# Patient Record
Sex: Female | Born: 1980
Health system: Southern US, Community
[De-identification: ages and names within clinical notes are randomized; demographics above are authoritative.]

## PROBLEM LIST (undated history)

## (undated) DIAGNOSIS — E039 Hypothyroidism, unspecified: Secondary | ICD-10-CM

## (undated) DIAGNOSIS — Z8571 Personal history of Hodgkin lymphoma: Secondary | ICD-10-CM

## (undated) DIAGNOSIS — Z9289 Personal history of other medical treatment: Secondary | ICD-10-CM

## (undated) DIAGNOSIS — Z853 Personal history of malignant neoplasm of breast: Secondary | ICD-10-CM

## (undated) DIAGNOSIS — G43909 Migraine, unspecified, not intractable, without status migrainosus: Secondary | ICD-10-CM

## (undated) DIAGNOSIS — I10 Essential (primary) hypertension: Secondary | ICD-10-CM

## (undated) DIAGNOSIS — N649 Disorder of breast, unspecified: Secondary | ICD-10-CM

## (undated) DIAGNOSIS — E559 Vitamin D deficiency, unspecified: Secondary | ICD-10-CM

## (undated) DIAGNOSIS — I499 Cardiac arrhythmia, unspecified: Secondary | ICD-10-CM

## (undated) DIAGNOSIS — Z9221 Personal history of antineoplastic chemotherapy: Secondary | ICD-10-CM

## (undated) HISTORY — PX: WISDOM TOOTH EXTRACTION: SHX21

## (undated) HISTORY — DX: Disorder of breast, unspecified: N64.9

## (undated) HISTORY — DX: Vitamin D deficiency, unspecified: E55.9

## (undated) HISTORY — DX: Hypothyroidism, unspecified: E03.9

---
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
## 2009-04-06 ENCOUNTER — Other Ambulatory Visit: Admission: RE | Admit: 2009-04-06 | Discharge: 2009-04-06 | Payer: Self-pay | Admitting: Obstetrics and Gynecology

## 2010-04-13 ENCOUNTER — Other Ambulatory Visit: Admission: RE | Admit: 2010-04-13 | Discharge: 2010-04-13 | Payer: Self-pay | Admitting: Obstetrics and Gynecology

## 2011-06-29 ENCOUNTER — Other Ambulatory Visit: Payer: Self-pay | Admitting: Adult Health

## 2011-06-29 ENCOUNTER — Other Ambulatory Visit (HOSPITAL_COMMUNITY)
Admission: RE | Admit: 2011-06-29 | Discharge: 2011-06-29 | Disposition: A | Discharge status: Home / Self Care | Payer: PRIVATE HEALTH INSURANCE | Source: Ambulatory Visit | Attending: Obstetrics and Gynecology | Admitting: Obstetrics and Gynecology

## 2011-06-29 DIAGNOSIS — Z01419 Encounter for gynecological examination (general) (routine) without abnormal findings: Secondary | ICD-10-CM | POA: Insufficient documentation

## 2011-07-05 ENCOUNTER — Ambulatory Visit (HOSPITAL_COMMUNITY)
Admission: RE | Admit: 2011-07-05 | Discharge: 2011-07-05 | Disposition: A | Discharge status: Home / Self Care | Payer: PRIVATE HEALTH INSURANCE | Source: Ambulatory Visit | Attending: Adult Health | Admitting: Adult Health

## 2011-07-05 DIAGNOSIS — Z8571 Personal history of Hodgkin lymphoma: Secondary | ICD-10-CM | POA: Insufficient documentation

## 2011-07-05 DIAGNOSIS — E042 Nontoxic multinodular goiter: Secondary | ICD-10-CM | POA: Insufficient documentation

## 2011-07-13 ENCOUNTER — Other Ambulatory Visit: Payer: Self-pay | Admitting: Adult Health

## 2011-07-13 DIAGNOSIS — E042 Nontoxic multinodular goiter: Secondary | ICD-10-CM

## 2011-07-16 ENCOUNTER — Encounter (HOSPITAL_COMMUNITY)
Admission: RE | Admit: 2011-07-16 | Discharge: 2011-07-16 | Disposition: A | Discharge status: Home / Self Care | Payer: PRIVATE HEALTH INSURANCE | Source: Ambulatory Visit | Attending: Adult Health | Admitting: Adult Health

## 2011-07-16 ENCOUNTER — Encounter (HOSPITAL_COMMUNITY): Payer: Self-pay

## 2011-07-16 DIAGNOSIS — E0789 Other specified disorders of thyroid: Secondary | ICD-10-CM | POA: Insufficient documentation

## 2011-07-16 DIAGNOSIS — Z8571 Personal history of Hodgkin lymphoma: Secondary | ICD-10-CM | POA: Insufficient documentation

## 2011-07-16 DIAGNOSIS — E042 Nontoxic multinodular goiter: Secondary | ICD-10-CM

## 2011-07-16 DIAGNOSIS — E039 Hypothyroidism, unspecified: Secondary | ICD-10-CM | POA: Insufficient documentation

## 2011-07-16 DIAGNOSIS — E049 Nontoxic goiter, unspecified: Secondary | ICD-10-CM | POA: Insufficient documentation

## 2011-07-16 MED ORDER — SODIUM PERTECHNETATE TC 99M INJECTION
10.0000 | Freq: Once | INTRAVENOUS | Status: AC | PRN
  Administered 2011-07-16: 10.4 via INTRAVENOUS

## 2011-07-17 ENCOUNTER — Other Ambulatory Visit: Payer: Self-pay | Admitting: Adult Health

## 2011-07-17 DIAGNOSIS — E041 Nontoxic single thyroid nodule: Secondary | ICD-10-CM

## 2011-07-18 ENCOUNTER — Other Ambulatory Visit: Payer: Self-pay | Admitting: Obstetrics and Gynecology

## 2011-07-18 ENCOUNTER — Other Ambulatory Visit: Payer: Self-pay | Admitting: Adult Health

## 2011-07-18 DIAGNOSIS — E041 Nontoxic single thyroid nodule: Secondary | ICD-10-CM

## 2011-07-19 ENCOUNTER — Other Ambulatory Visit: Payer: Self-pay | Admitting: Adult Health

## 2011-07-19 ENCOUNTER — Other Ambulatory Visit (HOSPITAL_COMMUNITY): Payer: Self-pay | Admitting: Adult Health

## 2011-07-19 ENCOUNTER — Ambulatory Visit (HOSPITAL_COMMUNITY)
Admission: RE | Admit: 2011-07-19 | Discharge: 2011-07-19 | Disposition: A | Discharge status: Home / Self Care | Payer: PRIVATE HEALTH INSURANCE | Source: Ambulatory Visit | Attending: Adult Health | Admitting: Adult Health

## 2011-07-19 DIAGNOSIS — E041 Nontoxic single thyroid nodule: Secondary | ICD-10-CM

## 2011-07-19 DIAGNOSIS — E049 Nontoxic goiter, unspecified: Secondary | ICD-10-CM | POA: Insufficient documentation

## 2011-07-19 NOTE — Procedures (Signed)
PreOperative Dx: Bilateral thyroid nodules Postoperative Dx: Bilateral thyroid nodules Procedure:   US guided FNA of bilateral thyroid nodules Radiologist:  Tyron Russell Anesthesia:  2 ml of 2% lidocaine Specimen:  FNA x 3 RIGHT thyroid nodule, FNA x 3 LEFT thyroid nodule EBL:   None Complications: None

## 2011-08-01 DIAGNOSIS — I5189 Other ill-defined heart diseases: Secondary | ICD-10-CM | POA: Insufficient documentation

## 2011-08-14 LAB — OB RESULTS CONSOLE HGB/HCT, BLOOD: HCT: 32 %

## 2011-08-14 LAB — OB RESULTS CONSOLE PLATELET COUNT: Platelets: 405 10*3/uL

## 2011-08-14 LAB — OB RESULTS CONSOLE HIV ANTIBODY (ROUTINE TESTING): HIV: NONREACTIVE

## 2011-08-14 LAB — OB RESULTS CONSOLE RPR: RPR: NONREACTIVE

## 2011-08-24 DIAGNOSIS — E042 Nontoxic multinodular goiter: Secondary | ICD-10-CM | POA: Insufficient documentation

## 2012-03-26 LAB — OB RESULTS CONSOLE GC/CHLAMYDIA
Chlamydia: NEGATIVE
Gonorrhea: NEGATIVE

## 2012-03-26 LAB — OB RESULTS CONSOLE RPR: RPR: NONREACTIVE

## 2012-03-26 LAB — OB RESULTS CONSOLE ANTIBODY SCREEN: Antibody Screen: NEGATIVE

## 2012-03-26 LAB — OB RESULTS CONSOLE HIV ANTIBODY (ROUTINE TESTING): HIV: NONREACTIVE

## 2012-03-26 LAB — OB RESULTS CONSOLE PLATELET COUNT: Platelets: 433 10*3/uL

## 2012-03-26 LAB — OB RESULTS CONSOLE HGB/HCT, BLOOD: Hemoglobin: 10.4 g/dL

## 2012-07-21 DIAGNOSIS — H179 Unspecified corneal scar and opacity: Secondary | ICD-10-CM | POA: Insufficient documentation

## 2012-07-21 DIAGNOSIS — Z8619 Personal history of other infectious and parasitic diseases: Secondary | ICD-10-CM | POA: Insufficient documentation

## 2012-07-21 DIAGNOSIS — H52219 Irregular astigmatism, unspecified eye: Secondary | ICD-10-CM | POA: Insufficient documentation

## 2012-08-13 LAB — OB RESULTS CONSOLE HGB/HCT, BLOOD: Hemoglobin: 10.4 g/dL

## 2012-08-13 LAB — OB RESULTS CONSOLE TSH: TSH: 1.374

## 2012-09-24 ENCOUNTER — Ambulatory Visit (INDEPENDENT_AMBULATORY_CARE_PROVIDER_SITE_OTHER): Payer: PRIVATE HEALTH INSURANCE | Admitting: Advanced Practice Midwife

## 2012-09-24 ENCOUNTER — Encounter: Payer: Self-pay | Admitting: Advanced Practice Midwife

## 2012-09-24 VITALS — BP 138/80 | Wt 182.0 lb

## 2012-09-24 DIAGNOSIS — IMO0002 Reserved for concepts with insufficient information to code with codable children: Secondary | ICD-10-CM

## 2012-09-24 DIAGNOSIS — O99280 Endocrine, nutritional and metabolic diseases complicating pregnancy, unspecified trimester: Secondary | ICD-10-CM

## 2012-09-24 DIAGNOSIS — O09299 Supervision of pregnancy with other poor reproductive or obstetric history, unspecified trimester: Secondary | ICD-10-CM

## 2012-09-24 DIAGNOSIS — Z131 Encounter for screening for diabetes mellitus: Secondary | ICD-10-CM

## 2012-09-24 DIAGNOSIS — O09819 Supervision of pregnancy resulting from assisted reproductive technology, unspecified trimester: Secondary | ICD-10-CM

## 2012-09-24 DIAGNOSIS — Z3490 Encounter for supervision of normal pregnancy, unspecified, unspecified trimester: Secondary | ICD-10-CM

## 2012-09-24 DIAGNOSIS — Z331 Pregnant state, incidental: Secondary | ICD-10-CM

## 2012-09-24 LAB — POCT URINALYSIS DIPSTICK
Blood, UA: NEGATIVE
Ketones, UA: NEGATIVE
Leukocytes, UA: NEGATIVE
Protein, UA: NEGATIVE

## 2012-09-24 NOTE — Progress Notes (Signed)
Drank fruit punch right before coming.  CBS 118.    No c/o at this time.  Routine questions about pregnancy andswered.  F/U in 2 weeks for LROB.

## 2012-09-25 ENCOUNTER — Encounter: Payer: Self-pay | Admitting: Advanced Practice Midwife

## 2012-09-25 DIAGNOSIS — E039 Hypothyroidism, unspecified: Secondary | ICD-10-CM | POA: Insufficient documentation

## 2012-10-07 ENCOUNTER — Encounter: Payer: PRIVATE HEALTH INSURANCE | Admitting: Women's Health

## 2012-10-08 ENCOUNTER — Encounter: Payer: Self-pay | Admitting: *Deleted

## 2012-10-09 ENCOUNTER — Ambulatory Visit (INDEPENDENT_AMBULATORY_CARE_PROVIDER_SITE_OTHER): Payer: PRIVATE HEALTH INSURANCE | Admitting: Advanced Practice Midwife

## 2012-10-09 ENCOUNTER — Encounter: Payer: PRIVATE HEALTH INSURANCE | Admitting: Advanced Practice Midwife

## 2012-10-09 ENCOUNTER — Encounter: Payer: Self-pay | Admitting: Advanced Practice Midwife

## 2012-10-09 VITALS — BP 118/68 | Ht 64.0 in | Wt 184.2 lb

## 2012-10-09 DIAGNOSIS — O09299 Supervision of pregnancy with other poor reproductive or obstetric history, unspecified trimester: Secondary | ICD-10-CM

## 2012-10-09 DIAGNOSIS — Z3483 Encounter for supervision of other normal pregnancy, third trimester: Secondary | ICD-10-CM

## 2012-10-09 DIAGNOSIS — O09819 Supervision of pregnancy resulting from assisted reproductive technology, unspecified trimester: Secondary | ICD-10-CM

## 2012-10-09 DIAGNOSIS — Z3480 Encounter for supervision of other normal pregnancy, unspecified trimester: Secondary | ICD-10-CM | POA: Insufficient documentation

## 2012-10-09 DIAGNOSIS — IMO0002 Reserved for concepts with insufficient information to code with codable children: Secondary | ICD-10-CM

## 2012-10-09 DIAGNOSIS — E079 Disorder of thyroid, unspecified: Secondary | ICD-10-CM

## 2012-10-09 DIAGNOSIS — O99280 Endocrine, nutritional and metabolic diseases complicating pregnancy, unspecified trimester: Secondary | ICD-10-CM

## 2012-10-09 LAB — POCT URINALYSIS DIPSTICK
Glucose, UA: NEGATIVE
Ketones, UA: NEGATIVE

## 2012-10-09 NOTE — Progress Notes (Signed)
No c/o at this time.  Routine questions about pregnancy answered.  F/U in 1 weeks for obv and GBS.

## 2012-10-09 NOTE — Progress Notes (Signed)
Pressure in lower pelvic area.

## 2012-10-13 ENCOUNTER — Encounter: Payer: PRIVATE HEALTH INSURANCE | Admitting: Women's Health

## 2012-10-17 ENCOUNTER — Encounter: Payer: PRIVATE HEALTH INSURANCE | Admitting: Obstetrics and Gynecology

## 2012-10-17 ENCOUNTER — Encounter: Payer: Self-pay | Admitting: Obstetrics and Gynecology

## 2012-10-17 ENCOUNTER — Ambulatory Visit (INDEPENDENT_AMBULATORY_CARE_PROVIDER_SITE_OTHER): Payer: PRIVATE HEALTH INSURANCE | Admitting: Obstetrics and Gynecology

## 2012-10-17 VITALS — BP 138/80 | Wt 186.8 lb

## 2012-10-17 DIAGNOSIS — O99810 Abnormal glucose complicating pregnancy: Secondary | ICD-10-CM

## 2012-10-17 DIAGNOSIS — N898 Other specified noninflammatory disorders of vagina: Secondary | ICD-10-CM

## 2012-10-17 DIAGNOSIS — Z3483 Encounter for supervision of other normal pregnancy, third trimester: Secondary | ICD-10-CM

## 2012-10-17 DIAGNOSIS — E039 Hypothyroidism, unspecified: Secondary | ICD-10-CM

## 2012-10-17 DIAGNOSIS — O09819 Supervision of pregnancy resulting from assisted reproductive technology, unspecified trimester: Secondary | ICD-10-CM

## 2012-10-17 DIAGNOSIS — E079 Disorder of thyroid, unspecified: Secondary | ICD-10-CM

## 2012-10-17 DIAGNOSIS — O09299 Supervision of pregnancy with other poor reproductive or obstetric history, unspecified trimester: Secondary | ICD-10-CM

## 2012-10-17 LAB — POCT URINALYSIS DIPSTICK
Glucose, UA: NEGATIVE
Ketones, UA: NEGATIVE
Nitrite, UA: NEGATIVE

## 2012-10-17 NOTE — Addendum Note (Signed)
Addended by: Colen Darling on: 10/17/2012 12:34 PM   Modules accepted: Orders

## 2012-10-17 NOTE — Progress Notes (Signed)
Seeing Kim next week. Would rather wait and have GBS collected then.

## 2012-10-17 NOTE — Progress Notes (Signed)
37.0 wk good fm, No C/O. A: routine pnc  P GBS next wk.

## 2012-10-18 LAB — URINALYSIS
Ketones, ur: NEGATIVE mg/dL
Nitrite: NEGATIVE
Specific Gravity, Urine: 1.018 (ref 1.005–1.030)
Urobilinogen, UA: 0.2 mg/dL (ref 0.0–1.0)
pH: 7 (ref 5.0–8.0)

## 2012-10-19 LAB — URINE CULTURE
Colony Count: NO GROWTH
Organism ID, Bacteria: NO GROWTH

## 2012-10-20 ENCOUNTER — Encounter: Payer: Self-pay | Admitting: Women's Health

## 2012-10-20 ENCOUNTER — Ambulatory Visit (INDEPENDENT_AMBULATORY_CARE_PROVIDER_SITE_OTHER): Payer: PRIVATE HEALTH INSURANCE | Admitting: Women's Health

## 2012-10-20 VITALS — BP 120/76 | Wt 187.0 lb

## 2012-10-20 DIAGNOSIS — E079 Disorder of thyroid, unspecified: Secondary | ICD-10-CM

## 2012-10-20 DIAGNOSIS — R809 Proteinuria, unspecified: Secondary | ICD-10-CM

## 2012-10-20 DIAGNOSIS — O139 Gestational [pregnancy-induced] hypertension without significant proteinuria, unspecified trimester: Secondary | ICD-10-CM

## 2012-10-20 DIAGNOSIS — Z3483 Encounter for supervision of other normal pregnancy, third trimester: Secondary | ICD-10-CM

## 2012-10-20 DIAGNOSIS — R319 Hematuria, unspecified: Secondary | ICD-10-CM

## 2012-10-20 DIAGNOSIS — O09299 Supervision of pregnancy with other poor reproductive or obstetric history, unspecified trimester: Secondary | ICD-10-CM

## 2012-10-20 DIAGNOSIS — O09819 Supervision of pregnancy resulting from assisted reproductive technology, unspecified trimester: Secondary | ICD-10-CM

## 2012-10-20 LAB — POCT URINALYSIS DIPSTICK
Blood, UA: 3
Leukocytes, UA: NEGATIVE
Nitrite, UA: NEGATIVE
Protein, UA: 1

## 2012-10-20 MED ORDER — CEPHALEXIN 500 MG PO CAPS: 500.0000 mg | ORAL_CAPSULE | Freq: Four times a day (QID) | ORAL | Status: DC

## 2012-10-20 NOTE — Progress Notes (Signed)
Is having a lot of pelvic pressure and dysuria. Reports good fm. Denies uc's, lof, vb, urinary frequency, urgency, hesitancy, fever/chills, n/v. Denies ha, ruq/epigastric pain, n/v.  States she has been seeing spots occasionally for the past 4 months- no recent changes.  No other complaints.  All questions answered. GBS obtained today. Reviewed labor s/s, fetal kick counts, and pre-e warning s/s. Will send urine for ua c&s and treat w/ keflex 500mg  qid x 7d

## 2012-10-20 NOTE — Patient Instructions (Signed)
Braxton Hicks Contractions  Pregnancy is commonly associated with contractions of the uterus throughout the pregnancy. Towards the end of pregnancy (32 to 34 weeks), these contractions (Braxton Hicks) can develop more often and may become more forceful. This is not true labor because these contractions do not result in opening (dilatation) and thinning of the cervix. They are sometimes difficult to tell apart from true labor because these contractions can be forceful and people have different pain tolerances. You should not feel embarrassed if you go to the hospital with false labor. Sometimes, the only way to tell if you are in true labor is for your caregiver to follow the changes in the cervix.  How to tell the difference between true and false labor:  · False labor.  · The contractions of false labor are usually shorter, irregular and not as hard as those of true labor.  · They are often felt in the front of the lower abdomen and in the groin.  · They may leave with walking around or changing positions while lying down.  · They get weaker and are shorter lasting as time goes on.  · These contractions are usually irregular.  · They do not usually become progressively stronger, regular and closer together as with true labor.  · True labor.  · Contractions in true labor last 30 to 70 seconds, become very regular, usually become more intense, and increase in frequency.  · They do not go away with walking.  · The discomfort is usually felt in the top of the uterus and spreads to the lower abdomen and low back.  · True labor can be determined by your caregiver with an exam. This will show that the cervix is dilating and getting thinner.  If there are no prenatal problems or other health problems associated with the pregnancy, it is completely safe to be sent home with false labor and await the onset of true labor.  HOME CARE INSTRUCTIONS   · Keep up with your usual exercises and instructions.  · Take medications as  directed.  · Keep your regular prenatal appointment.  · Eat and drink lightly if you think you are going into labor.  · If BH contractions are making you uncomfortable:  · Change your activity position from lying down or resting to walking/walking to resting.  · Sit and rest in a tub of warm water.  · Drink 2 to 3 glasses of water. Dehydration may cause B-H contractions.  · Do slow and deep breathing several times an hour.  SEEK IMMEDIATE MEDICAL CARE IF:   · Your contractions continue to become stronger, more regular, and closer together.  · You have a gushing, burst or leaking of fluid from the vagina.  · An oral temperature above 102° F (38.9° C) develops.  · You have passage of blood-tinged mucus.  · You develop vaginal bleeding.  · You develop continuous belly (abdominal) pain.  · You have low back pain that you never had before.  · You feel the baby's head pushing down causing pelvic pressure.  · The baby is not moving as much as it used to.  Document Released: 06/25/2005 Document Revised: 09/17/2011 Document Reviewed: 12/17/2008  [employer]® Patient Information ©2013 [employer].

## 2012-10-20 NOTE — Progress Notes (Signed)
Having low pelvic pain this am.

## 2012-10-21 ENCOUNTER — Encounter: Payer: PRIVATE HEALTH INSURANCE | Admitting: Women's Health

## 2012-10-21 LAB — URINALYSIS
Glucose, UA: NEGATIVE mg/dL
Nitrite: NEGATIVE
Specific Gravity, Urine: >1.03 — ABNORMAL HIGH (ref 1.005–1.030)
pH: 6 (ref 5.0–8.0)

## 2012-10-21 LAB — OB RESULTS CONSOLE GBS: GBS: NEGATIVE

## 2012-10-21 LAB — OB RESULTS CONSOLE GC/CHLAMYDIA: Gonorrhea: NEGATIVE

## 2012-10-22 ENCOUNTER — Encounter: Payer: PRIVATE HEALTH INSURANCE | Admitting: Advanced Practice Midwife

## 2012-10-22 LAB — URINE CULTURE: Colony Count: 2000

## 2012-10-27 ENCOUNTER — Ambulatory Visit (INDEPENDENT_AMBULATORY_CARE_PROVIDER_SITE_OTHER): Payer: PRIVATE HEALTH INSURANCE | Admitting: Women's Health

## 2012-10-27 ENCOUNTER — Encounter: Payer: Self-pay | Admitting: *Deleted

## 2012-10-27 ENCOUNTER — Encounter: Payer: Self-pay | Admitting: Women's Health

## 2012-10-27 ENCOUNTER — Telehealth: Payer: Self-pay | Admitting: Women's Health

## 2012-10-27 VITALS — BP 140/88 | Wt 193.5 lb

## 2012-10-27 DIAGNOSIS — O99019 Anemia complicating pregnancy, unspecified trimester: Secondary | ICD-10-CM

## 2012-10-27 DIAGNOSIS — O1213 Gestational proteinuria, third trimester: Secondary | ICD-10-CM

## 2012-10-27 DIAGNOSIS — Z3483 Encounter for supervision of other normal pregnancy, third trimester: Secondary | ICD-10-CM

## 2012-10-27 DIAGNOSIS — IMO0002 Reserved for concepts with insufficient information to code with codable children: Secondary | ICD-10-CM

## 2012-10-27 DIAGNOSIS — O0990 Supervision of high risk pregnancy, unspecified, unspecified trimester: Secondary | ICD-10-CM

## 2012-10-27 DIAGNOSIS — O26839 Pregnancy related renal disease, unspecified trimester: Secondary | ICD-10-CM

## 2012-10-27 DIAGNOSIS — O0993 Supervision of high risk pregnancy, unspecified, third trimester: Secondary | ICD-10-CM

## 2012-10-27 LAB — POCT URINALYSIS DIPSTICK: Ketones, UA: NEGATIVE

## 2012-10-27 LAB — COMPREHENSIVE METABOLIC PANEL
AST: 22 U/L (ref 0–37)
BUN: 10 mg/dL (ref 6–23)
CO2: 22 mEq/L (ref 19–32)
Calcium: 9.1 mg/dL (ref 8.4–10.5)
Chloride: 110 mEq/L (ref 96–112)
Creat: 0.74 mg/dL (ref 0.50–1.10)
Total Bilirubin: 0.3 mg/dL (ref 0.3–1.2)

## 2012-10-27 LAB — CBC
HCT: 28 % — ABNORMAL LOW (ref 36.0–46.0)
MCH: 25.2 pg — ABNORMAL LOW (ref 26.0–34.0)
MCV: 78.4 fL (ref 78.0–100.0)
RDW: 15.9 % — ABNORMAL HIGH (ref 11.5–15.5)
WBC: 8.5 10*3/uL (ref 4.0–10.5)

## 2012-10-27 NOTE — Progress Notes (Signed)
Swelling in ankles, feet , and hands.

## 2012-10-27 NOTE — Progress Notes (Signed)
Reports good fm. Denies uc's, lof, vb, urinary frequency, urgency, hesitancy, or dysuria.  States keflex has helped a lot w/ prior uti s/s. Denies ha, scotomata, ruq/epigastric pain, or nausea.  Had one episode of vomiting Friday night- none since. Had heartburn that subsided w/ tums. 6lb wt gain in 1 week. Noticed legs/feet swelling yesterday at work. States she is coming out of work effective yesterday. Will get cbc, cmp, urine p/c ratio today and f/u on Thurs or earlier if needed. Reviewed pre-e warning s/s, labor s/s, and fetal kick counts.  All questions answered.

## 2012-10-27 NOTE — Patient Instructions (Signed)
Call/come to office or go to [facility] for headache not relieved by tylenol, visual changes- seeing spots, double, blurred vision, pain under your right breast or heartburn that doesn't go away with tums, etc., nausea and/or vomiting, or other concerns.    Preeclampsia and Eclampsia Preeclampsia is a condition of high blood pressure during pregnancy. It can happen at 20 weeks or later in pregnancy. If high blood pressure occurs in the second half of pregnancy with no other symptoms, it is called gestational hypertension and goes away after the baby is born. If any of the symptoms listed below develop with gestational hypertension, it is then called preeclampsia. Eclampsia (convulsions) may follow preeclampsia. This is one of the reasons for regular prenatal checkups. Early diagnosis and treatment are very important to prevent eclampsia. CAUSES  There is no known cause of preeclampsia/eclampsia in pregnancy. There are several known conditions that may put the pregnant woman at risk, such as:  The first pregnancy.  Having preeclampsia in a past pregnancy.  Having lasting (chronic) high blood pressure.  Having multiples (twins, triplets).  Being age 72 or older.  African American ethnic background.  Having kidney disease or diabetes.  Medical conditions such as lupus or blood diseases.  Being overweight (obese). SYMPTOMS   High blood pressure.  Headaches.  Sudden weight gain.  Swelling of hands, face, legs, and feet.  Protein in the urine.  Feeling sick to your stomach (nauseous) and throwing up (vomiting).  Vision problems (blurred or double vision).  Numbness in the face, arms, legs, and feet.  Dizziness.  Slurred speech.  Preeclampsia can cause growth retardation in the fetus.  Separation (abruption) of the placenta.  Not enough fluid in the amniotic sac (oligohydramnios).  Sensitivity to bright lights.  Belly (abdominal) pain. DIAGNOSIS  If protein is  found in the urine in the second half of pregnancy, this is considered preeclampsia. Other symptoms mentioned above may also be present. TREATMENT  It is necessary to treat this.  Your caregiver may prescribe bed rest early in this condition. Plenty of rest and salt restriction may be all that is needed.  Medicines may be necessary to lower blood pressure if the condition does not respond to more conservative measures.  In more severe cases, hospitalization may be needed:  For treatment of blood pressure.  To control fluid retention.  To monitor the baby to see if the condition is causing harm to the baby.  Hospitalization is the best way to treat the first sign of preeclampsia. This is so the mother and baby can be watched closely and blood tests can be done effectively and correctly.  If the condition becomes severe, it may be necessary to induce labor or to remove the infant by surgical means (cesarean section). The best cure for preeclampsia/eclampsia is to deliver the baby. Preeclampsia and eclampsia involve risks to mother and infant. Your caregiver will discuss these risks with you. Together, you can work out the best possible approach to your problems. Make sure you keep your prenatal visits as scheduled. Not keeping appointments could result in a chronic or permanent injury, pain, disability to you, and death or injury to you or your unborn baby. If there is any problem keeping the appointment, you must call to reschedule. HOME CARE INSTRUCTIONS   Keep your prenatal appointments and tests as scheduled.  Tell your caregiver if you have any of the above risk factors.  Get plenty of rest and sleep.  Eat a balanced diet that  is low in salt, and do not add salt to your food.  Avoid stressful situations.  Only take over-the-counter and prescriptions medicines for pain, discomfort, or fever as directed by your caregiver. SEEK IMMEDIATE MEDICAL CARE IF:   You develop severe  swelling anywhere in the body. This usually occurs in the legs.  You gain 5 lb/2.3 kg or more in a week.  You develop a severe headache, dizziness, problems with your vision, or confusion.  You have abdominal pain, nausea, or vomiting.  You have a seizure.  You have trouble moving any part of your body, or you develop numbness or problems speaking.  You have bruising or abnormal bleeding from anywhere in the body.  You develop a stiff neck.  You pass out. MAKE SURE YOU:   Understand these instructions.  Will watch your condition.  Will get help right away if you are not doing well or get worse. Document Released: 06/22/2000 Document Revised: 09/17/2011 Document Reviewed: 02/06/2008 [facility] Patient Information 2013 [place], [place].

## 2012-10-28 ENCOUNTER — Other Ambulatory Visit: Payer: Self-pay | Admitting: Women's Health

## 2012-10-28 DIAGNOSIS — O99013 Anemia complicating pregnancy, third trimester: Secondary | ICD-10-CM

## 2012-10-28 LAB — PROTEIN / CREATININE RATIO, URINE
Creatinine, Urine: 136.5 mg/dL
Protein Creatinine Ratio: 0.18 — ABNORMAL HIGH (ref <0.15)

## 2012-10-28 MED ORDER — FERROUS SULFATE 325 (65 FE) MG PO TABS: 325.0000 mg | ORAL_TABLET | Freq: Two times a day (BID) | ORAL | Status: DC

## 2012-10-30 ENCOUNTER — Ambulatory Visit (INDEPENDENT_AMBULATORY_CARE_PROVIDER_SITE_OTHER): Payer: PRIVATE HEALTH INSURANCE | Admitting: Advanced Practice Midwife

## 2012-10-30 ENCOUNTER — Encounter: Payer: Self-pay | Admitting: Advanced Practice Midwife

## 2012-10-30 VITALS — BP 148/88 | Wt 191.4 lb

## 2012-10-30 DIAGNOSIS — O09819 Supervision of pregnancy resulting from assisted reproductive technology, unspecified trimester: Secondary | ICD-10-CM

## 2012-10-30 DIAGNOSIS — Z3483 Encounter for supervision of other normal pregnancy, third trimester: Secondary | ICD-10-CM

## 2012-10-30 DIAGNOSIS — O09299 Supervision of pregnancy with other poor reproductive or obstetric history, unspecified trimester: Secondary | ICD-10-CM

## 2012-10-30 LAB — POCT URINALYSIS DIPSTICK
Leukocytes, UA: NEGATIVE
Nitrite, UA: NEGATIVE
Protein, UA: 1

## 2012-10-30 NOTE — Patient Instructions (Signed)

## 2012-10-30 NOTE — Progress Notes (Signed)
Here for f/u B/P/labs.  PR/CR ratio 0.15 4/21.  Today denies HA, visual changes, RUQ pain.  DTR's3+ without clonus.  Discussed with Dr. Despina Hidden.  Does not meet criteria for GHTN.  Will repeat PR/CR ratio and f/u Monday.  PreX s/s given to pt

## 2012-11-03 ENCOUNTER — Inpatient Hospital Stay (HOSPITAL_COMMUNITY)
Admission: AD | Admit: 2012-11-03 | Discharge: 2012-11-07 | DRG: 765 | Disposition: A | Discharge status: Home / Self Care | Payer: PRIVATE HEALTH INSURANCE | Source: Ambulatory Visit | Attending: Obstetrics and Gynecology | Admitting: Obstetrics and Gynecology

## 2012-11-03 ENCOUNTER — Encounter: Payer: Self-pay | Admitting: Women's Health

## 2012-11-03 ENCOUNTER — Encounter (HOSPITAL_COMMUNITY): Payer: Self-pay | Admitting: *Deleted

## 2012-11-03 ENCOUNTER — Ambulatory Visit (INDEPENDENT_AMBULATORY_CARE_PROVIDER_SITE_OTHER): Payer: PRIVATE HEALTH INSURANCE | Admitting: Women's Health

## 2012-11-03 VITALS — BP 156/86 | Wt 192.0 lb

## 2012-11-03 DIAGNOSIS — O0993 Supervision of high risk pregnancy, unspecified, third trimester: Secondary | ICD-10-CM

## 2012-11-03 DIAGNOSIS — O1414 Severe pre-eclampsia complicating childbirth: Principal | ICD-10-CM | POA: Diagnosis present

## 2012-11-03 DIAGNOSIS — Z98891 History of uterine scar from previous surgery: Secondary | ICD-10-CM

## 2012-11-03 DIAGNOSIS — O99019 Anemia complicating pregnancy, unspecified trimester: Secondary | ICD-10-CM

## 2012-11-03 DIAGNOSIS — O09819 Supervision of pregnancy resulting from assisted reproductive technology, unspecified trimester: Secondary | ICD-10-CM

## 2012-11-03 DIAGNOSIS — O1493 Unspecified pre-eclampsia, third trimester: Secondary | ICD-10-CM

## 2012-11-03 DIAGNOSIS — O139 Gestational [pregnancy-induced] hypertension without significant proteinuria, unspecified trimester: Secondary | ICD-10-CM

## 2012-11-03 DIAGNOSIS — O133 Gestational [pregnancy-induced] hypertension without significant proteinuria, third trimester: Secondary | ICD-10-CM

## 2012-11-03 DIAGNOSIS — O324XX0 Maternal care for high head at term, not applicable or unspecified: Secondary | ICD-10-CM | POA: Diagnosis present

## 2012-11-03 DIAGNOSIS — E079 Disorder of thyroid, unspecified: Secondary | ICD-10-CM | POA: Diagnosis present

## 2012-11-03 DIAGNOSIS — E039 Hypothyroidism, unspecified: Secondary | ICD-10-CM | POA: Diagnosis present

## 2012-11-03 DIAGNOSIS — IMO0002 Reserved for concepts with insufficient information to code with codable children: Secondary | ICD-10-CM

## 2012-11-03 DIAGNOSIS — Z3483 Encounter for supervision of other normal pregnancy, third trimester: Secondary | ICD-10-CM

## 2012-11-03 DIAGNOSIS — O0990 Supervision of high risk pregnancy, unspecified, unspecified trimester: Secondary | ICD-10-CM

## 2012-11-03 DIAGNOSIS — O99284 Endocrine, nutritional and metabolic diseases complicating childbirth: Secondary | ICD-10-CM | POA: Diagnosis present

## 2012-11-03 LAB — COMPREHENSIVE METABOLIC PANEL
ALT: 20 U/L (ref 0–35)
AST: 24 U/L (ref 0–37)
CO2: 21 mEq/L (ref 19–32)
Calcium: 9.2 mg/dL (ref 8.4–10.5)
Creatinine, Ser: 0.73 mg/dL (ref 0.50–1.10)
GFR calc Af Amer: >90 mL/min (ref >90)
GFR calc non Af Amer: >90 mL/min (ref >90)
Sodium: 136 mEq/L (ref 135–145)
Total Protein: 6 g/dL (ref 6.0–8.3)

## 2012-11-03 LAB — TYPE AND SCREEN
ABO/RH(D): A POS
Antibody Screen: NEGATIVE

## 2012-11-03 LAB — POCT URINALYSIS DIPSTICK
Glucose, UA: NEGATIVE
Ketones, UA: NEGATIVE

## 2012-11-03 LAB — CBC
Hemoglobin: 9.8 g/dL — ABNORMAL LOW (ref 12.0–15.0)
Platelets: 236 10*3/uL (ref 150–400)
RBC: 3.81 MIL/uL — ABNORMAL LOW (ref 3.87–5.11)
WBC: 10.5 10*3/uL (ref 4.0–10.5)

## 2012-11-03 MED ORDER — OXYTOCIN 40 UNITS IN LACTATED RINGERS INFUSION - SIMPLE MED: 62.5000 mL/h | INTRAVENOUS | Status: DC

## 2012-11-03 MED ORDER — LACTATED RINGERS IV SOLN: 500.0000 mL | INTRAVENOUS | Status: DC | PRN

## 2012-11-03 MED ORDER — OXYCODONE-ACETAMINOPHEN 5-325 MG PO TABS: 1.0000 | ORAL_TABLET | ORAL | Status: DC | PRN

## 2012-11-03 MED ORDER — LACTATED RINGERS IV SOLN
INTRAVENOUS | Status: DC
  Administered 2012-11-03 – 2012-11-04 (×2): via INTRAVENOUS

## 2012-11-03 MED ORDER — ONDANSETRON HCL 4 MG/2ML IJ SOLN: 4.0000 mg | Freq: Four times a day (QID) | INTRAMUSCULAR | Status: DC | PRN

## 2012-11-03 MED ORDER — IBUPROFEN 600 MG PO TABS: 600.0000 mg | ORAL_TABLET | Freq: Four times a day (QID) | ORAL | Status: DC | PRN

## 2012-11-03 MED ORDER — OXYTOCIN 40 UNITS IN LACTATED RINGERS INFUSION - SIMPLE MED
1.0000 m[IU]/min | INTRAVENOUS | Status: DC
  Administered 2012-11-03: 2 m[IU]/min via INTRAVENOUS
  Filled 2012-11-03: qty 1000

## 2012-11-03 MED ORDER — NALBUPHINE SYRINGE 5 MG/0.5 ML
10.0000 mg | INJECTION | INTRAMUSCULAR | Status: DC | PRN
  Administered 2012-11-03: 10 mg via INTRAVENOUS
  Filled 2012-11-03: qty 1

## 2012-11-03 MED ORDER — ACETAMINOPHEN 325 MG PO TABS: 650.0000 mg | ORAL_TABLET | ORAL | Status: DC | PRN

## 2012-11-03 MED ORDER — LIDOCAINE HCL (PF) 1 % IJ SOLN
30.0000 mL | INTRAMUSCULAR | Status: DC | PRN
  Filled 2012-11-03: qty 30

## 2012-11-03 MED ORDER — OXYTOCIN BOLUS FROM INFUSION: 500.0000 mL | INTRAVENOUS | Status: DC

## 2012-11-03 MED ORDER — TERBUTALINE SULFATE 1 MG/ML IJ SOLN: 0.2500 mg | Freq: Once | INTRAMUSCULAR | Status: AC | PRN

## 2012-11-03 MED ORDER — LEVOTHYROXINE SODIUM 50 MCG PO TABS
50.0000 ug | ORAL_TABLET | Freq: Every day | ORAL | Status: DC
  Administered 2012-11-04 – 2012-11-05 (×2): 50 ug via ORAL
  Filled 2012-11-03 (×4): qty 1

## 2012-11-03 MED ORDER — CITRIC ACID-SODIUM CITRATE 334-500 MG/5ML PO SOLN
30.0000 mL | ORAL | Status: DC | PRN
  Administered 2012-11-04: 30 mL via ORAL
  Filled 2012-11-03: qty 15

## 2012-11-03 NOTE — Progress Notes (Signed)
Reports good fm. Denies regular/painful uc's, lof, vb, urinary frequency, urgency, hesitancy, or dysuria.  Has been having irregular uc's all day, q 18min-1+hr. Denies ha, scotomata, ruq/epigastric pain, n/v.  Discussed w/ JVF- to [facility] for IOL d/t GHTN vs. Pre-e. Notified Truitt Merle and L&D charge RN.

## 2012-11-03 NOTE — H&P (Signed)
Bonnie Brown is a 32 y.o. female G2P0010 at [redacted]w[redacted]d presenting for IOL for GHTN. Patient seen in clinic today with elevated BP for second time (148/88 on 4/24 --> 156/86 today). Denies bleeding, loss of fluid. Some irregular contractions. Denies headache, vision changes, RUQ pain. Baby moving well.   Care at [facility]. No complications this pregnancy except hypothyroidism and recent elevated BP. No prior deliveries. Normal ultrasound, genetic screen, and normal GTT.  History OB History   Grav Para Term Preterm Abortions TAB SAB Ect Mult Living   2    1  1         Past Medical History  Diagnosis Date  . Hypothyroidism   . Cancer 1995    Hodgin's   Past Surgical History  Procedure Laterality Date  . Lymph node biopsy  1995   Family History: family history includes Cancer in her other; Coronary artery disease in her other; Diabetes in her other; Hypertension in her other; and Stroke in her other. Social History:  reports that she has never smoked. She has never used smokeless tobacco. She reports that she does not drink alcohol or use illicit drugs.   Prenatal Transfer Tool  Maternal Diabetes: No Genetic Screening: Normal Maternal Ultrasounds/Referrals: Normal Fetal Ultrasounds or other Referrals:  None Maternal Substance Abuse:  No Significant Maternal Medications:  Meds include: Syntroid Significant Maternal Lab Results:  Lab values include: Group B Strep negative Other Comments:  None  ROS  Pertinent pos and neg listed in HPI  Dilation: 2.5 Effacement (%): 70 Station: -2 Exam by:: Dr.Navjot Pilgrim Blood pressure 158/89, pulse 98, temperature 98.1 F (36.7 C), temperature source Oral, resp. rate 16, height 5\' 4"  (1.626 m), weight 87.091 kg (192 lb), last menstrual period 02/01/2012. Maternal Exam:  Abdomen: Fetal presentation: vertex  Introitus: Normal vulva. Normal vagina.  Ferning test: not done.   Pelvis: adequate for delivery.   Cervix: Cervix evaluated by digital  exam.     Fetal Exam Fetal Monitor Review: Mode: ultrasound.   Baseline rate: 130.  Variability: moderate (6-25 bpm).   Pattern: accelerations present and no decelerations.    Fetal State Assessment: Category I - tracings are normal.     Physical Exam  Constitutional: She is oriented to person, place, and time. She appears well-developed and well-nourished. No distress.  HENT:  Head: Atraumatic.  Eyes: Conjunctivae and EOM are normal.  Neck: Normal range of motion. Neck supple.  Cardiovascular: Normal rate, regular rhythm and normal heart sounds.   Respiratory: Effort normal and breath sounds normal. No respiratory distress.  GI: Soft. There is no tenderness. There is no rebound and no guarding.  Musculoskeletal: Normal range of motion. She exhibits no edema and no tenderness.  Neurological: She is alert and oriented to person, place, and time.  Skin: Skin is warm and dry.    Cervix:  2.5/80/-1  Prenatal labs: ABO, Rh: A/Positive/-- (09/18 0000) Antibody: Negative (09/18 0000) Rubella: Immune (09/18 0000) RPR: Nonreactive (02/05 0000)  HBsAg: Negative (09/18 0000)  HIV: Non-reactive (02/05 0000)  GBS: Negative (04/15 0000)   Assessment/Plan: 32 y.o. G2P0010 at [redacted]w[redacted]d with GHTN - Admit for induction of labor.   - PIH labs - mag if BP continues close to severe range and/or CMP/CBC or urine protein:creatinine abnl. - Foley bulb and low dose pitocin - Anticipate SVD  Napoleon Form 11/03/2012, 9:27 PM

## 2012-11-04 ENCOUNTER — Inpatient Hospital Stay (HOSPITAL_COMMUNITY): Payer: PRIVATE HEALTH INSURANCE | Admitting: Anesthesiology

## 2012-11-04 ENCOUNTER — Encounter (HOSPITAL_COMMUNITY): Payer: Self-pay | Admitting: Anesthesiology

## 2012-11-04 ENCOUNTER — Encounter (HOSPITAL_COMMUNITY)
Admission: AD | Disposition: A | Discharge status: Home / Self Care | Payer: Self-pay | Source: Ambulatory Visit | Attending: Obstetrics and Gynecology

## 2012-11-04 DIAGNOSIS — O1414 Severe pre-eclampsia complicating childbirth: Secondary | ICD-10-CM

## 2012-11-04 LAB — CBC
HCT: 30.6 % — ABNORMAL LOW (ref 36.0–46.0)
MCH: 25.3 pg — ABNORMAL LOW (ref 26.0–34.0)
MCHC: 31 g/dL (ref 30.0–36.0)
MCV: 81.6 fL (ref 78.0–100.0)
Platelets: 249 10*3/uL (ref 150–400)
RDW: 16.8 % — ABNORMAL HIGH (ref 11.5–15.5)

## 2012-11-04 LAB — ABO/RH: ABO/RH(D): A POS

## 2012-11-04 LAB — PROTEIN / CREATININE RATIO, URINE
Protein Creatinine Ratio: 0.86 — ABNORMAL HIGH (ref 0.00–0.15)
Total Protein, Urine: 131 mg/dL

## 2012-11-04 LAB — RPR: RPR Ser Ql: NONREACTIVE

## 2012-11-04 SURGERY — Surgical Case
Anesthesia: Epidural | Site: Abdomen | Wound class: Clean Contaminated

## 2012-11-04 MED ORDER — ONDANSETRON HCL 4 MG/2ML IJ SOLN: 4.0000 mg | INTRAMUSCULAR | Status: DC | PRN

## 2012-11-04 MED ORDER — HYDROMORPHONE HCL PF 1 MG/ML IJ SOLN
INTRAMUSCULAR | Status: DC | PRN
  Administered 2012-11-04: 1 mg via INTRAVENOUS

## 2012-11-04 MED ORDER — FENTANYL 2.5 MCG/ML BUPIVACAINE 1/10 % EPIDURAL INFUSION (WH - ANES)
14.0000 mL/h | INTRAMUSCULAR | Status: DC | PRN
  Administered 2012-11-04 (×3): 14 mL/h via EPIDURAL
  Filled 2012-11-04 (×4): qty 125

## 2012-11-04 MED ORDER — MENTHOL 3 MG MT LOZG
1.0000 | LOZENGE | OROMUCOSAL | Status: DC | PRN
  Filled 2012-11-04: qty 9

## 2012-11-04 MED ORDER — METOCLOPRAMIDE HCL 5 MG/ML IJ SOLN: 10.0000 mg | Freq: Three times a day (TID) | INTRAMUSCULAR | Status: DC | PRN

## 2012-11-04 MED ORDER — OXYTOCIN 10 UNIT/ML IJ SOLN
40.0000 [IU] | INTRAVENOUS | Status: DC | PRN
  Administered 2012-11-04: 40 [IU] via INTRAVENOUS

## 2012-11-04 MED ORDER — SODIUM CHLORIDE 0.9 % IR SOLN
Status: DC | PRN
  Administered 2012-11-04: 1000 mL

## 2012-11-04 MED ORDER — FENTANYL CITRATE 0.05 MG/ML IJ SOLN
INTRAMUSCULAR | Status: AC
  Filled 2012-11-04: qty 2

## 2012-11-04 MED ORDER — FENTANYL CITRATE 0.05 MG/ML IJ SOLN
25.0000 ug | INTRAMUSCULAR | Status: DC | PRN
  Administered 2012-11-04: 25 ug via INTRAVENOUS

## 2012-11-04 MED ORDER — SODIUM CHLORIDE 0.9 % IJ SOLN: 3.0000 mL | INTRAMUSCULAR | Status: DC | PRN

## 2012-11-04 MED ORDER — NALBUPHINE SYRINGE 5 MG/0.5 ML
5.0000 mg | INJECTION | INTRAMUSCULAR | Status: DC | PRN
  Filled 2012-11-04: qty 1

## 2012-11-04 MED ORDER — ONDANSETRON HCL 4 MG/2ML IJ SOLN
INTRAMUSCULAR | Status: AC
  Filled 2012-11-04: qty 2

## 2012-11-04 MED ORDER — WITCH HAZEL-GLYCERIN EX PADS: 1.0000 "application " | MEDICATED_PAD | CUTANEOUS | Status: DC | PRN

## 2012-11-04 MED ORDER — PHENYLEPHRINE HCL 10 MG/ML IJ SOLN
INTRAMUSCULAR | Status: DC | PRN
  Administered 2012-11-04 (×2): 80 ug via INTRAVENOUS
  Administered 2012-11-04: 40 ug via INTRAVENOUS
  Administered 2012-11-04: 80 ug via INTRAVENOUS
  Administered 2012-11-04 (×2): 40 ug via INTRAVENOUS
  Administered 2012-11-04: 80 ug via INTRAVENOUS

## 2012-11-04 MED ORDER — ONDANSETRON HCL 4 MG/2ML IJ SOLN
INTRAMUSCULAR | Status: DC | PRN
  Administered 2012-11-04: 4 mg via INTRAVENOUS

## 2012-11-04 MED ORDER — ZOLPIDEM TARTRATE 5 MG PO TABS: 5.0000 mg | ORAL_TABLET | Freq: Every evening | ORAL | Status: DC | PRN

## 2012-11-04 MED ORDER — TETANUS-DIPHTH-ACELL PERTUSSIS 5-2.5-18.5 LF-MCG/0.5 IM SUSP
0.5000 mL | Freq: Once | INTRAMUSCULAR | Status: DC
  Filled 2012-11-04: qty 0.5

## 2012-11-04 MED ORDER — MEPERIDINE HCL 25 MG/ML IJ SOLN: 6.2500 mg | INTRAMUSCULAR | Status: DC | PRN

## 2012-11-04 MED ORDER — EPHEDRINE 5 MG/ML INJ
10.0000 mg | INTRAVENOUS | Status: DC | PRN
  Filled 2012-11-04: qty 4

## 2012-11-04 MED ORDER — MAGNESIUM SULFATE BOLUS VIA INFUSION
4.0000 g | Freq: Once | INTRAVENOUS | Status: AC
  Administered 2012-11-04: 4 g via INTRAVENOUS
  Filled 2012-11-04: qty 500

## 2012-11-04 MED ORDER — CEFAZOLIN SODIUM-DEXTROSE 2-3 GM-% IV SOLR
INTRAVENOUS | Status: AC
  Filled 2012-11-04: qty 50

## 2012-11-04 MED ORDER — BUPIVACAINE HCL (PF) 0.5 % IJ SOLN
INTRAMUSCULAR | Status: AC
  Filled 2012-11-04: qty 30

## 2012-11-04 MED ORDER — PHENYLEPHRINE 40 MCG/ML (10ML) SYRINGE FOR IV PUSH (FOR BLOOD PRESSURE SUPPORT): 80.0000 ug | PREFILLED_SYRINGE | INTRAVENOUS | Status: DC | PRN

## 2012-11-04 MED ORDER — MAGNESIUM HYDROXIDE 400 MG/5ML PO SUSP: 30.0000 mL | ORAL | Status: DC | PRN

## 2012-11-04 MED ORDER — MAGNESIUM SULFATE 40 G IN LACTATED RINGERS - SIMPLE
2.0000 g/h | INTRAVENOUS | Status: DC
  Administered 2012-11-04: 2 g/h via INTRAVENOUS
  Filled 2012-11-04: qty 500

## 2012-11-04 MED ORDER — SCOPOLAMINE 1 MG/3DAYS TD PT72
MEDICATED_PATCH | TRANSDERMAL | Status: AC
  Filled 2012-11-04: qty 1

## 2012-11-04 MED ORDER — LIDOCAINE HCL (PF) 1 % IJ SOLN
INTRAMUSCULAR | Status: DC | PRN
  Administered 2012-11-04 (×2): 4 mL

## 2012-11-04 MED ORDER — NALOXONE HCL 0.4 MG/ML IJ SOLN: 0.4000 mg | INTRAMUSCULAR | Status: DC | PRN

## 2012-11-04 MED ORDER — SENNOSIDES-DOCUSATE SODIUM 8.6-50 MG PO TABS
2.0000 | ORAL_TABLET | Freq: Every day | ORAL | Status: DC
Start: 2012-11-04 — End: 2012-11-07
  Administered 2012-11-04 – 2012-11-05 (×2): 2 via ORAL

## 2012-11-04 MED ORDER — OXYTOCIN 40 UNITS IN LACTATED RINGERS INFUSION - SIMPLE MED: 125.0000 mL/h | INTRAVENOUS | Status: AC

## 2012-11-04 MED ORDER — MAGNESIUM SULFATE 40 G IN LACTATED RINGERS - SIMPLE
2.0000 g/h | INTRAVENOUS | Status: DC
  Administered 2012-11-05: 2 g/h via INTRAVENOUS
  Filled 2012-11-04: qty 500

## 2012-11-04 MED ORDER — DIPHENHYDRAMINE HCL 50 MG/ML IJ SOLN: 12.5000 mg | INTRAMUSCULAR | Status: DC | PRN

## 2012-11-04 MED ORDER — ONDANSETRON HCL 4 MG PO TABS: 4.0000 mg | ORAL_TABLET | ORAL | Status: DC | PRN

## 2012-11-04 MED ORDER — SIMETHICONE 80 MG PO CHEW
80.0000 mg | CHEWABLE_TABLET | ORAL | Status: DC | PRN
  Administered 2012-11-06 – 2012-11-07 (×3): 80 mg via ORAL

## 2012-11-04 MED ORDER — LANOLIN HYDROUS EX OINT: 1.0000 "application " | TOPICAL_OINTMENT | CUTANEOUS | Status: DC | PRN

## 2012-11-04 MED ORDER — SODIUM BICARBONATE 8.4 % IV SOLN
INTRAVENOUS | Status: DC | PRN
  Administered 2012-11-04: 4 mL via EPIDURAL

## 2012-11-04 MED ORDER — IBUPROFEN 600 MG PO TABS
600.0000 mg | ORAL_TABLET | Freq: Four times a day (QID) | ORAL | Status: DC
  Administered 2012-11-05 – 2012-11-07 (×8): 600 mg via ORAL
  Filled 2012-11-04 (×8): qty 1

## 2012-11-04 MED ORDER — OXYTOCIN 10 UNIT/ML IJ SOLN
INTRAMUSCULAR | Status: AC
  Filled 2012-11-04: qty 4

## 2012-11-04 MED ORDER — FENTANYL CITRATE 0.05 MG/ML IJ SOLN
100.0000 ug | Freq: Once | INTRAMUSCULAR | Status: AC
  Administered 2012-11-04: 100 ug via EPIDURAL

## 2012-11-04 MED ORDER — MORPHINE SULFATE (PF) 0.5 MG/ML IJ SOLN
INTRAMUSCULAR | Status: DC | PRN
  Administered 2012-11-04: 3 mg via EPIDURAL
  Administered 2012-11-04: 2 mg via INTRAVENOUS

## 2012-11-04 MED ORDER — PHENYLEPHRINE 40 MCG/ML (10ML) SYRINGE FOR IV PUSH (FOR BLOOD PRESSURE SUPPORT)
PREFILLED_SYRINGE | INTRAVENOUS | Status: AC
  Filled 2012-11-04: qty 10

## 2012-11-04 MED ORDER — DIPHENHYDRAMINE HCL 50 MG/ML IJ SOLN
12.5000 mg | INTRAMUSCULAR | Status: DC | PRN
Start: 2012-11-04 — End: 2012-11-06

## 2012-11-04 MED ORDER — FENTANYL 2.5 MCG/ML BUPIVACAINE 1/10 % EPIDURAL INFUSION (WH - ANES)
INTRAMUSCULAR | Status: DC | PRN
  Administered 2012-11-04: 14 mL/h via EPIDURAL

## 2012-11-04 MED ORDER — BUPIVACAINE HCL (PF) 0.25 % IJ SOLN
INTRAMUSCULAR | Status: DC | PRN
  Administered 2012-11-04: 5 mL via EPIDURAL
  Administered 2012-11-04: 5 mL

## 2012-11-04 MED ORDER — DIPHENHYDRAMINE HCL 25 MG PO CAPS: 25.0000 mg | ORAL_CAPSULE | Freq: Four times a day (QID) | ORAL | Status: DC | PRN

## 2012-11-04 MED ORDER — PHENYLEPHRINE 40 MCG/ML (10ML) SYRINGE FOR IV PUSH (FOR BLOOD PRESSURE SUPPORT)
PREFILLED_SYRINGE | INTRAVENOUS | Status: AC
  Filled 2012-11-04: qty 5

## 2012-11-04 MED ORDER — HYDROMORPHONE HCL PF 1 MG/ML IJ SOLN
INTRAMUSCULAR | Status: AC
  Filled 2012-11-04: qty 1

## 2012-11-04 MED ORDER — DIPHENHYDRAMINE HCL 25 MG PO CAPS: 25.0000 mg | ORAL_CAPSULE | ORAL | Status: DC | PRN

## 2012-11-04 MED ORDER — BUPIVACAINE HCL (PF) 0.5 % IJ SOLN
INTRAMUSCULAR | Status: DC | PRN
  Administered 2012-11-04: 30 mL

## 2012-11-04 MED ORDER — NALOXONE HCL 1 MG/ML IJ SOLN
1.0000 ug/kg/h | INTRAVENOUS | Status: DC | PRN
  Filled 2012-11-04: qty 2

## 2012-11-04 MED ORDER — LACTATED RINGERS IV SOLN
500.0000 mL | Freq: Once | INTRAVENOUS | Status: AC
Start: 2012-11-04 — End: 2012-11-04
  Administered 2012-11-04: 500 mL via INTRAVENOUS

## 2012-11-04 MED ORDER — PHENYLEPHRINE 40 MCG/ML (10ML) SYRINGE FOR IV PUSH (FOR BLOOD PRESSURE SUPPORT)
80.0000 ug | PREFILLED_SYRINGE | INTRAVENOUS | Status: DC | PRN
  Filled 2012-11-04: qty 5

## 2012-11-04 MED ORDER — KETOROLAC TROMETHAMINE 30 MG/ML IJ SOLN
30.0000 mg | Freq: Four times a day (QID) | INTRAMUSCULAR | Status: AC | PRN
  Administered 2012-11-04: 30 mg via INTRAVENOUS

## 2012-11-04 MED ORDER — LABETALOL HCL 5 MG/ML IV SOLN
20.0000 mg | INTRAVENOUS | Status: DC | PRN
  Administered 2012-11-04: 20 mg via INTRAVENOUS
  Filled 2012-11-04 (×2): qty 4

## 2012-11-04 MED ORDER — FENTANYL CITRATE 0.05 MG/ML IJ SOLN
INTRAMUSCULAR | Status: DC | PRN
  Administered 2012-11-04: 100 ug via INTRAVENOUS
  Administered 2012-11-04: 100 ug via EPIDURAL

## 2012-11-04 MED ORDER — EPHEDRINE 5 MG/ML INJ: 10.0000 mg | INTRAVENOUS | Status: DC | PRN

## 2012-11-04 MED ORDER — DIPHENHYDRAMINE HCL 50 MG/ML IJ SOLN: 25.0000 mg | INTRAMUSCULAR | Status: DC | PRN

## 2012-11-04 MED ORDER — KETOROLAC TROMETHAMINE 30 MG/ML IJ SOLN
INTRAMUSCULAR | Status: AC
  Filled 2012-11-04: qty 1

## 2012-11-04 MED ORDER — MORPHINE SULFATE 0.5 MG/ML IJ SOLN
INTRAMUSCULAR | Status: AC
  Filled 2012-11-04: qty 10

## 2012-11-04 MED ORDER — DIBUCAINE 1 % RE OINT
1.0000 "application " | TOPICAL_OINTMENT | RECTAL | Status: DC | PRN
  Filled 2012-11-04: qty 28

## 2012-11-04 MED ORDER — SCOPOLAMINE 1 MG/3DAYS TD PT72
1.0000 | MEDICATED_PATCH | Freq: Once | TRANSDERMAL | Status: DC
  Administered 2012-11-04: 1.5 mg via TRANSDERMAL

## 2012-11-04 MED ORDER — PRENATAL MULTIVITAMIN CH
1.0000 | ORAL_TABLET | Freq: Every day | ORAL | Status: DC
  Administered 2012-11-06 – 2012-11-07 (×2): 1 via ORAL
  Filled 2012-11-04 (×2): qty 1

## 2012-11-04 MED ORDER — OXYTOCIN 40 UNITS IN LACTATED RINGERS INFUSION - SIMPLE MED
62.5000 mL/h | INTRAVENOUS | Status: DC
  Administered 2012-11-05: 62.5 mL/h via INTRAVENOUS

## 2012-11-04 MED ORDER — ONDANSETRON HCL 4 MG/2ML IJ SOLN: 4.0000 mg | Freq: Three times a day (TID) | INTRAMUSCULAR | Status: DC | PRN

## 2012-11-04 MED ORDER — KETOROLAC TROMETHAMINE 30 MG/ML IJ SOLN: 30.0000 mg | Freq: Four times a day (QID) | INTRAMUSCULAR | Status: AC | PRN

## 2012-11-04 MED ORDER — CEFAZOLIN SODIUM-DEXTROSE 2-3 GM-% IV SOLR
2.0000 g | Freq: Once | INTRAVENOUS | Status: AC
  Administered 2012-11-04: 2 g via INTRAVENOUS
  Filled 2012-11-04: qty 50

## 2012-11-04 SURGICAL SUPPLY — 31 items
CLOTH BEACON ORANGE TIMEOUT ST (SAFETY) ×2 IMPLANT
DRAPE LG THREE QUARTER DISP (DRAPES) ×2 IMPLANT
DRSG OPSITE POSTOP 4X10 (GAUZE/BANDAGES/DRESSINGS) ×2 IMPLANT
DURAPREP 26ML APPLICATOR (WOUND CARE) ×2 IMPLANT
ELECT REM PT RETURN 9FT ADLT (ELECTROSURGICAL) ×2
ELECTRODE REM PT RTRN 9FT ADLT (ELECTROSURGICAL) ×1 IMPLANT
EXTRACTOR VACUUM M CUP 4 TUBE (SUCTIONS) IMPLANT
GLOVE BIO SURGEON STRL SZ7 (GLOVE) ×2 IMPLANT
GLOVE BIOGEL PI IND STRL 7.0 (GLOVE) ×2 IMPLANT
GLOVE BIOGEL PI INDICATOR 7.0 (GLOVE) ×2
GOWN STRL REIN XL XLG (GOWN DISPOSABLE) ×4 IMPLANT
KIT ABG SYR 3ML LUER SLIP (SYRINGE) IMPLANT
NEEDLE HYPO 22GX1.5 SAFETY (NEEDLE) ×2 IMPLANT
NEEDLE HYPO 25X5/8 SAFETYGLIDE (NEEDLE) IMPLANT
NS IRRIG 1000ML POUR BTL (IV SOLUTION) ×2 IMPLANT
PACK C SECTION WH (CUSTOM PROCEDURE TRAY) ×2 IMPLANT
PAD ABD 7.5X8 STRL (GAUZE/BANDAGES/DRESSINGS) ×2 IMPLANT
PAD OB MATERNITY 4.3X12.25 (PERSONAL CARE ITEMS) ×2 IMPLANT
RTRCTR C-SECT PINK 25CM LRG (MISCELLANEOUS) ×2 IMPLANT
STAPLER VISISTAT 35W (STAPLE) IMPLANT
SUT PDS AB 0 CT1 27 (SUTURE) IMPLANT
SUT PDS AB 0 CTX 36 PDP370T (SUTURE) IMPLANT
SUT VIC AB 0 CT1 36 (SUTURE) ×4 IMPLANT
SUT VIC AB 0 CTX 36 (SUTURE) ×2
SUT VIC AB 0 CTX36XBRD ANBCTRL (SUTURE) ×2 IMPLANT
SUT VIC AB 4-0 KS 27 (SUTURE) ×2 IMPLANT
SYR 30ML LL (SYRINGE) ×2 IMPLANT
TAPE CLOTH SURG 4X10 WHT LF (GAUZE/BANDAGES/DRESSINGS) ×2 IMPLANT
TOWEL OR 17X24 6PK STRL BLUE (TOWEL DISPOSABLE) ×6 IMPLANT
TRAY FOLEY CATH 14FR (SET/KITS/TRAYS/PACK) IMPLANT
WATER STERILE IRR 1000ML POUR (IV SOLUTION) ×2 IMPLANT

## 2012-11-04 NOTE — Op Note (Signed)
Bonnie Brown PROCEDURE DATE: 11/03/2012 - 11/04/2012  PREOPERATIVE DIAGNOSES: Intrauterine pregnancy at  [redacted]w[redacted]d weeks gestation; failure to progress: arrest of descent  POSTOPERATIVE DIAGNOSES: The same  PROCEDURE: Primary Low Transverse Cesarean Section  SURGEON:  Dr. Jaynie Collins  ANESTHESIOLOGIST: Dr. Mal Amabile  INDICATIONS: Bonnie Brown is a 32 y.o. G2P1011 at [redacted]w[redacted]d here for cesarean section secondary to the indications listed under preoperative diagnosis; please see preoperative note for further details.  The risks of cesarean section were discussed with the patient including but were not limited to: bleeding which may require transfusion or reoperation; infection which may require antibiotics; injury to bowel, bladder, ureters or other surrounding organs; injury to the fetus; need for additional procedures including hysterectomy in the event of a life-threatening hemorrhage; placental abnormalities wth subsequent pregnancies, incisional problems, thromboembolic phenomenon and other postoperative/anesthesia complications.   The patient concurred with the proposed plan, giving informed written consent for the procedure.    FINDINGS:  Viable female infant in cephalic presentation.  Apgars 8 and 9.  Clear amniotic fluid.  Intact placenta, three vessel cord.  Normal uterus, fallopian tubes and ovaries bilaterally.  ANESTHESIA: Epidural INTRAVENOUS FLUIDS: 900 ml ESTIMATED BLOOD LOSS: 700 ml URINE OUTPUT:  75 ml SPECIMENS: Placenta sent to pathology COMPLICATIONS: None immediate  PROCEDURE IN DETAIL:  The patient preoperatively received intravenous antibiotics and had sequential compression devices applied to her lower extremities.  She was then taken to the operating room where the epidural anesthesia was dosed up to surgical level and was found to be adequate. She was then placed in a dorsal supine position with a leftward tilt, and prepped and draped in a sterile manner.  A  foley catheter was placed into her bladder and attached to constant gravity.  After an adequate timeout was performed, a Pfannenstiel skin incision was made with scalpel and carried through to the underlying layer of fascia. The fascia was incised in the midline, and this incision was extended bilaterally using the Mayo scissors.  Kocher clamps were applied to the superior aspect of the fascial incision and the underlying rectus muscles were dissected off bluntly. A similar process was carried out on the inferior aspect of the fascial incision. The rectus muscles were separated in the midline bluntly and the peritoneum was entered bluntly. Attention was turned to the lower uterine segment where a low transverse hysterotomy was made with a scalpel and extended bilaterally bluntly.  The infant was successfully delivered, the cord was clamped and cut and the infant was handed over to awaiting neonatology team. Uterine massage was then administered, and the placenta delivered intact with a three-vessel cord. The uterus was then cleared of clot and debris.  The hysterotomy was closed with 0 Vicryl in a running locked fashion, and an imbricating layer was also placed with 0 Vicryl. The pelvis was cleared of all clot and debris. Hemostasis was confirmed on all surfaces.  The peritoneum and the muscles were reapproximated using 0 Vicryl running stitches. The fascia was then closed using 0 Vicryl in a running fashion.  The subcutaneous layer was irrigated, and the skin was closed with a 4-0 Vicryl subcuticular stitch. 30 ml of 0.5% Marcaine was injected subcutaneously around the incision. The patient tolerated the procedure well. Sponge, lap, instrument and needle counts were correct x 2.  She was taken to the recovery room in stable condition.

## 2012-11-04 NOTE — Transfer of Care (Signed)
Immediate Anesthesia Transfer of Care Note  Patient: Bonnie Brown  Procedure(s) Performed: Procedure(s): Primary cesarean section with delivery of baby boy at 2007. apgars 8/9. (N/A)  Patient Location: PACU  Anesthesia Type:Epidural  Level of Consciousness: awake, alert  and oriented  Airway & Oxygen Therapy: Patient Spontanous Breathing  Post-op Assessment: Report given to PACU RN and Post -op Vital signs reviewed and stable  Post vital signs: stable  Complications: No apparent anesthesia complications

## 2012-11-04 NOTE — OR Nursing (Signed)
Patient sent for at 1934.

## 2012-11-04 NOTE — Anesthesia Postprocedure Evaluation (Signed)
  Anesthesia Post-op Note  Patient: Bonnie Brown  Procedure(s) Performed: Procedure(s): Primary cesarean section with delivery of baby boy at 2007. apgars 8/9. (N/A)  Patient Location: PACU  Anesthesia Type:Epidural  Level of Consciousness: awake, alert  and oriented  Airway and Oxygen Therapy: Patient Spontanous Breathing  Post-op Pain: none  Post-op Assessment: Post-op Vital signs reviewed, Patient's Cardiovascular Status Stable, Respiratory Function Stable, Patent Airway, No signs of Nausea or vomiting, Pain level controlled, No headache, No backache, No residual numbness and No residual motor weakness  Post-op Vital Signs: Reviewed and stable  Complications: No apparent anesthesia complications

## 2012-11-04 NOTE — OR Nursing (Addendum)
Uterus massaged by S. Gailene Youkhana Charity fundraiser. Two tubes of cord blood sent to lab. Foley catheter in upon arrival to Or. Urine color blood tinged.  20 cc of blood evacuated from uterus during uterine massage.

## 2012-11-04 NOTE — Anesthesia Procedure Notes (Signed)
Epidural Patient location during procedure: OB Start time: 11/04/2012 1:56 AM  Staffing Anesthesiologist: Yazmeen Woolf A. Performed by: anesthesiologist   Preanesthetic Checklist Completed: patient identified, site marked, surgical consent, pre-op evaluation, timeout performed, IV checked, risks and benefits discussed and monitors and equipment checked  Epidural Patient position: sitting Prep: site prepped and draped and DuraPrep Patient monitoring: continuous pulse ox and blood pressure Approach: midline Injection technique: LOR air  Needle:  Needle type: Tuohy  Needle gauge: 17 G Needle length: 9 cm and 9 Needle insertion depth: 5 cm cm Catheter type: closed end flexible Catheter size: 19 Gauge Catheter at skin depth: 10 cm Test dose: negative and Other  Assessment Events: blood not aspirated, injection not painful, no injection resistance, negative IV test and no paresthesia  Additional Notes Patient identified. Risks and benefits discussed including failed block, incomplete  Pain control, post dural puncture headache, nerve damage, paralysis, blood pressure Changes, nausea, vomiting, reactions to medications-both toxic and allergic and post Partum back pain. All questions were answered. Patient expressed understanding and wished to proceed. Sterile technique was used throughout procedure. Epidural site was Dressed with sterile barrier dressing. No paresthesias, signs of intravascular injection Or signs of intrathecal spread were encountered.  Patient was more comfortable after the epidural was dosed. Please see RN's note for documentation of vital signs and FHR which are stable.

## 2012-11-04 NOTE — Progress Notes (Signed)
Patient ID: Bonnie Brown, female   DOB: March 16, 1981, 32 y.o.   MRN: [id number]   S:  Pt comfortable with epidural.  O:  Filed Vitals:   11/03/12 2201 11/03/12 2231 11/03/12 2301 11/03/12 2331  BP: 154/92 159/91 158/98 158/96  Pulse: 95 99 101 91  Temp:    98.3 F (36.8 C)  TempSrc:    Oral  Resp:    16  Height:      Weight:        CERV:  6/90/-1 to -2 AROM when placing FSE:  Clear  FHTs:  110s-120, mod var, occasional accel, no decels. Placed FSE since fetal and maternal heart rates difficult to distinguish.  TOCO:q3-4 min  Results for orders placed during the hospital encounter of 11/03/12 (from the past 48 hour(s))  CBC     Status: Abnormal   Collection Time    11/03/12  8:20 PM      Result Value Range   WBC 10.5  4.0 - 10.5 K/uL   RBC 3.81 (*) 3.87 - 5.11 MIL/uL   Hemoglobin 9.8 (*) 12.0 - 15.0 g/dL   HCT 40.9 (*) 81.1 - 91.4 %   MCV 81.4  78.0 - 100.0 fL   MCH 25.7 (*) 26.0 - 34.0 pg   MCHC 31.6  30.0 - 36.0 g/dL   RDW 78.2 (*) 95.6 - 21.3 %   Platelets 236  150 - 400 K/uL   Comment: REPEATED TO VERIFY     SPECIMEN CHECKED FOR CLOTS  TYPE AND SCREEN     Status: None   Collection Time    11/03/12  8:20 PM      Result Value Range   ABO/RH(D) A POS     Antibody Screen NEG     Sample Expiration 11/06/2012    RPR     Status: None   Collection Time    11/03/12  8:20 PM      Result Value Range   RPR NON REACTIVE  NON REACTIVE  COMPREHENSIVE METABOLIC PANEL     Status: Abnormal   Collection Time    11/03/12  8:20 PM      Result Value Range   Sodium 136  135 - 145 mEq/L   Potassium 3.9  3.5 - 5.1 mEq/L   Chloride 103  96 - 112 mEq/L   CO2 21  19 - 32 mEq/L   Glucose, Bld 107 (*) 70 - 99 mg/dL   BUN 8  6 - 23 mg/dL   Creatinine, Ser 0.86  0.50 - 1.10 mg/dL   Calcium 9.2  8.4 - 57.8 mg/dL   Total Protein 6.0  6.0 - 8.3 g/dL   Albumin 2.5 (*) 3.5 - 5.2 g/dL   AST 24  0 - 37 U/L   ALT 20  0 - 35 U/L   Alkaline Phosphatase 248 (*) 39 - 117 U/L   Total  Bilirubin 0.3  0.3 - 1.2 mg/dL   GFR calc non Af Amer >90  >90 mL/min   GFR calc Af Amer >90  >90 mL/min   Comment:            The eGFR has been calculated     using the CKD EPI equation.     This calculation has not been     validated in all clinical     situations.     eGFR's persistently     <90 mL/min signify     possible Chronic Kidney Disease.  PROTEIN /  CREATININE RATIO, URINE     Status: Abnormal   Collection Time    11/03/12 11:55 PM      Result Value Range   Creatinine, Urine 152.92     Total Protein, Urine 131     Comment: NO NORMAL RANGE ESTABLISHED FOR THIS TEST   PROTEIN CREATININE RATIO 0.86 (*) 0.00 - 0.15    A/P 32 y.o. G2P0010 at [redacted]w[redacted]d with IOL for GHTN - Will start mag for preeclampsia given BP in 150s/90s consistently (in 140s in office) and elevated prot/creat ratio - FHTs reactive - PRogressing with pitocin. - Anticipate SVD  Napoleon Form, MD

## 2012-11-04 NOTE — Progress Notes (Signed)
Faculty Practice OB/GYN Attending Note  Subjective:  Called to evaluate patient with prolonged second stage of labor; after 4.5 hours of pushing.  FHR reactive with a baseline in the 120s, q 2-4 minute contractions. Good FM.   Admitted on 11/03/2012 for IOL for Severe Preeclampsia. She has received a few doses of Labetalol to help with severe range BPs.    Objective:  Blood pressure 142/85, pulse 110, temperature 98.2 F (36.8 C), temperature source Axillary, resp. rate 18, height 5\' 4"  (1.626 m), weight 192 lb (87.091 kg), last menstrual period 02/01/2012, SpO2 98.00%. FHT  Baseline 120 bpm, moderate variability, +accelerations, no decelerations Toco: q 2-4 minutes Cervix: 10/100/+2/caput Ext: 2+ DTRs  Assessment & Plan:  32 y.o. G2P0010 at [redacted]w[redacted]d admitted for IOL for Severe Preeclampsia, now with prolonged second stage and arrest of descent.  Patient was counseled about choices of vacuum-assisted vaginal delivery versus cesarean section.  Risks of both modalities were discussed in detail, all questions answered. Patient opted for cesarean section.  The risks of cesarean section discussed with the patient included but were not limited to: bleeding which may require transfusion or reoperation; infection which may require antibiotics; injury to bowel, bladder, ureters or other surrounding organs; injury to the fetus; need for additional procedures including hysterectomy in the event of a life-threatening hemorrhage; placental abnormalities wth subsequent pregnancies, incisional problems, thromboembolic phenomenon and other postoperative/anesthesia complications. The patient concurred with the proposed plan, giving informed written consent for the procedure.   Anesthesia and OR aware. Preoperative prophylactic antibiotics and SCDs ordered on call to the OR.  To OR when ready.   Jaynie Collins, MD, FACOG Attending Obstetrician & Gynecologist Faculty Practice, [facility] [place]

## 2012-11-04 NOTE — H&P (Signed)
Attestation of Attending Supervision of Advanced Practitioner (CNM/NP): Evaluation and management procedures were performed by the Advanced Practitioner under my supervision and collaboration.  I have reviewed the Advanced Practitioner's note and chart, and I agree with the management and plan.  Perfecto Purdy 11/04/2012 4:29 AM   

## 2012-11-04 NOTE — Anesthesia Preprocedure Evaluation (Addendum)
Anesthesia Evaluation  Patient identified by MRN, date of birth, ID band Patient awake    Reviewed: Allergy & Precautions, H&P , Patient's Chart, lab work & pertinent test results  Airway Mallampati: III TM Distance: >3 FB Neck ROM: Full    Dental no notable dental hx. (+) Teeth Intact   Pulmonary neg pulmonary ROS,  breath sounds clear to auscultation  Pulmonary exam normal       Cardiovascular hypertension, Rhythm:Regular Rate:Normal  PIH   Neuro/Psych negative neurological ROS  negative psych ROS   GI/Hepatic negative GI ROS, Neg liver ROS,   Endo/Other  Hypothyroidism Obesity   Renal/GU negative Renal ROS  negative genitourinary   Musculoskeletal negative musculoskeletal ROS (+)   Abdominal (+) + obese,   Peds  Hematology Hx/o Hodgkin's Diseases S/P ChemoRx   Anesthesia Other Findings   Reproductive/Obstetrics (+) Pregnancy PIH                          Anesthesia Physical Anesthesia Plan  ASA: III and emergent  Anesthesia Plan: General and Epidural   Post-op Pain Management:    Induction:   Airway Management Planned: Natural Airway  Additional Equipment:   Intra-op Plan:   Post-operative Plan:   Informed Consent: I have reviewed the patients History and Physical, chart, labs and discussed the procedure including the risks, benefits and alternatives for the proposed anesthesia with the patient or authorized representative who has indicated his/her understanding and acceptance.   Dental advisory given  Plan Discussed with: Anesthesiologist, CRNA and Surgeon  Anesthesia Plan Comments: (Patient for urgent C/Section for arrest of descent and severe preecclampsia.)      Anesthesia Quick Evaluation

## 2012-11-04 NOTE — Progress Notes (Signed)
Patient ID: KYNDELL ZEISER, female   DOB: [birth year], 32 y.o.   MRN: [id number]  S:  Comfortable.  OCeasar Mons Vitals:   11/04/12 0523 11/04/12 0528 11/04/12 0531 11/04/12 0533  BP:   154/68   Pulse: 117 122 124 122  Temp:      TempSrc:      Resp:      Height:      Weight:      SpO2: 97% 97%  97%    CERV:  7/90/-1  FHTs:  110s, mod var, accels present, no decels TOCO:  q 3-4 minutes Pitocin at 6  A/P 32 y.o. G2P0010 at [redacted]w[redacted]d here for IOL for GHTN/Preeclampsia - On Mag, BP 140-150s/60s-80s - No cervical change last 2 hours.  Increase pitocin. - FHTs reassuring with low baseline - Anticipate SVD  Napoleon Form, MD

## 2012-11-04 NOTE — Progress Notes (Signed)
Pt wants to take a rest period from pushing for a few minutes. Will make MD aware

## 2012-11-04 NOTE — Progress Notes (Signed)
Discussed with the pt possibility o fresting and letting the baby labor down d/t station and position of the baby. Pincus Badder, CNM  agreeable and the pt agreeable at this time. Anesthesia notified. To be in to assess.

## 2012-11-05 ENCOUNTER — Encounter (HOSPITAL_COMMUNITY): Payer: Self-pay | Admitting: Obstetrics & Gynecology

## 2012-11-05 LAB — CBC
HCT: 23 % — ABNORMAL LOW (ref 36.0–46.0)
HCT: 21.2 % — ABNORMAL LOW (ref 36.0–46.0)
Hemoglobin: 7.2 g/dL — ABNORMAL LOW (ref 12.0–15.0)
MCH: 25.2 pg — ABNORMAL LOW (ref 26.0–34.0)
MCHC: 31.1 g/dL (ref 30.0–36.0)
MCV: 81 fL (ref 78.0–100.0)
RBC: 2.84 MIL/uL — ABNORMAL LOW (ref 3.87–5.11)
RDW: 16.7 % — ABNORMAL HIGH (ref 11.5–15.5)
RDW: 16.9 % — ABNORMAL HIGH (ref 11.5–15.5)
WBC: 21.3 10*3/uL — ABNORMAL HIGH (ref 4.0–10.5)

## 2012-11-05 LAB — COMPREHENSIVE METABOLIC PANEL
Albumin: 1.8 g/dL — ABNORMAL LOW (ref 3.5–5.2)
Alkaline Phosphatase: 161 U/L — ABNORMAL HIGH (ref 39–117)
BUN: 12 mg/dL (ref 6–23)
Calcium: 7.7 mg/dL — ABNORMAL LOW (ref 8.4–10.5)
Potassium: 4 mEq/L (ref 3.5–5.1)
Total Protein: 4.7 g/dL — ABNORMAL LOW (ref 6.0–8.3)

## 2012-11-05 LAB — PROTIME-INR
INR: 1.11 (ref 0.00–1.49)
Prothrombin Time: 14.2 seconds (ref 11.6–15.2)

## 2012-11-05 MED ORDER — TRAMADOL HCL 50 MG PO TABS
100.0000 mg | ORAL_TABLET | Freq: Four times a day (QID) | ORAL | Status: DC | PRN
  Administered 2012-11-05 – 2012-11-06 (×2): 100 mg via ORAL
  Filled 2012-11-05 (×2): qty 2

## 2012-11-05 MED ORDER — MISOPROSTOL 200 MCG PO TABS
ORAL_TABLET | ORAL | Status: AC
  Filled 2012-11-05: qty 5

## 2012-11-05 MED ORDER — OXYTOCIN 40 UNITS IN LACTATED RINGERS INFUSION - SIMPLE MED
INTRAVENOUS | Status: AC
  Filled 2012-11-05: qty 1000

## 2012-11-05 MED ORDER — LACTATED RINGERS IV BOLUS (SEPSIS)
500.0000 mL | Freq: Once | INTRAVENOUS | Status: AC
  Administered 2012-11-05: 1000 mL via INTRAVENOUS

## 2012-11-05 MED ORDER — LACTATED RINGERS IV SOLN: INTRAVENOUS | Status: DC

## 2012-11-05 MED ORDER — LACTATED RINGERS IV BOLUS (SEPSIS)
1000.0000 mL | Freq: Once | INTRAVENOUS | Status: AC
  Administered 2012-11-05: 1000 mL via INTRAVENOUS

## 2012-11-05 MED ORDER — MISOPROSTOL 200 MCG PO TABS
1000.0000 ug | ORAL_TABLET | Freq: Once | ORAL | Status: AC
  Administered 2012-11-05: 1000 ug via RECTAL

## 2012-11-05 MED ORDER — METHYLERGONOVINE MALEATE 0.2 MG/ML IJ SOLN
INTRAMUSCULAR | Status: AC
  Filled 2012-11-05: qty 1

## 2012-11-05 MED ORDER — DIPHENOXYLATE-ATROPINE 2.5-0.025 MG PO TABS: 1.0000 | ORAL_TABLET | Freq: Four times a day (QID) | ORAL | Status: DC | PRN

## 2012-11-05 MED ORDER — FENTANYL CITRATE 0.05 MG/ML IJ SOLN
50.0000 ug | Freq: Once | INTRAMUSCULAR | Status: AC
  Administered 2012-11-05: 50 ug via INTRAVENOUS

## 2012-11-05 MED ORDER — CARBOPROST TROMETHAMINE 250 MCG/ML IM SOLN: 250.0000 ug | INTRAMUSCULAR | Status: DC | PRN

## 2012-11-05 MED ORDER — CARBOPROST TROMETHAMINE 250 MCG/ML IM SOLN
INTRAMUSCULAR | Status: AC
  Filled 2012-11-05: qty 1

## 2012-11-05 MED ORDER — FENTANYL CITRATE 0.05 MG/ML IJ SOLN
INTRAMUSCULAR | Status: AC
  Filled 2012-11-05: qty 2

## 2012-11-05 NOTE — Anesthesia Postprocedure Evaluation (Signed)
  Anesthesia Post-op Note  Patient: Bonnie Brown  Procedure(s) Performed: Procedure(s): Primary cesarean section with delivery of baby boy at 2007. apgars 8/9. (N/A)  Patient Location: PACU  Anesthesia Type:Epidural  Level of Consciousness: awake, alert , oriented and patient cooperative  Airway and Oxygen Therapy: Patient Spontanous Breathing  Post-op Pain: none  Post-op Assessment: Post-op Vital signs reviewed, Patient's Cardiovascular Status Stable and Respiratory Function Stable  Post-op Vital Signs: Reviewed and stable  Complications: No apparent anesthesia complications

## 2012-11-05 NOTE — Progress Notes (Signed)
Subjective: Postpartum Day #1: Cesarean Delivery Patient reports being fatigued.  PPH overnight. Q1 hour fundal checks.  Remains on magnesium.  Denies vision changes, headache. No orthostasis, dyspnea, chest pain.  No noted mental status changes.    Objective: Vital signs in last 24 hours: Temp:  [97.6 F (36.4 C)-98.7 F (37.1 C)] 98 F (36.7 C) (04/30 0811) Pulse Rate:  [73-145] 103 (04/30 0811) Resp:  [10-28] 18 (04/30 0811) BP: (73-187)/(32-112) 123/73 mmHg (04/30 0811) SpO2:  [84 %-100 %] 99 % (04/30 0811)  Physical Exam:  General: alert, cooperative, mild distress, pale and non-toxic Lochia: appropriate now.  Previously heavy with excessive blood loss Uterine Fundus: firm and well below umbilicus Incision: dressing is clean, dry and intact.  No exudate noted DVT Evaluation: No evidence of DVT seen on physical exam. Negative Homan's sign. No cords or calf tenderness. No significant calf/ankle edema. NEURO:  2 beat clonus, no increased resting tone.  DTRs 2+/4 in UE and LE   Recent Labs  11/05/12 0045 11/05/12 0600  HGB 7.2* 6.6*  HCT 23.0* 21.2*    Assessment/Plan: Status post Cesarean section. Doing well postoperatively. Bleeding now controlled.  Noted drop in Hb from 9 to 6.6.  Will transfuse if Hb<6.0 or if symptomatic (orthostasis, dyspnea, chest pain) Continue current care.  Andrena Mews, DO [facility] Resident - PGY-2 11/05/2012 8:43 AM

## 2012-11-05 NOTE — Progress Notes (Signed)
Stage II PPH protocol initiated.

## 2012-11-05 NOTE — Progress Notes (Signed)
Stage I PPH Protocol initiated.    This note also relates to the following rows which could not be included: Pulse Rate - Cannot attach notes to unvalidated device data SpO2 - Cannot attach notes to unvalidated device data

## 2012-11-05 NOTE — Progress Notes (Signed)
Called back to room.  1 noted hypotensive BP but pt asymptomatic.  1000cc bolus of LR.  Had 1 additional large clot with fundal massage.  After passing of clot uterus is now noted to be 3-4cm below umbilicus and firm.  Minimal bleeding with fundal massage currently  Hb noted to be 7.2 down from 9.5.    Due to continued bleed stage 2 PPH initiated including lab work - PT/INR, PTT and Fibrinogen.  Anesthesia aware; have discussed with Dr. Macon Large.    Low threshold for transfusion.  If any additional significant bleeding will use Hemabate but pt is currently wanting to wait given current significant improvement in bleeding and uterine tone.  Andrena Mews, DO [facility] Resident - PGY-2 11/05/2012 1:45 AM

## 2012-11-05 NOTE — Progress Notes (Signed)
Called to pt room due to significant bleeding post partum following LTCS for failure to progress.  She has been estimated to have >1400cc ABL and was most recently noted to have passed a >700cc clot on last check.  Risk factors include severe Pre-Eclampsia on Mag.  Denies chest pain or significant dyspnea at rest.    Pt has received 1 bolus of Pitocin.  Has received >1300cc IVFs in last 4 hours.  Foley in place, draining normally.  Pt given Fentanyl IV X 1 prior to exam.  On exam uterine atony appreciated with uterus above umbilicus and deviated to the Right.  Bimanual exam revealed large clots in posterior uterine segment; manually extracted 585 g of clot.  Bleeding improved.  Cytotec 1000mg  placed PR.  Significantly improved uterine tone and improved bleeding.  Uterus firm, 2cm below umbilicus and not deviated.  Pt to remain on L&D for 24hours of magnesium, then to post partum.  Continue to monitor and check CBC NOW. Discussed possibility for need for transfusion.  Threshold if symptomatic or Hemoglobin <6.0 .  Clear liquid diet.  Andrena Mews, DO [facility] Resident - PGY-2 11/05/2012 12:52 AM

## 2012-11-06 LAB — CBC
HCT: 16.9 % — ABNORMAL LOW (ref 36.0–46.0)
MCH: 25.6 pg — ABNORMAL LOW (ref 26.0–34.0)
MCV: 81.6 fL (ref 78.0–100.0)
Platelets: 219 10*3/uL (ref 150–400)
RBC: 2.07 MIL/uL — ABNORMAL LOW (ref 3.87–5.11)
WBC: 16.9 10*3/uL — ABNORMAL HIGH (ref 4.0–10.5)

## 2012-11-06 MED ORDER — FERROUS SULFATE 325 (65 FE) MG PO TABS
325.0000 mg | ORAL_TABLET | Freq: Three times a day (TID) | ORAL | Status: DC
  Administered 2012-11-06 – 2012-11-07 (×4): 325 mg via ORAL
  Filled 2012-11-06 (×4): qty 1

## 2012-11-06 MED ORDER — LEVOTHYROXINE SODIUM 50 MCG PO TABS
50.0000 ug | ORAL_TABLET | Freq: Every day | ORAL | Status: DC
  Administered 2012-11-06 – 2012-11-07 (×2): 50 ug via ORAL
  Filled 2012-11-06 (×3): qty 1

## 2012-11-06 NOTE — Progress Notes (Signed)
CRITICAL VALUE ALERT  Critical value received: Hgb 5.3  Date of notification:  11/06/12  Time of notification:  0720  Critical value read back:yes  Nurse who received alert:  Mont Dutton, RN  MD notified (1st page): will notify on rounds   Time of first page:    MD notified (2nd page):  Time of second page:  Responding MD:    Time MD responded:

## 2012-11-06 NOTE — Progress Notes (Signed)
Post-Op Day 2, 2nd stage arrest  Subjective: No complaints, up ad lib, voiding and tolerating PO,not passing flatus,small lochia, no method.  No c/o dizziness, feels "a little weak."  Objective: Blood pressure 121/70, pulse 98, temperature 98.5 F (36.9 C), temperature source Oral, resp. rate 17, height 5\' 4"  (1.626 m), weight 192 lb (87.091 kg), last menstrual period 02/01/2012, SpO2 97.00%, unknown if currently breastfeeding.  Breastfeeding fair  Physical Exam:  General: alert, cooperative and no distress Lochia:normal flow Chest: CTAB Heart: RRR no m/r/g Abdomen: +BS, soft, nontender,  Uterine Fundus: firm DVT Evaluation: No evidence of DVT seen on physical exam. Extremities: 1+ edema, DTR 2+   Recent Labs  11/05/12 0045 11/05/12 0600  HGB 7.2* 6.6*  HCT 23.0* 21.2*    Assessment/Plan: Lactation consult CBC Consider blood transfusion if hgb<6   LOS: 3 days   CRESENZO-DISHMAN,Norvella Loscalzo 11/06/2012, 6:53 AM

## 2012-11-06 NOTE — Progress Notes (Signed)
OB Faculty Practice Service Interval Progress Note  Went by to see patient to discuss hemoglobin result of 5.3. She reports she is feeling well, able to walk around without dizziness, lightheadedness. No shortness of breath. After speaking with Dr. Jolayne Panther, I offered pt a blood transfusion, but she declines at this time. Will continue to monitor her clinical status.  Levert Feinstein, MD Family Medicine PGY-1

## 2012-11-06 NOTE — Anesthesia Postprocedure Evaluation (Signed)
  Anesthesia Post-op Note  Patient: Bonnie Brown  Procedure(s) Performed: Procedure(s): Primary cesarean section with delivery of baby boy at 2007. apgars 8/9. (N/A)  Patient Location: Mother/Baby  Anesthesia Type:Epidural  Level of Consciousness: awake, alert , oriented and patient cooperative  Airway and Oxygen Therapy: Patient Spontanous Breathing  Post-op Pain: mild  Post-op Assessment: Patient's Cardiovascular Status Stable, Respiratory Function Stable, No backache, No residual numbness and No residual motor weakness  Post-op Vital Signs: stable  Complications: No apparent anesthesia complications

## 2012-11-06 NOTE — Telephone Encounter (Signed)
Joellyn Haff CNM SPOKE W/ PT.. I AM CLOSING ENCOUNTER

## 2012-11-07 LAB — CBC
HCT: 18.9 % — ABNORMAL LOW (ref 36.0–46.0)
Hemoglobin: 5.9 g/dL — CL (ref 12.0–15.0)
MCH: 25.7 pg — ABNORMAL LOW (ref 26.0–34.0)
MCHC: 31.2 g/dL (ref 30.0–36.0)
RDW: 16.9 % — ABNORMAL HIGH (ref 11.5–15.5)

## 2012-11-07 MED ORDER — IBUPROFEN 600 MG PO TABS: 600.0000 mg | ORAL_TABLET | Freq: Four times a day (QID) | ORAL | Status: DC

## 2012-11-07 MED ORDER — SENNOSIDES-DOCUSATE SODIUM 8.6-50 MG PO TABS: 2.0000 | ORAL_TABLET | Freq: Every day | ORAL | Status: DC

## 2012-11-07 MED ORDER — INTEGRA 62.5-62.5-40-3 MG PO CAPS: 1.0000 | ORAL_CAPSULE | Freq: Every day | ORAL | Status: DC

## 2012-11-07 MED ORDER — TRAMADOL HCL 50 MG PO TABS: 100.0000 mg | ORAL_TABLET | Freq: Four times a day (QID) | ORAL | Status: DC | PRN

## 2012-11-07 NOTE — Progress Notes (Signed)
CRITICAL VALUE ALERT  Critical value received: hemoglobin 5.9  Date of notification: 11/07/12  Time of notification:  0647  Critical value read back:yes  Nurse who received alert:  Kerin Ransom RN  MD notified (1st pag  Time of first page:   MD notified (2nd page):  Time of second page:  Responding MD:    Time MD responded:

## 2012-11-07 NOTE — Discharge Summary (Signed)
Obstetric Discharge Summary  Bonnie Brown is a 32 y.o. G2P1011 presenting at [redacted]w[redacted]d for IOL for GHTN. She progressed to complete dilation but went to cesarean section due to arrest of descent. She developed severe blood pressures and was treated with magnesium infusion intrapartum and for 24 hours post-partum.  She had a postpartum hemorrhage with significant drop in hemoglobin postpartum. However, she was asymptomatic and declined transfusion. She is breastfeeding and does not plan to use any form of contraception since this pregnancy was the result of IVF.  Reason for Admission: induction of labor Prenatal Procedures: none Intrapartum Procedures: cesarean: low cervical, transverse Postpartum Procedures: none Complications-Operative and Postpartum: hemorrhage and preeclampsia Hemoglobin  Date Value Range Status  11/07/2012 5.9* 12.0 - 15.0 g/dL Final     REPEATED TO VERIFY     CRITICAL RESULT CALLED TO, READ BACK BY AND VERIFIED WITH:     BROWN,P @0647  ON [id number] BY FLEMINGS  08/13/2012 10.4   Final     HCT  Date Value Range Status  11/07/2012 18.9* 36.0 - 46.0 % Final  08/13/2012 32   Final    Physical Exam:  General: alert, cooperative and no distress Lochia: appropriate Uterine Fundus: firm Incision: clean, dry, intact DVT Evaluation: minimal edema, non-tender, no signs DVT  Discharge Diagnoses: Term Pregnancy-delivered, Failed induction (arrest of descent) with primary low transverse cesarean section, and Gestational hypertension with superimposed preeclampsia  Discharge Information: Date: 11/07/2012 Activity: pelvic rest Diet: routine Medications: PNV, Ibuprofen, Colace, Iron and Percocet Condition: stable Instructions: refer to practice specific booklet Discharge to: home Follow-up Information   Follow up with [facility] On 11/11/2012. (4:00 PM)    Contact information:   [address] [place] [address] [phone]      Newborn Data: Live born female   Birth Weight: 8 lb 11.7 oz (3960 g) APGAR: 8, 9  Home with mother.  Napoleon Form 11/07/2012, 10:42 AM

## 2012-11-10 NOTE — Progress Notes (Signed)
Post discharge chart review completed.  

## 2012-11-11 ENCOUNTER — Encounter: Payer: PRIVATE HEALTH INSURANCE | Admitting: Women's Health

## 2012-11-11 ENCOUNTER — Ambulatory Visit (INDEPENDENT_AMBULATORY_CARE_PROVIDER_SITE_OTHER): Payer: PRIVATE HEALTH INSURANCE | Admitting: Women's Health

## 2012-11-11 ENCOUNTER — Encounter: Payer: Self-pay | Admitting: Women's Health

## 2012-11-11 DIAGNOSIS — D649 Anemia, unspecified: Secondary | ICD-10-CM

## 2012-11-11 DIAGNOSIS — O9081 Anemia of the puerperium: Secondary | ICD-10-CM

## 2012-11-11 DIAGNOSIS — IMO0002 Reserved for concepts with insufficient information to code with codable children: Secondary | ICD-10-CM

## 2012-11-11 MED ORDER — HYDROCHLOROTHIAZIDE 12.5 MG PO TABS: 25.0000 mg | ORAL_TABLET | Freq: Every day | ORAL | Status: DC

## 2012-11-11 MED ORDER — AMLODIPINE BESYLATE 5 MG PO TABS: 5.0000 mg | ORAL_TABLET | Freq: Every day | ORAL | Status: DC

## 2012-11-11 NOTE — Patient Instructions (Signed)

## 2012-11-11 NOTE — Progress Notes (Addendum)
Bonnie Brown is a 32 y.o. G42P1011 female app 1wk s/p PLTCS for arrest of 2nd stage descent following IOL for Pre-e @ 39wks.  She had a large pp hemorrhage w/ drop in hgb from 9.8 to 5.3, then back to 5.9 when d/c'd from hospital.  She was asymptomatic and did not receive any blood transfusions.  She has been taking her pnv and fe supplement as directed. She reports occ ha relieved by motrin.  Denies scotomata, ruq/epigastric pain, or n/v. Reports normal lochia.  Not on any bp meds. Denies dizziness, lightheadedness, sob.  She presents today for f/u bp and hgb check.   O:   C/S incision- honeycomb dressing removed, incision well approximated and healing nicely  FF u-4  DTRs 3+ bilaterally  2-3+ edema Lt lower extremity, 1+ Rt lower extremity  No clonus  BP: 160/104  A:  1wk s/p PLTCS  Anemia d/t acute blood loss  PP pre-eclampsia  Breastfeeding  P:  CBC today  Rx HCTZ 25mg  daily  Rx Norvasc 5mg  daily  Continue pnv & fe supplement  Reviewed pre-e warning s/s  Reviewed anemia s/s to report.  F/U Fri for bp recheck  Discussed w/ JVF Grace Bushy, Cheron Every

## 2012-11-11 NOTE — Progress Notes (Signed)
I have seen and examined this patient and I agree with the above. Cam Hai 11:31 PM 11/11/2012

## 2012-11-12 ENCOUNTER — Telehealth: Payer: Self-pay | Admitting: Adult Health

## 2012-11-12 LAB — CBC
MCH: 25.6 pg — ABNORMAL LOW (ref 26.0–34.0)
MCHC: 30.6 g/dL (ref 30.0–36.0)
MCV: 83.7 fL (ref 78.0–100.0)
Platelets: 427 10*3/uL — ABNORMAL HIGH (ref 150–400)
RDW: 19.9 % — ABNORMAL HIGH (ref 11.5–15.5)

## 2012-11-12 NOTE — Telephone Encounter (Signed)
Pt is aware of labs, hgb is 6.6 which is up from 5.9 on 5/2. Her wbc 13.1,rbc 2.58, platelets 427. She is taking prenatal vitamins with iron and Integra. She said her BP is better it is 144/93 today and she is taking norvasc 5 mg and HCTZ 12.5 mg 2 daily. She has an appt. 5/9.

## 2012-11-14 ENCOUNTER — Ambulatory Visit (INDEPENDENT_AMBULATORY_CARE_PROVIDER_SITE_OTHER): Payer: PRIVATE HEALTH INSURANCE | Admitting: Adult Health

## 2012-11-14 ENCOUNTER — Encounter: Payer: Self-pay | Admitting: Adult Health

## 2012-11-14 DIAGNOSIS — D649 Anemia, unspecified: Secondary | ICD-10-CM

## 2012-11-14 DIAGNOSIS — IMO0002 Reserved for concepts with insufficient information to code with codable children: Secondary | ICD-10-CM

## 2012-11-14 DIAGNOSIS — O9081 Anemia of the puerperium: Secondary | ICD-10-CM

## 2012-11-14 DIAGNOSIS — O165 Unspecified maternal hypertension, complicating the puerperium: Secondary | ICD-10-CM | POA: Insufficient documentation

## 2012-11-14 NOTE — Patient Instructions (Addendum)
Stop norvasc Check BP at home Follow up in 1 week

## 2012-11-14 NOTE — Progress Notes (Signed)
Subjective:     Patient ID: Bonnie Brown, female   DOB: 18-May-1981, 32 y.o.   MRN: [id number]  HPI Trivia is back for BP check.  Review of Systems She is feeling better, but still weak, She is breast feeding and he is doing well. No swelling in feet and ankles and she has lost about 6 more pounds. Reviewed past medical,surgical, social and family history. Reviewed medications and allergies.     Objective:   Physical Exam Blood pressure 112/74, height 5\' 4"  (1.626 m), weight 159 lb (72.122 kg), last menstrual period 02/01/2012, currently breastfeeding.BP was 100/60 left arm, will stop norvasc, increase fluids.   Did discuss with Dr. Despina Hidden. Assessment:      Hypertension in pregnancy and postpartum, now resolving   Anemia Plan:      Stop norvasc, continue other meds. Increase fluids Follow up in 1 week for BP check and check hgb

## 2012-11-21 ENCOUNTER — Ambulatory Visit (INDEPENDENT_AMBULATORY_CARE_PROVIDER_SITE_OTHER): Payer: PRIVATE HEALTH INSURANCE | Admitting: Adult Health

## 2012-11-21 ENCOUNTER — Encounter: Payer: Self-pay | Admitting: Adult Health

## 2012-11-21 VITALS — BP 108/78 | Ht 64.0 in | Wt 159.0 lb

## 2012-11-21 DIAGNOSIS — IMO0002 Reserved for concepts with insufficient information to code with codable children: Secondary | ICD-10-CM

## 2012-11-21 DIAGNOSIS — D649 Anemia, unspecified: Secondary | ICD-10-CM

## 2012-11-21 DIAGNOSIS — O9081 Anemia of the puerperium: Secondary | ICD-10-CM

## 2012-11-21 DIAGNOSIS — E079 Disorder of thyroid, unspecified: Secondary | ICD-10-CM

## 2012-11-21 DIAGNOSIS — E039 Hypothyroidism, unspecified: Secondary | ICD-10-CM

## 2012-11-21 LAB — COMPREHENSIVE METABOLIC PANEL
BUN: 13 mg/dL (ref 6–23)
CO2: 24 mEq/L (ref 19–32)
Creat: 0.73 mg/dL (ref 0.50–1.10)
Glucose, Bld: 71 mg/dL (ref 70–99)
Total Bilirubin: 0.6 mg/dL (ref 0.3–1.2)

## 2012-11-21 LAB — CBC
HCT: 27.1 % — ABNORMAL LOW (ref 36.0–46.0)
Hemoglobin: 8.2 g/dL — ABNORMAL LOW (ref 12.0–15.0)
MCH: 25.9 pg — ABNORMAL LOW (ref 26.0–34.0)
MCHC: 30.3 g/dL (ref 30.0–36.0)

## 2012-11-21 NOTE — Patient Instructions (Addendum)
Stop all bp meds Cont iron Return in 2 weeks

## 2012-11-21 NOTE — Progress Notes (Signed)
Subjective:     Patient ID: Bonnie Brown, female   DOB: 12-31-1980, 32 y.o.   MRN: [id number]  HPI Bonnie Brown is back for BP check and to check HGB. She feels better, still tired.Baby feeds often.  Review of Systems Complains of being tired. Reviewed past medical,surgical, social and family history. Reviewed medications and allergies.     Objective:   Physical Exam Blood pressure 108/78, height 5\' 4"  (1.626 m), weight 159 lb (72.122 kg), currently breastfeeding.HGB 8.6, abdomen soft and incision healing, edges together, no redness. No swelling in feet and ankles    Assessment:      Anemia  Hypothyroid Resolved hypertension    Plan:      Check CBC,TSH, CMP Return in 2 weeks for follow up  Postpartum visit 6/10 Stop all BP meds and continue Iron , eat red meat and spinach and raisins Call any problems, try to sleep more

## 2012-11-24 ENCOUNTER — Other Ambulatory Visit: Payer: PRIVATE HEALTH INSURANCE

## 2012-11-24 ENCOUNTER — Ambulatory Visit (INDEPENDENT_AMBULATORY_CARE_PROVIDER_SITE_OTHER): Payer: PRIVATE HEALTH INSURANCE | Admitting: Adult Health

## 2012-11-24 ENCOUNTER — Encounter: Payer: Self-pay | Admitting: Adult Health

## 2012-11-24 VITALS — BP 110/76 | Ht 64.0 in | Wt 160.0 lb

## 2012-11-24 DIAGNOSIS — O9081 Anemia of the puerperium: Secondary | ICD-10-CM

## 2012-11-24 DIAGNOSIS — R7989 Other specified abnormal findings of blood chemistry: Secondary | ICD-10-CM

## 2012-11-24 DIAGNOSIS — Z8571 Personal history of Hodgkin lymphoma: Secondary | ICD-10-CM

## 2012-11-24 DIAGNOSIS — D649 Anemia, unspecified: Secondary | ICD-10-CM | POA: Insufficient documentation

## 2012-11-24 DIAGNOSIS — R748 Abnormal levels of other serum enzymes: Secondary | ICD-10-CM

## 2012-11-24 LAB — CBC
Platelets: 773 10*3/uL — ABNORMAL HIGH (ref 150–400)
RDW: 20.1 % — ABNORMAL HIGH (ref 11.5–15.5)
WBC: 5.6 10*3/uL (ref 4.0–10.5)

## 2012-11-24 NOTE — Progress Notes (Signed)
Subjective:     Patient ID: Bonnie Brown, female   DOB: [birth year], 32 y.o.   MRN: [id number]  HPI Bonnie Brown is back today to have labs drawn her liver function tests were elevated, and says right breast a little red, she is breastfeeding. She did have a blood transfusion when she was being treated for Hodgkins lymphoma  Review of Systems Positives as above.   Reviewed past medical,surgical, social and family history. Reviewed medications and allergies.  Objective:   Physical Exam  Blood pressure 110/76, height 5\' 4"  (1.626 m), weight 160 lb (72.576 kg), currently breastfeeding.   Skin warm and dry. Breasts: no dominant mass or retraction , + milk from nipples, maybe a little pink at right nipple, Abdomen: soft, non tender, no HSM, DTRs 2= no clonus Assessment:    Elevated liver function test  Anemia   Plan:      Check CBC, CMP, acute hepatitis profile   Will talk in am  Return as scheduled Wipe nipples off before and after feedings get bigger shields

## 2012-11-24 NOTE — Patient Instructions (Addendum)
Follow as scheduled Wipe breast with water before and after feeding

## 2012-11-25 ENCOUNTER — Telehealth: Payer: Self-pay | Admitting: Adult Health

## 2012-11-25 LAB — COMPREHENSIVE METABOLIC PANEL
ALT: 32 U/L (ref 0–35)
AST: 19 U/L (ref 0–37)
Albumin: 3.4 g/dL — ABNORMAL LOW (ref 3.5–5.2)
Alkaline Phosphatase: 246 U/L — ABNORMAL HIGH (ref 39–117)
Calcium: 8.7 mg/dL (ref 8.4–10.5)
Chloride: 105 mEq/L (ref 96–112)
Creat: 0.7 mg/dL (ref 0.50–1.10)
Potassium: 4.6 mEq/L (ref 3.5–5.3)

## 2012-11-25 LAB — HEPATITIS PANEL, ACUTE
HCV Ab: NEGATIVE
Hep B C IgM: NEGATIVE
Hepatitis B Surface Ag: NEGATIVE

## 2012-11-25 NOTE — Telephone Encounter (Signed)
Left message about labs

## 2012-11-25 NOTE — Telephone Encounter (Signed)
Pt aware of labs  

## 2012-12-05 ENCOUNTER — Encounter: Payer: Self-pay | Admitting: Adult Health

## 2012-12-05 ENCOUNTER — Ambulatory Visit (INDEPENDENT_AMBULATORY_CARE_PROVIDER_SITE_OTHER): Payer: PRIVATE HEALTH INSURANCE | Admitting: Adult Health

## 2012-12-05 VITALS — BP 120/80 | Ht 64.0 in | Wt 153.5 lb

## 2012-12-05 DIAGNOSIS — O9081 Anemia of the puerperium: Secondary | ICD-10-CM

## 2012-12-05 DIAGNOSIS — D649 Anemia, unspecified: Secondary | ICD-10-CM

## 2012-12-05 LAB — CBC
HCT: 33.7 % — ABNORMAL LOW (ref 36.0–46.0)
MCH: 25.2 pg — ABNORMAL LOW (ref 26.0–34.0)
MCHC: 30.9 g/dL (ref 30.0–36.0)
RDW: 18.7 % — ABNORMAL HIGH (ref 11.5–15.5)

## 2012-12-05 NOTE — Progress Notes (Signed)
Subjective:     Patient ID: Bonnie Brown, female   DOB: 1981/04/26, 32 y.o.   MRN: [id number]  HPI Bonnie Brown is back in follow up, she has been anemic and had hypertension after delivery.She is still taking Integra, but is off all BP meds and she is starting to feel better.  Review of Systems All negative except some constipation and still tired a little but its better and she is smiling more today.   No sex yet  Reviewed past medicalsurgical, social and family history. Reviewed medications and allergies.   Objective:   Physical Exam Blood pressure 120/80, height 5\' 4"  (1.626 m), weight 153 lb 8 oz (69.627 kg), currently breastfeeding.Skin warm and dry. Lungs: clear to ausculation bilaterally. Cardiovascular: regular rate and rhythm.   No swelling in feet or ankles Assessment:      Anemia  Resolved hypertension    Plan:      Check CBC Continue meds Return 6/10 for postpartum check

## 2012-12-05 NOTE — Patient Instructions (Addendum)
Continue meds  Return June 10 for post partum

## 2012-12-16 ENCOUNTER — Ambulatory Visit (INDEPENDENT_AMBULATORY_CARE_PROVIDER_SITE_OTHER): Payer: PRIVATE HEALTH INSURANCE | Admitting: Women's Health

## 2012-12-16 ENCOUNTER — Encounter: Payer: Self-pay | Admitting: Women's Health

## 2012-12-16 VITALS — BP 110/88 | Ht 64.0 in | Wt 148.2 lb

## 2012-12-16 DIAGNOSIS — Z3480 Encounter for supervision of other normal pregnancy, unspecified trimester: Secondary | ICD-10-CM

## 2012-12-16 NOTE — Progress Notes (Signed)
Subjective:    Bonnie Brown is a 32 y.o. G14P1011 Caucasian female who presents for a postpartum visit. She is 6 weeks postpartum following a low cervical transverse Cesarean section. I have fully reviewed the prenatal and intrapartum course. The delivery was at 39 gestational weeks. Outcome: primary cesarean section, low vertical incision. Anesthesia: epidural.  She was induced for [facility] and progressed to complete dilation had a PLTCS d/t failure to descend. She developed severe blood pressures and was treated with magnesium infusion intrapartum and for 24 hours post-partum. She had a postpartum hemorrhage with significant drop in hemoglobin postpartum. However, she was asymptomatic and declined transfusion. Postpartum course has been complicated by pp HTN and anemia now resolved. She has been off of antihypertensives x ~4-5wks now. Baby's course has been uncomplicated. Baby is feeding by breast/bottle, states her milk production is decreased. Bleeding no bleeding. Bowel function is abnormal: constipation, bm q 3-4days. Bladder function is normal. Patient is not sexually active. Contraception method is none. States she will use condoms when she becomes sexually active, declines information on other methods. Postpartum depression screening: negative.  The following portions of the patient's history were reviewed and updated as appropriate: allergies, current medications, past medical history, past surgical history and problem list.  Review of Systems Pertinent items are noted in HPI.   Filed Vitals:   12/16/12 1141  BP: 110/88  Height: 5\' 4"  (1.626 m)  Weight: 148 lb 3.2 oz (67.223 kg)    Objective:     General:  alert, cooperative and no distress   Breasts:  deferred, no complaints  Lungs: clear to auscultation bilaterally  Heart:  regular rate and rhythm  Abdomen: soft, nontender   Vulva: normal  Vagina: normal vagina  Cervix:  closed  Corpus: Well-involuted  Adnexa:  Non-palpable   Rectal Exam: No hemorrhoids        Assessment:   Normal postpartum exam 6 wks s/p PLTCS Depression screening Contraception counseling   Plan:   Contraception: condoms Follow up in: 6 months for pap & physical or as needed.  Reviewed constipation relief measures Discussed measures to increase milk supply  Marge Duncans  12/16/12 @ 12:14 PM

## 2012-12-16 NOTE — Patient Instructions (Signed)
Constipation  Drink plenty of fluid, preferably water, throughout the day  Eat foods high in fiber such as fruits, vegetables, and grains  Exercise, such as walking, is a good way to keep your bowels regular  Drink warm fluids, especially warm prune juice, or decaf coffee  Eat a 1/2 cup of real oatmeal (not instant), 1/2 cup applesauce, and 1/2-1 cup warm prune juice every day  If needed, you may take Colace (docusate sodium) stool softener once or twice a day to help keep the stool soft. If you are pregnant, wait until you are out of your first trimester (12-14 weeks of pregnancy)  If you still are having problems with constipation, you may take Miralax once daily as needed to help keep your bowels regular.  If you are pregnant, wait until you are out of your first trimester (12-14 weeks of pregnancy)   Constipation, Adult Constipation is when a person has fewer than 3 bowel movements a week; has difficulty having a bowel movement; or has stools that are dry, hard, or larger than normal. As people grow older, constipation is more common. If you try to fix constipation with medicines that make you have a bowel movement (laxatives), the problem may get worse. Long-term laxative use may cause the muscles of the colon to become weak. A low-fiber diet, not taking in enough fluids, and taking certain medicines may make constipation worse. CAUSES   Certain medicines, such as antidepressants, pain medicine, iron supplements, antacids, and water pills.   Certain diseases, such as diabetes, irritable bowel syndrome (IBS), thyroid disease, or depression.   Not drinking enough water.   Not eating enough fiber-rich foods.   Stress or travel.  Lack of physical activity or exercise.  Not going to the restroom when there is the urge to have a bowel movement.  Ignoring the urge to have a bowel movement.  Using laxatives too much. SYMPTOMS   Having fewer than 3 bowel movements a week.    Straining to have a bowel movement.   Having hard, dry, or larger than normal stools.   Feeling full or bloated.   Pain in the lower abdomen.  Not feeling relief after having a bowel movement. DIAGNOSIS  Your caregiver will take a medical history and perform a physical exam. Further testing may be done for severe constipation. Some tests may include:   A barium enema X-ray to examine your rectum, colon, and sometimes, your small intestine.  A sigmoidoscopy to examine your lower colon.  A colonoscopy to examine your entire colon. TREATMENT  Treatment will depend on the severity of your constipation and what is causing it. Some dietary treatments include drinking more fluids and eating more fiber-rich foods. Lifestyle treatments may include regular exercise. If these diet and lifestyle recommendations do not help, your caregiver may recommend taking over-the-counter laxative medicines to help you have bowel movements. Prescription medicines may be prescribed if over-the-counter medicines do not work.  HOME CARE INSTRUCTIONS   Increase dietary fiber in your diet, such as fruits, vegetables, whole grains, and beans. Limit high-fat and processed sugars in your diet, such as [place] fries, hamburgers, cookies, candies, and soda.   A fiber supplement may be added to your diet if you cannot get enough fiber from foods.   Drink enough fluids to keep your urine clear or pale yellow.   Exercise regularly or as directed by your caregiver.   Go to the restroom when you have the urge to go. Do not hold  it.  Only take medicines as directed by your caregiver. Do not take other medicines for constipation without talking to your caregiver first. SEEK IMMEDIATE MEDICAL CARE IF:   You have bright red blood in your stool.   Your constipation lasts for more than 4 days or gets worse.   You have abdominal or rectal pain.   You have thin, pencil-like stools.  You have unexplained  weight loss. MAKE SURE YOU:   Understand these instructions.  Will watch your condition.  Will get help right away if you are not doing well or get worse. Document Released: 03/23/2004 Document Revised: 09/17/2011 Document Reviewed: 05/29/2011 [facility] Patient Information 2014 [place], [place].

## 2013-05-14 ENCOUNTER — Other Ambulatory Visit: Payer: Self-pay

## 2013-06-09 ENCOUNTER — Other Ambulatory Visit: Payer: PRIVATE HEALTH INSURANCE | Admitting: Adult Health

## 2013-06-19 ENCOUNTER — Other Ambulatory Visit (HOSPITAL_COMMUNITY)
Admission: RE | Admit: 2013-06-19 | Discharge: 2013-06-19 | Disposition: A | Discharge status: Home / Self Care | Payer: PRIVATE HEALTH INSURANCE | Source: Ambulatory Visit | Attending: Adult Health | Admitting: Adult Health

## 2013-06-19 ENCOUNTER — Encounter: Payer: Self-pay | Admitting: Adult Health

## 2013-06-19 ENCOUNTER — Ambulatory Visit (INDEPENDENT_AMBULATORY_CARE_PROVIDER_SITE_OTHER): Payer: PRIVATE HEALTH INSURANCE | Admitting: Adult Health

## 2013-06-19 VITALS — BP 120/84 | HR 76 | Ht 64.0 in | Wt 148.0 lb

## 2013-06-19 DIAGNOSIS — Z01419 Encounter for gynecological examination (general) (routine) without abnormal findings: Secondary | ICD-10-CM | POA: Insufficient documentation

## 2013-06-19 DIAGNOSIS — E039 Hypothyroidism, unspecified: Secondary | ICD-10-CM

## 2013-06-19 DIAGNOSIS — Z8571 Personal history of Hodgkin lymphoma: Secondary | ICD-10-CM

## 2013-06-19 DIAGNOSIS — Z1151 Encounter for screening for human papillomavirus (HPV): Secondary | ICD-10-CM | POA: Insufficient documentation

## 2013-06-19 NOTE — Progress Notes (Signed)
Patient ID: Bonnie Brown, female   DOB: [birth year], 32 y.o.   MRN: [id number] History of Present Illness: Bonnie Brown is a 32 year old white female,married in for a pap and physical.She has noticed a white to clear discharge.   Current Medications, Allergies, Past Medical History, Past Surgical History, Family History and Social History were reviewed in [employer] record.   Past Medical History  Diagnosis Date  . Hypothyroidism   . Cancer 1995    Hodgin's  . Hypertension in pregnancy, postpartum condition 11/14/2012  . Anemia   . Elevated liver enzymes 11/24/2012  . History of Hodgkin's disease 11/24/2012   Past Surgical History  Procedure Laterality Date  . Lymph node biopsy  1995  . Cesarean section N/A 11/04/2012    Procedure: Primary cesarean section with delivery of baby boy at 2007. apgars 8/9.;  Surgeon: Tereso Newcomer, MD;  Location: [facility] ORS;  Service: Obstetrics;  Laterality: N/A;  Current outpatient prescriptions:levothyroxine (SYNTHROID, LEVOTHROID) 50 MCG tablet, Take 50 mcg by mouth daily., Disp: , Rfl:   Review of Systems: Patient denies any headaches, blurred vision, shortness of breath, chest pain, abdominal pain, problems with bowel movements, urination, or intercourse. No joint pain or mood swings, desires another child.    Physical Exam:BP 120/84  Pulse 76  Ht 5\' 4"  (1.626 m)  Wt 148 lb (67.132 kg)  BMI 25.39 kg/m2  LMP 06/09/2013  Breastfeeding? No General:  Well developed, well nourished, no acute distress Skin:  Warm and dry Neck:  Midline trachea, enlarged thyroid Lungs; Clear to auscultation bilaterally Breast:  No dominant palpable mass, retraction, or nipple discharge Cardiovascular: Regular rate and rhythm Abdomen:  Soft, non tender, no hepatosplenomegaly, has C section scar Pelvic:  External genitalia is normal in appearance.  The vagina is normal in appearance.  The cervix is smooth, pap performed with HPV.  Uterus is felt  to be normal size, shape, and contour.  No                adnexal masses or tenderness noted. Extremities:  No swelling or varicosities noted Psych:  No mood changes, alert and cooperative,seems happy   Impression: Yearly gyn exam Hypothyroid History of Hodgkins    Plan: Physical in 1 year Follow up prn Take prenatals

## 2013-06-19 NOTE — Patient Instructions (Signed)
Physical in 1 year Follow up prn 

## 2013-07-06 ENCOUNTER — Other Ambulatory Visit: Payer: Self-pay | Admitting: Adult Health

## 2013-12-04 ENCOUNTER — Other Ambulatory Visit: Payer: PRIVATE HEALTH INSURANCE

## 2013-12-04 DIAGNOSIS — Z3200 Encounter for pregnancy test, result unknown: Secondary | ICD-10-CM

## 2013-12-05 ENCOUNTER — Telehealth: Payer: Self-pay | Admitting: Adult Health

## 2013-12-05 LAB — HCG, QUANTITATIVE, PREGNANCY: hCG, Beta Chain, Quant, S: 14113.1 m[IU]/mL

## 2013-12-05 NOTE — Telephone Encounter (Signed)
Pt aware of QHCG 14,113.1 is spotting some,pelvic rest and push fluids will add progesterone Monday.

## 2013-12-07 ENCOUNTER — Other Ambulatory Visit: Payer: Self-pay | Admitting: Obstetrics & Gynecology

## 2013-12-07 ENCOUNTER — Encounter: Payer: Self-pay | Admitting: Obstetrics & Gynecology

## 2013-12-07 ENCOUNTER — Ambulatory Visit (INDEPENDENT_AMBULATORY_CARE_PROVIDER_SITE_OTHER): Payer: PRIVATE HEALTH INSURANCE

## 2013-12-07 ENCOUNTER — Encounter: Payer: Self-pay | Admitting: Adult Health

## 2013-12-07 ENCOUNTER — Ambulatory Visit (INDEPENDENT_AMBULATORY_CARE_PROVIDER_SITE_OTHER): Payer: Self-pay | Admitting: Adult Health

## 2013-12-07 VITALS — BP 122/80 | Ht 64.0 in | Wt 159.0 lb

## 2013-12-07 DIAGNOSIS — O3680X0 Pregnancy with inconclusive fetal viability, not applicable or unspecified: Secondary | ICD-10-CM

## 2013-12-07 DIAGNOSIS — O09299 Supervision of pregnancy with other poor reproductive or obstetric history, unspecified trimester: Secondary | ICD-10-CM

## 2013-12-07 DIAGNOSIS — E039 Hypothyroidism, unspecified: Secondary | ICD-10-CM

## 2013-12-07 DIAGNOSIS — E079 Disorder of thyroid, unspecified: Secondary | ICD-10-CM

## 2013-12-07 DIAGNOSIS — O99280 Endocrine, nutritional and metabolic diseases complicating pregnancy, unspecified trimester: Secondary | ICD-10-CM

## 2013-12-07 DIAGNOSIS — Z3490 Encounter for supervision of normal pregnancy, unspecified, unspecified trimester: Secondary | ICD-10-CM

## 2013-12-07 NOTE — Progress Notes (Signed)
U/S-transvaginal u/s performed,single intrauterine gestational sac noted within +YS noted, no fetal pole noted on today's exam, cx appears closed, bilateral adnexa appears wnl with C.L. Noted on Lt, no free fluid noted within the pelvis, GS meas c/w 6+2wks

## 2013-12-07 NOTE — Patient Instructions (Signed)

## 2013-12-07 NOTE — Progress Notes (Signed)
Subjective:     Patient ID: Bonnie Brown, female   DOB: [birth year], 33 y.o.   MRN: [id number]  HPI Bonnie Brown is a 33 year old white female, married in for [place], she had a QHCG of 14,113.1 on 5/29.And she had some spotting Saturday, none now,this is a spontaneous pregnancy for her, she had IVF last time.She has history of hypothyroidism and Hodgkin's.  Review of Systems See HPI Reviewed past medical,surgical, social and family history. Reviewed medications and allergies.     Objective:   Physical Exam BP 122/80  Ht 5\' 4"  (1.626 m)  Wt 159 lb (72.122 kg)  BMI 27.28 kg/m2  LMP 12/02/2014Reviewed [place] with pt and husband.   US showed IUP with GS measuring 14.8 mm which is about 6+2 weeks, has +YS and CL on left, no fetal pole seen today.No spotting or cramping, has some nausea, declines meds. Blood type is A+.  Assessment:     Pregnant Hypothyroid     Plan:    Check TSH, QHCG and progesterone today  Return in 1 week for [place] and see me Review handout on first trimester

## 2013-12-08 ENCOUNTER — Other Ambulatory Visit: Payer: PRIVATE HEALTH INSURANCE

## 2013-12-08 ENCOUNTER — Telehealth: Payer: Self-pay | Admitting: Adult Health

## 2013-12-08 LAB — PROGESTERONE: Progesterone: 10.7 ng/mL

## 2013-12-08 LAB — TSH: TSH: 1.147 u[IU]/mL (ref 0.350–4.500)

## 2013-12-08 LAB — HCG, QUANTITATIVE, PREGNANCY: hCG, Beta Chain, Quant, S: 36107.4 m[IU]/mL

## 2013-12-08 NOTE — Telephone Encounter (Signed)
Pt aware no supplementation needed, left message

## 2013-12-09 ENCOUNTER — Other Ambulatory Visit: Payer: Self-pay | Admitting: Adult Health

## 2013-12-09 DIAGNOSIS — O09299 Supervision of pregnancy with other poor reproductive or obstetric history, unspecified trimester: Secondary | ICD-10-CM

## 2013-12-09 DIAGNOSIS — O3680X0 Pregnancy with inconclusive fetal viability, not applicable or unspecified: Secondary | ICD-10-CM

## 2013-12-14 ENCOUNTER — Other Ambulatory Visit: Payer: PRIVATE HEALTH INSURANCE

## 2013-12-14 ENCOUNTER — Ambulatory Visit: Payer: PRIVATE HEALTH INSURANCE | Admitting: Adult Health

## 2013-12-15 ENCOUNTER — Ambulatory Visit (INDEPENDENT_AMBULATORY_CARE_PROVIDER_SITE_OTHER): Payer: Self-pay | Admitting: Adult Health

## 2013-12-15 ENCOUNTER — Ambulatory Visit (INDEPENDENT_AMBULATORY_CARE_PROVIDER_SITE_OTHER): Payer: PRIVATE HEALTH INSURANCE

## 2013-12-15 ENCOUNTER — Encounter: Payer: Self-pay | Admitting: Adult Health

## 2013-12-15 ENCOUNTER — Other Ambulatory Visit: Payer: Self-pay | Admitting: Adult Health

## 2013-12-15 VITALS — BP 118/62 | Ht 64.0 in | Wt 159.0 lb

## 2013-12-15 DIAGNOSIS — Z3490 Encounter for supervision of normal pregnancy, unspecified, unspecified trimester: Secondary | ICD-10-CM

## 2013-12-15 DIAGNOSIS — O3680X0 Pregnancy with inconclusive fetal viability, not applicable or unspecified: Secondary | ICD-10-CM

## 2013-12-15 DIAGNOSIS — O09299 Supervision of pregnancy with other poor reproductive or obstetric history, unspecified trimester: Secondary | ICD-10-CM

## 2013-12-15 DIAGNOSIS — Z3480 Encounter for supervision of other normal pregnancy, unspecified trimester: Secondary | ICD-10-CM

## 2013-12-15 NOTE — Progress Notes (Signed)
Subjective:     Patient ID: Bonnie Brown, female   DOB: [birth year], 33 y.o.   MRN: [id number]  HPI Bonnie Brown is a 33 year old white female,married in for [place] for growth and to confirm heartbeat.She had some brown spotting yesterday. No complaints today.  Review of Systems See HPI Reviewed past medical,surgical, social and family history. Reviewed medications and allergies.     Objective:   Physical Exam BP 118/62  Ht 5\' 4"  (1.626 m)  Wt 159 lb (72.122 kg)  BMI 27.28 kg/m2  LMP 06/09/2013  Breastfeeding? NoReviewed [place] with pt and husband: the US showed  +FHM of 123 and the CRL= 6+4 weeks with EDC of 08/06/14,discussed no heavy lifting or risky behaviors.    Assessment:     Pregnant    Plan:    Continue prenatal vitamins  Return in 2 weeks for new OB and [place] Review handout on first tirmester   Call with any questions or concerns

## 2013-12-15 NOTE — Patient Instructions (Signed)
Return in 2 weeks for [place] and new oB Pregnancy - First Trimester During sexual intercourse, millions of sperm go into the vagina. Only 1 sperm will penetrate and fertilize the female egg while it is in the Fallopian tube. One week later, the fertilized egg implants into the wall of the uterus. An embryo begins to develop into a baby. At 6 to 8 weeks, the eyes and face are formed and the heartbeat can be seen on ultrasound. At the end of 12 weeks (first trimester), all the baby's organs are formed. Now that you are pregnant, you will want to do everything you can to have a healthy baby. Two of the most important things are to get good prenatal care and follow your caregiver's instructions. Prenatal care is all the medical care you receive before the baby's birth. It is given to prevent, find, and treat problems during the pregnancy and childbirth. PRENATAL EXAMS  During prenatal visits, your weight, blood pressure, and urine are checked. This is done to make sure you are healthy and progressing normally during the pregnancy.  A pregnant woman should gain 25 to 35 pounds during the pregnancy. However, if you are overweight or underweight, your caregiver will advise you regarding your weight.  Your caregiver will ask and answer questions for you.  Blood work, cervical cultures, other necessary tests, and a Pap test are done during your prenatal exams. These tests are done to check on your health and the probable health of your baby. Tests are strongly recommended and done for HIV with your permission. This is the virus that causes AIDS. These tests are done because medicines can be given to help prevent your baby from being born with this infection should you have been infected without knowing it. Blood work is also used to find out your blood type, previous infections, and follow your blood levels (hemoglobin).  Low hemoglobin (anemia) is common during pregnancy. Iron and vitamins are given to help prevent  this. Later in the pregnancy, blood tests for diabetes will be done along with any other tests if any problems develop.  You may need other tests to make sure you and the baby are doing well. CHANGES DURING THE FIRST TRIMESTER  Your body goes through many changes during pregnancy. They vary from person to person. Talk to your caregiver about changes you notice and are concerned about. Changes can include:  Your menstrual period stops.  The egg and sperm carry the genes that determine what you look like. Genes from you and your partner are forming a baby. The female genes determine whether the baby is a boy or a girl.  Your body increases in girth and you may feel bloated.  Feeling sick to your stomach (nauseous) and throwing up (vomiting). If the vomiting is uncontrollable, call your caregiver.  Your breasts will begin to enlarge and become tender.  Your nipples may stick out more and become darker.  The need to urinate more. Painful urination may mean you have a bladder infection.  Tiring easily.  Loss of appetite.  Cravings for certain kinds of food.  At first, you may gain or lose a couple of pounds.  You may have changes in your emotions from day to day (excited to be pregnant or concerned something may go wrong with the pregnancy and baby).  You may have more vivid and strange dreams. HOME CARE INSTRUCTIONS   It is very important to avoid all smoking, alcohol and non-prescribed drugs during your pregnancy.  These affect the formation and growth of the baby. Avoid chemicals while pregnant to ensure the delivery of a healthy infant.  Start your prenatal visits by the 12th week of pregnancy. They are usually scheduled monthly at first, then more often in the last 2 months before delivery. Keep your caregiver's appointments. Follow your caregiver's instructions regarding medicine use, blood and lab tests, exercise, and diet.  During pregnancy, you are providing food for you and  your baby. Eat regular, well-balanced meals. Choose foods such as meat, fish, milk and other low fat dairy products, vegetables, fruits, and whole-grain breads and cereals. Your caregiver will tell you of the ideal weight gain.  You can help morning sickness by keeping soda crackers at the bedside. Eat a couple before arising in the morning. You may want to use the crackers without salt on them.  Eating 4 to 5 small meals rather than 3 large meals a day also may help the nausea and vomiting.  Drinking liquids between meals instead of during meals also seems to help nausea and vomiting.  A physical sexual relationship may be continued throughout pregnancy if there are no other problems. Problems may be early (premature) leaking of amniotic fluid from the membranes, vaginal bleeding, or belly (abdominal) pain.  Exercise regularly if there are no restrictions. Check with your caregiver or physical therapist if you are unsure of the safety of some of your exercises. Greater weight gain will occur in the last 2 trimesters of pregnancy. Exercising will help:  Control your weight.  Keep you in shape.  Prepare you for labor and delivery.  Help you lose your pregnancy weight after you deliver your baby.  Wear a good support or jogging bra for breast tenderness during pregnancy. This may help if worn during sleep too.  Ask when prenatal classes are available. Begin classes when they are offered.  Do not use hot tubs, steam rooms, or saunas.  Wear your seat belt when driving. This protects you and your baby if you are in an accident.  Avoid raw meat, uncooked cheese, cat litter boxes, and soil used by cats throughout the pregnancy. These carry germs that can cause birth defects in the baby.  The first trimester is a good time to visit your dentist for your dental health. Getting your teeth cleaned is okay. Use a softer toothbrush and brush gently during pregnancy.  Ask for help if you have  financial, counseling, or nutritional needs during pregnancy. Your caregiver will be able to offer counseling for these needs as well as refer you for other special needs.  Do not take any medicines or herbs unless told by your caregiver.  Inform your caregiver if there is any mental or physical domestic violence.  Make a list of emergency phone numbers of family, friends, hospital, and police and fire departments.  Write down your questions. Take them to your prenatal visit.  Do not douche.  Do not cross your legs.  If you have to stand for long periods of time, rotate you feet or take small steps in a circle.  You may have more vaginal secretions that may require a sanitary pad. Do not use tampons or scented sanitary pads. MEDICINES AND DRUG USE IN PREGNANCY  Take prenatal vitamins as directed. The vitamin should contain 1 milligram of folic acid. Keep all vitamins out of reach of children. Only a couple vitamins or tablets containing iron may be fatal to a baby or young child when ingested.  Avoid  use of all medicines, including herbs, over-the-counter medicines, not prescribed or suggested by your caregiver. Only take over-the-counter or prescription medicines for pain, discomfort, or fever as directed by your caregiver. Do not use aspirin, ibuprofen, or naproxen unless directed by your caregiver.  Let your caregiver also know about herbs you may be using.  Alcohol is related to a number of birth defects. This includes fetal alcohol syndrome. All alcohol, in any form, should be avoided completely. Smoking will cause low birth rate and premature babies.  Street or illegal drugs are very harmful to the baby. They are absolutely forbidden. A baby born to an addicted mother will be addicted at birth. The baby will go through the same withdrawal an adult does.  Let your caregiver know about any medicines that you have to take and for what reason you take them. SEEK MEDICAL CARE IF:  You  have any concerns or worries during your pregnancy. It is better to call with your questions if you feel they cannot wait, rather than worry about them. SEEK IMMEDIATE MEDICAL CARE IF:   An unexplained oral temperature above 102 F (38.9 C) develops, or as your caregiver suggests.  You have leaking of fluid from the vagina (birth canal). If leaking membranes are suspected, take your temperature and inform your caregiver of this when you call.  There is vaginal spotting or bleeding. Notify your caregiver of the amount and how many pads are used.  You develop a bad smelling vaginal discharge with a change in the color.  You continue to feel sick to your stomach (nauseated) and have no relief from remedies suggested. You vomit blood or coffee ground-like materials.  You lose more than 2 pounds of weight in 1 week.  You gain more than 2 pounds of weight in 1 week and you notice swelling of your face, hands, feet, or legs.  You gain 5 pounds or more in 1 week (even if you do not have swelling of your hands, face, legs, or feet).  You get exposed to [place] measles and have never had them.  You are exposed to fifth disease or chickenpox.  You develop belly (abdominal) pain. Round ligament discomfort is a common non-cancerous (benign) cause of abdominal pain in pregnancy. Your caregiver still must evaluate this.  You develop headache, fever, diarrhea, pain with urination, or shortness of breath.  You fall or are in a car accident or have any kind of trauma.  There is mental or physical violence in your home. Document Released: 06/19/2001 Document Revised: 03/19/2012 Document Reviewed: 12/21/2008 [facility] Patient Information 2014 [place].

## 2013-12-15 NOTE — Progress Notes (Signed)
U/S(6+4wks)-single active fetus, CRL c/w 6+4wks EDD 08/06/2014, +FCA noted, FHR-123 bpm, cx appears closed, bilateral adnexa WNL

## 2013-12-23 ENCOUNTER — Other Ambulatory Visit: Payer: Self-pay | Admitting: Adult Health

## 2013-12-23 DIAGNOSIS — O09299 Supervision of pregnancy with other poor reproductive or obstetric history, unspecified trimester: Secondary | ICD-10-CM

## 2013-12-28 ENCOUNTER — Telehealth: Payer: Self-pay | Admitting: Adult Health

## 2013-12-28 MED ORDER — DOXYLAMINE-PYRIDOXINE 10-10 MG PO TBEC: DELAYED_RELEASE_TABLET | ORAL | Status: DC

## 2013-12-28 NOTE — Telephone Encounter (Signed)
Complains of N/V rx diclegis

## 2013-12-30 ENCOUNTER — Ambulatory Visit (INDEPENDENT_AMBULATORY_CARE_PROVIDER_SITE_OTHER): Payer: Self-pay | Admitting: Women's Health

## 2013-12-30 ENCOUNTER — Encounter: Payer: Self-pay | Admitting: Women's Health

## 2013-12-30 ENCOUNTER — Ambulatory Visit (INDEPENDENT_AMBULATORY_CARE_PROVIDER_SITE_OTHER): Payer: PRIVATE HEALTH INSURANCE

## 2013-12-30 VITALS — BP 110/70 | Wt 165.0 lb

## 2013-12-30 DIAGNOSIS — O09899 Supervision of other high risk pregnancies, unspecified trimester: Secondary | ICD-10-CM

## 2013-12-30 DIAGNOSIS — Z1389 Encounter for screening for other disorder: Secondary | ICD-10-CM

## 2013-12-30 DIAGNOSIS — O09291 Supervision of pregnancy with other poor reproductive or obstetric history, first trimester: Secondary | ICD-10-CM

## 2013-12-30 DIAGNOSIS — Z3481 Encounter for supervision of other normal pregnancy, first trimester: Secondary | ICD-10-CM

## 2013-12-30 DIAGNOSIS — Z36 Encounter for antenatal screening of mother: Secondary | ICD-10-CM

## 2013-12-30 DIAGNOSIS — O09299 Supervision of pregnancy with other poor reproductive or obstetric history, unspecified trimester: Secondary | ICD-10-CM | POA: Insufficient documentation

## 2013-12-30 DIAGNOSIS — E079 Disorder of thyroid, unspecified: Secondary | ICD-10-CM

## 2013-12-30 DIAGNOSIS — Z331 Pregnant state, incidental: Secondary | ICD-10-CM

## 2013-12-30 DIAGNOSIS — O34219 Maternal care for unspecified type scar from previous cesarean delivery: Secondary | ICD-10-CM | POA: Insufficient documentation

## 2013-12-30 DIAGNOSIS — E039 Hypothyroidism, unspecified: Secondary | ICD-10-CM

## 2013-12-30 DIAGNOSIS — O99280 Endocrine, nutritional and metabolic diseases complicating pregnancy, unspecified trimester: Secondary | ICD-10-CM

## 2013-12-30 LAB — POCT URINALYSIS DIPSTICK
Blood, UA: NEGATIVE
Glucose, UA: NEGATIVE
Ketones, UA: NEGATIVE
LEUKOCYTES UA: NEGATIVE
NITRITE UA: NEGATIVE

## 2013-12-30 LAB — CBC
HCT: 39.1 % (ref 36.0–46.0)
Hemoglobin: 12.9 g/dL (ref 12.0–15.0)
MCH: 30.1 pg (ref 26.0–34.0)
MCHC: 33 g/dL (ref 30.0–36.0)
MCV: 91.4 fL (ref 78.0–100.0)
PLATELETS: 381 10*3/uL (ref 150–400)
RBC: 4.28 MIL/uL (ref 3.87–5.11)
RDW: 14.2 % (ref 11.5–15.5)
WBC: 11.9 10*3/uL — ABNORMAL HIGH (ref 4.0–10.5)

## 2013-12-30 NOTE — Progress Notes (Signed)
U/S(8+5wks)-single IUP with +FCA noted, FHR- 178 bpm, cx appears closed, bilateral adnexa appears WNL, CRL c/w dates

## 2013-12-30 NOTE — Patient Instructions (Addendum)
Start 81mg  baby aspirin at 12wks  Nausea & Vomiting  Have saltine crackers or pretzels by your bed and eat a few bites before you raise your head out of bed in the morning  Eat small frequent meals throughout the day instead of large meals  Drink plenty of fluids throughout the day to stay hydrated, just don't drink a lot of fluids with your meals.  This can make your stomach fill up faster making you feel sick  Do not brush your teeth right after you eat  Products with real ginger are good for nausea, like ginger ale and ginger hard candy Make sure it says made with real ginger!  Sucking on sour candy like lemon heads is also good for nausea  If your prenatal vitamins make you nauseated, take them at night so you will sleep through the nausea  If you feel like you need medicine for the nausea & vomiting please let us know  If you are unable to keep any fluids or food down please let us know    Pregnancy - First Trimester During sexual intercourse, millions of sperm go into the vagina. Only 1 sperm will penetrate and fertilize the female egg while it is in the Fallopian tube. One week later, the fertilized egg implants into the wall of the uterus. An embryo begins to develop into a baby. At 6 to 8 weeks, the eyes and face are formed and the heartbeat can be seen on ultrasound. At the end of 12 weeks (first trimester), all the baby's organs are formed. Now that you are pregnant, you will want to do everything you can to have a healthy baby. Two of the most important things are to get good prenatal care and follow your caregiver's instructions. Prenatal care is all the medical care you receive before the baby's birth. It is given to prevent, find, and treat problems during the pregnancy and childbirth. PRENATAL EXAMS  During prenatal visits, your weight, blood pressure, and urine are checked. This is done to make sure you are healthy and progressing normally during the pregnancy.  A  pregnant woman should gain 25 to 35 pounds during the pregnancy. However, if you are overweight or underweight, your caregiver will advise you regarding your weight.  Your caregiver will ask and answer questions for you.  Blood work, cervical cultures, other necessary tests, and a Pap test are done during your prenatal exams. These tests are done to check on your health and the probable health of your baby. Tests are strongly recommended and done for HIV with your permission. This is the virus that causes AIDS. These tests are done because medicines can be given to help prevent your baby from being born with this infection should you have been infected without knowing it. Blood work is also used to find out your blood type, previous infections, and follow your blood levels (hemoglobin).  Low hemoglobin (anemia) is common during pregnancy. Iron and vitamins are given to help prevent this. Later in the pregnancy, blood tests for diabetes will be done along with any other tests if any problems develop.  You may need other tests to make sure you and the baby are doing well. CHANGES DURING THE FIRST TRIMESTER  Your body goes through many changes during pregnancy. They vary from person to person. Talk to your caregiver about changes you notice and are concerned about. Changes can include:  Your menstrual period stops.  The egg and sperm carry the genes that determine  what you look like. Genes from you and your partner are forming a baby. The female genes determine whether the baby is a boy or a girl.  Your body increases in girth and you may feel bloated.  Feeling sick to your stomach (nauseous) and throwing up (vomiting). If the vomiting is uncontrollable, call your caregiver.  Your breasts will begin to enlarge and become tender.  Your nipples may stick out more and become darker.  The need to urinate more. Painful urination may mean you have a bladder infection.  Tiring easily.  Loss of  appetite.  Cravings for certain kinds of food.  At first, you may gain or lose a couple of pounds.  You may have changes in your emotions from day to day (excited to be pregnant or concerned something may go wrong with the pregnancy and baby).  You may have more vivid and strange dreams. HOME CARE INSTRUCTIONS   It is very important to avoid all smoking, alcohol and non-prescribed drugs during your pregnancy. These affect the formation and growth of the baby. Avoid chemicals while pregnant to ensure the delivery of a healthy infant.  Start your prenatal visits by the 12th week of pregnancy. They are usually scheduled monthly at first, then more often in the last 2 months before delivery. Keep your caregiver's appointments. Follow your caregiver's instructions regarding medicine use, blood and lab tests, exercise, and diet.  During pregnancy, you are providing food for you and your baby. Eat regular, well-balanced meals. Choose foods such as meat, fish, milk and other low fat dairy products, vegetables, fruits, and whole-grain breads and cereals. Your caregiver will tell you of the ideal weight gain.  You can help morning sickness by keeping soda crackers at the bedside. Eat a couple before arising in the morning. You may want to use the crackers without salt on them.  Eating 4 to 5 small meals rather than 3 large meals a day also may help the nausea and vomiting.  Drinking liquids between meals instead of during meals also seems to help nausea and vomiting.  A physical sexual relationship may be continued throughout pregnancy if there are no other problems. Problems may be early (premature) leaking of amniotic fluid from the membranes, vaginal bleeding, or belly (abdominal) pain.  Exercise regularly if there are no restrictions. Check with your caregiver or physical therapist if you are unsure of the safety of some of your exercises. Greater weight gain will occur in the last 2 trimesters of  pregnancy. Exercising will help:  Control your weight.  Keep you in shape.  Prepare you for labor and delivery.  Help you lose your pregnancy weight after you deliver your baby.  Wear a good support or jogging bra for breast tenderness during pregnancy. This may help if worn during sleep too.  Ask when prenatal classes are available. Begin classes when they are offered.  Do not use hot tubs, steam rooms, or saunas.  Wear your seat belt when driving. This protects you and your baby if you are in an accident.  Avoid raw meat, uncooked cheese, cat litter boxes, and soil used by cats throughout the pregnancy. These carry germs that can cause birth defects in the baby.  The first trimester is a good time to visit your dentist for your dental health. Getting your teeth cleaned is okay. Use a softer toothbrush and brush gently during pregnancy.  Ask for help if you have financial, counseling, or nutritional needs during pregnancy. Your caregiver  will be able to offer counseling for these needs as well as refer you for other special needs.  Do not take any medicines or herbs unless told by your caregiver.  Inform your caregiver if there is any mental or physical domestic violence.  Make a list of emergency phone numbers of family, friends, hospital, and police and fire departments.  Write down your questions. Take them to your prenatal visit.  Do not douche.  Do not cross your legs.  If you have to stand for long periods of time, rotate you feet or take small steps in a circle.  You may have more vaginal secretions that may require a sanitary pad. Do not use tampons or scented sanitary pads. MEDICINES AND DRUG USE IN PREGNANCY  Take prenatal vitamins as directed. The vitamin should contain 1 milligram of folic acid. Keep all vitamins out of reach of children. Only a couple vitamins or tablets containing iron may be fatal to a baby or young child when ingested.  Avoid use of all  medicines, including herbs, over-the-counter medicines, not prescribed or suggested by your caregiver. Only take over-the-counter or prescription medicines for pain, discomfort, or fever as directed by your caregiver. Do not use aspirin, ibuprofen, or naproxen unless directed by your caregiver.  Let your caregiver also know about herbs you may be using.  Alcohol is related to a number of birth defects. This includes fetal alcohol syndrome. All alcohol, in any form, should be avoided completely. Smoking will cause low birth rate and premature babies.  Street or illegal drugs are very harmful to the baby. They are absolutely forbidden. A baby born to an addicted mother will be addicted at birth. The baby will go through the same withdrawal an adult does.  Let your caregiver know about any medicines that you have to take and for what reason you take them. SEEK MEDICAL CARE IF:  You have any concerns or worries during your pregnancy. It is better to call with your questions if you feel they cannot wait, rather than worry about them. SEEK IMMEDIATE MEDICAL CARE IF:   An unexplained oral temperature above 102 F (38.9 C) develops, or as your caregiver suggests.  You have leaking of fluid from the vagina (birth canal). If leaking membranes are suspected, take your temperature and inform your caregiver of this when you call.  There is vaginal spotting or bleeding. Notify your caregiver of the amount and how many pads are used.  You develop a bad smelling vaginal discharge with a change in the color.  You continue to feel sick to your stomach (nauseated) and have no relief from remedies suggested. You vomit blood or coffee ground-like materials.  You lose more than 2 pounds of weight in 1 week.  You gain more than 2 pounds of weight in 1 week and you notice swelling of your face, hands, feet, or legs.  You gain 5 pounds or more in 1 week (even if you do not have swelling of your hands, face, legs,  or feet).  You get exposed to [place] measles and have never had them.  You are exposed to fifth disease or chickenpox.  You develop belly (abdominal) pain. Round ligament discomfort is a common non-cancerous (benign) cause of abdominal pain in pregnancy. Your caregiver still must evaluate this.  You develop headache, fever, diarrhea, pain with urination, or shortness of breath.  You fall or are in a car accident or have any kind of trauma.  There is mental  or physical violence in your home. Document Released: 06/19/2001 Document Revised: 03/19/2012 Document Reviewed: 05/05/2013 [facility] Patient Information 2015 [place], [place]. This information is not intended to replace advice given to you by your health care provider. Make sure you discuss any questions you have with your health care provider.

## 2013-12-30 NOTE — Progress Notes (Signed)
Subjective:  Bonnie Brown is a 34 y.o. G31P1011 Caucasian female at [redacted]w[redacted]d by 6.4 wk u/s,  being seen today for her first obstetrical visit.  This was a spontaneous pregnancy, last pregnancy was result of IVF. Her obstetrical history is significant for IOL @ 39+wks for GHTN in 2014-developed severe pre-e intrapartum, prev c/s 2014 for FTD after 4.5hrs pushing- infant weighed 8lb 11.7oz, PPH after c/s w/ Hgb drop to 5.3-she was asymptomatic and declined transfusion, hypothyroidism on synthroid 13mcg QOD, h/o Hodgkin's lymphoma- in remission.  Taking diclegis for nausea- really helping! Pregnancy history fully reviewed. Discussed VBAC- desires RLTCS.   Patient reports no complaints. Denies vb, cramping, uti s/s, abnormal/malodorous vag d/c, or vulvovaginal itching/irritation.  BP 110/70  Wt 165 lb (74.844 kg)  LMP 06/09/2013  HISTORY: OB History  Gravida Para Term Preterm AB SAB TAB Ectopic Multiple Living  3 1 1  1 1    1     # Outcome Date GA Lbr Len/2nd Weight Sex Delivery Anes PTL Lv  3 CUR           2 TRM 11/04/12 [redacted]w[redacted]d 08:00 / 11:07 8 lb 11.7 oz (3.96 kg) M LTCS EPI  Y  1 SAB 2012             Past Medical History  Diagnosis Date  . Hypothyroidism   . Cancer 1995    Hodgin's  . Hypertension in pregnancy, postpartum condition 11/14/2012  . Anemia   . Elevated liver enzymes 11/24/2012  . History of Hodgkin's disease 11/24/2012  . Pregnant 12/07/2013   Past Surgical History  Procedure Laterality Date  . Lymph node biopsy  1995  . Cesarean section N/A 11/04/2012    Procedure: Primary cesarean section with delivery of baby boy at 2007. apgars 8/9.;  Surgeon: Osborne Oman, MD;  Location: [facility];  Service: Obstetrics;  Laterality: N/A;   Family History  Problem Relation Age of Onset  . Coronary artery disease Paternal Grandfather   . Diabetes Maternal Grandmother   . Stroke Maternal Grandmother   . Coronary artery disease Maternal Grandmother   . Cancer Maternal Grandmother      pancreatic  . Coronary artery disease Father   . Hypertension Brother   . Hypertension Mother     Exam   System:     General: Well developed & nourished, no acute distress   Skin: Warm & dry, normal coloration and turgor, no rashes   Neurologic: Alert & oriented, normal mood   Cardiovascular: Regular rate & rhythm   Respiratory: Effort & rate normal, LCTAB, acyanotic   Abdomen: Soft, non tender   Extremities: normal strength, tone   Thin prep pap smear not obtained, neg 06/2013  FHR: 173 via informal transabdominal u/s   Assessment:   Pregnancy: G3P1011 Patient Active Problem List   Diagnosis Date Noted  . Previous cesarean delivery, antepartum 12/30/2013    Priority: High  . Hx of preeclampsia, prior pregnancy, currently pregnant 12/30/2013    Priority: High  . History of postpartum hemorrhage, currently pregnant 12/30/2013    Priority: High  . Supervision of other high-risk pregnancy 12/30/2013    Priority: High  . Hypothyroidism     Priority: High  . History of Hodgkin's disease 11/24/2012    [redacted]w[redacted]d G3P1011 New OB visit N/V of pregnancy, much better w/ diclegis Prev c/s for FTD- desires RLTCS H/O PPH H/O GHTN-> severe pre-e Hypothyroidism on synthroid H/O hodgkin's lymphoma- in remission  Plan:  Initial labs drawn  Continue prenatal vitamins Problem list reviewed and updated Reviewed n/v relief measures and warning s/s to report, continue diclegis Reviewed recommended weight gain based on pre-gravid BMI Encouraged well-balanced diet Genetic Screening discussed Integrated Screen: requested Cystic fibrosis screening discussed declined Ultrasound discussed; fetal survey: requested Follow up in 4 weeks for 1st it/nt and visit [place] completed Continue synthroid 66mcg QOD Begin baby ASA at 12wks for h/o pre-e  Tawnya Crook CNM, [employer] 12/30/2013 4:14 PM

## 2013-12-31 LAB — URINALYSIS, MICROSCOPIC ONLY
CRYSTALS: NONE SEEN
Casts: NONE SEEN

## 2013-12-31 LAB — GC/CHLAMYDIA PROBE AMP
CT Probe RNA: NEGATIVE
GC Probe RNA: NEGATIVE

## 2013-12-31 LAB — URINALYSIS, ROUTINE W REFLEX MICROSCOPIC
Bilirubin Urine: NEGATIVE
Glucose, UA: NEGATIVE mg/dL
NITRITE: NEGATIVE
PH: 6 (ref 5.0–8.0)
PROTEIN: NEGATIVE mg/dL
Specific Gravity, Urine: 1.028 (ref 1.005–1.030)
UROBILINOGEN UA: 0.2 mg/dL (ref 0.0–1.0)

## 2013-12-31 LAB — ANTIBODY SCREEN: Antibody Screen: NEGATIVE

## 2013-12-31 LAB — HEPATITIS B SURFACE ANTIGEN: HEP B S AG: NEGATIVE

## 2013-12-31 LAB — RPR

## 2013-12-31 LAB — RUBELLA SCREEN: Rubella: 1.54 Index — ABNORMAL HIGH (ref <0.90)

## 2013-12-31 LAB — ABO AND RH: RH TYPE: POSITIVE

## 2013-12-31 LAB — TSH: TSH: 0.776 u[IU]/mL (ref 0.350–4.500)

## 2013-12-31 LAB — HIV ANTIBODY (ROUTINE TESTING W REFLEX): HIV: NONREACTIVE

## 2013-12-31 LAB — VARICELLA ZOSTER ANTIBODY, IGG: VARICELLA IGG: 1077 {index} — AB (ref <135.00)

## 2014-01-01 ENCOUNTER — Encounter: Payer: Self-pay | Admitting: Women's Health

## 2014-01-01 LAB — URINE CULTURE: Colony Count: >=100000

## 2014-01-27 ENCOUNTER — Ambulatory Visit (INDEPENDENT_AMBULATORY_CARE_PROVIDER_SITE_OTHER): Payer: PRIVATE HEALTH INSURANCE | Admitting: Advanced Practice Midwife

## 2014-01-27 ENCOUNTER — Ambulatory Visit (INDEPENDENT_AMBULATORY_CARE_PROVIDER_SITE_OTHER): Payer: PRIVATE HEALTH INSURANCE

## 2014-01-27 ENCOUNTER — Encounter: Payer: Self-pay | Admitting: Advanced Practice Midwife

## 2014-01-27 VITALS — BP 100/68 | Wt 168.0 lb

## 2014-01-27 DIAGNOSIS — Z36 Encounter for antenatal screening of mother: Secondary | ICD-10-CM

## 2014-01-27 DIAGNOSIS — O99280 Endocrine, nutritional and metabolic diseases complicating pregnancy, unspecified trimester: Secondary | ICD-10-CM

## 2014-01-27 DIAGNOSIS — Z1389 Encounter for screening for other disorder: Secondary | ICD-10-CM

## 2014-01-27 DIAGNOSIS — E079 Disorder of thyroid, unspecified: Secondary | ICD-10-CM

## 2014-01-27 DIAGNOSIS — Z331 Pregnant state, incidental: Secondary | ICD-10-CM

## 2014-01-27 DIAGNOSIS — Z029 Encounter for administrative examinations, unspecified: Secondary | ICD-10-CM

## 2014-01-27 DIAGNOSIS — O09899 Supervision of other high risk pregnancies, unspecified trimester: Secondary | ICD-10-CM

## 2014-01-27 DIAGNOSIS — Z87440 Personal history of urinary (tract) infections: Secondary | ICD-10-CM

## 2014-01-27 LAB — POCT URINALYSIS DIPSTICK
Blood, UA: NEGATIVE
Glucose, UA: NEGATIVE
Ketones, UA: NEGATIVE
Leukocytes, UA: NEGATIVE
Nitrite, UA: NEGATIVE
Protein, UA: NEGATIVE

## 2014-01-27 NOTE — Progress Notes (Signed)
High Risk Pregnancy Diagnosis(es):   hypothyroid  G3P1011 [redacted]w[redacted]d Estimated Date of Delivery: 08/06/14  Blood pressure 100/68, weight 168 lb (76.204 kg), last menstrual period 06/09/2013, not currently breastfeeding.  Urinalysis: Negative   HPI: Had NT/IT today--normal u/s.       BP weight and urine results all reviewed and noted. Patient reports good fetal movement, denies any bleeding and no rupture of membranes symptoms or regular contractions.  Fundal Height:  12 Fetal Heart rate:  147 Edema:  no  Patient is without complaints. All questions were answered.  Assessment:  [redacted]w[redacted]d,   Hypothyroid  Medication(s) Plans:  Continue Synthroid 82mcg;  Start ASA 81mg  qd for PreX prevention  Treatment Plan:  TSH next visit.  Repeat urine culture:  If still + lactobacilus treat for UTI per UTD  Follow up in 4 weeks for OB appt, 2nd IT

## 2014-01-27 NOTE — Progress Notes (Signed)
U/S(12+5wks)-single IUP with +FCA noted, FHR-163 bpm, cx appears closed, bilateral adnexa appears WNL, anterior Gr 0 placenta, CRL c/w dates, NB present, NT-1.61mm

## 2014-01-28 LAB — DRUG SCREEN, URINE, NO CONFIRMATION
AMPHETAMINE SCRN UR: NEGATIVE
Barbiturate Quant, Ur: NEGATIVE
Benzodiazepines.: NEGATIVE
Cocaine Metabolites: NEGATIVE
Creatinine,U: 237.9 mg/dL
MARIJUANA METABOLITE: NEGATIVE
METHADONE: NEGATIVE
Opiate Screen, Urine: NEGATIVE
Phencyclidine (PCP): NEGATIVE
Propoxyphene: NEGATIVE

## 2014-01-28 LAB — OXYCODONE SCREEN, UA, RFLX CONFIRM: OXYCODONE SCRN UR: NEGATIVE ng/mL

## 2014-01-29 LAB — URINE CULTURE

## 2014-02-01 ENCOUNTER — Telehealth: Payer: Self-pay | Admitting: Women's Health

## 2014-02-01 NOTE — Telephone Encounter (Signed)
Pt spoke with Derrek Monaco regarding urine results.

## 2014-02-02 LAB — MATERNAL SCREEN, INTEGRATED #1

## 2014-02-24 ENCOUNTER — Ambulatory Visit (INDEPENDENT_AMBULATORY_CARE_PROVIDER_SITE_OTHER): Payer: Self-pay | Admitting: Women's Health

## 2014-02-24 ENCOUNTER — Encounter: Payer: Self-pay | Admitting: Women's Health

## 2014-02-24 VITALS — BP 118/72 | Wt 172.0 lb

## 2014-02-24 DIAGNOSIS — O99282 Endocrine, nutritional and metabolic diseases complicating pregnancy, second trimester: Secondary | ICD-10-CM

## 2014-02-24 DIAGNOSIS — Z36 Encounter for antenatal screening of mother: Secondary | ICD-10-CM

## 2014-02-24 DIAGNOSIS — Z1389 Encounter for screening for other disorder: Secondary | ICD-10-CM

## 2014-02-24 DIAGNOSIS — Z1329 Encounter for screening for other suspected endocrine disorder: Secondary | ICD-10-CM

## 2014-02-24 DIAGNOSIS — E079 Disorder of thyroid, unspecified: Secondary | ICD-10-CM

## 2014-02-24 DIAGNOSIS — E039 Hypothyroidism, unspecified: Secondary | ICD-10-CM

## 2014-02-24 DIAGNOSIS — O09899 Supervision of other high risk pregnancies, unspecified trimester: Secondary | ICD-10-CM

## 2014-02-24 DIAGNOSIS — O99280 Endocrine, nutritional and metabolic diseases complicating pregnancy, unspecified trimester: Secondary | ICD-10-CM

## 2014-02-24 DIAGNOSIS — Z331 Pregnant state, incidental: Secondary | ICD-10-CM

## 2014-02-24 LAB — POCT URINALYSIS DIPSTICK
GLUCOSE UA: NEGATIVE
Ketones, UA: NEGATIVE
LEUKOCYTES UA: NEGATIVE
NITRITE UA: NEGATIVE
Protein, UA: NEGATIVE
RBC UA: NEGATIVE

## 2014-02-24 LAB — CBC
HEMATOCRIT: 36 % (ref 36.0–46.0)
Hemoglobin: 12.2 g/dL (ref 12.0–15.0)
MCH: 31.3 pg (ref 26.0–34.0)
MCHC: 33.9 g/dL (ref 30.0–36.0)
MCV: 92.3 fL (ref 78.0–100.0)
Platelets: 402 10*3/uL — ABNORMAL HIGH (ref 150–400)
RBC: 3.9 MIL/uL (ref 3.87–5.11)
RDW: 14.2 % (ref 11.5–15.5)
WBC: 14.3 10*3/uL — AB (ref 4.0–10.5)

## 2014-02-24 NOTE — Progress Notes (Signed)
High Risk Pregnancy Diagnosis(es): hypothyroidism G3P1011 [redacted]w[redacted]d Estimated Date of Delivery: 08/06/14 BP 118/72  Wt 172 lb (78.019 kg)  LMP 06/09/2013  Urinalysis: Negative HPI:  occ sees spots, no ha's, happens at random times. Eating lots of ice- wants hgb checked BP, weight, and urine reviewed.  No fm yet. Denies cramping, lof, vb, uti s/s.   Fundal Height:  16wks Fetal Heart rate:  145 informal u/s Edema:  none  Reviewed warning s/s to report, recommended increasing po fluids, small frequent meals/snacks All questions were answered Assessment: [redacted]w[redacted]d hypothyroidism Medication(s) Plans:  Continue synthroid and baby asa Treatment Plan:  Tsh, cbc, 2nd IT today Follow up in 3wks for high-risk OB appt and anatomy u/s 2nd IT today

## 2014-02-24 NOTE — Patient Instructions (Signed)
Second Trimester of Pregnancy The second trimester is from week 13 through week 28, months 4 through 6. The second trimester is often a time when you feel your best. Your body has also adjusted to being pregnant, and you begin to feel better physically. Usually, morning sickness has lessened or quit completely, you may have more energy, and you may have an increase in appetite. The second trimester is also a time when the fetus is growing rapidly. At the end of the sixth month, the fetus is about 9 inches long and weighs about 1 pounds. You will likely begin to feel the baby move (quickening) between 18 and 20 weeks of the pregnancy. BODY CHANGES Your body goes through many changes during pregnancy. The changes vary from woman to woman.   Your weight will continue to increase. You will notice your lower abdomen bulging out.  You may begin to get stretch marks on your hips, abdomen, and breasts.  You may develop headaches that can be relieved by medicines approved by your health care provider.  You may urinate more often because the fetus is pressing on your bladder.  You may develop or continue to have heartburn as a result of your pregnancy.  You may develop constipation because certain hormones are causing the muscles that push waste through your intestines to slow down.  You may develop hemorrhoids or swollen, bulging veins (varicose veins).  You may have back pain because of the weight gain and pregnancy hormones relaxing your joints between the bones in your pelvis and as a result of a shift in weight and the muscles that support your balance.  Your breasts will continue to grow and be tender.  Your gums may bleed and may be sensitive to brushing and flossing.  Dark spots or blotches (chloasma, mask of pregnancy) may develop on your face. This will likely fade after the baby is born.  A dark line from your belly button to the pubic area (linea nigra) may appear. This will likely fade  after the baby is born.  You may have changes in your hair. These can include thickening of your hair, rapid growth, and changes in texture. Some women also have hair loss during or after pregnancy, or hair that feels dry or thin. Your hair will most likely return to normal after your baby is born. WHAT TO EXPECT AT YOUR PRENATAL VISITS During a routine prenatal visit:  You will be weighed to make sure you and the fetus are growing normally.  Your blood pressure will be taken.  Your abdomen will be measured to track your baby's growth.  The fetal heartbeat will be listened to.  Any test results from the previous visit will be discussed. Your health care provider may ask you:  How you are feeling.  If you are feeling the baby move.  If you have had any abnormal symptoms, such as leaking fluid, bleeding, severe headaches, or abdominal cramping.  If you have any questions. Other tests that may be performed during your second trimester include:  Blood tests that check for:  Low iron levels (anemia).  Gestational diabetes (between 24 and 28 weeks).  Rh antibodies.  Urine tests to check for infections, diabetes, or protein in the urine.  An ultrasound to confirm the proper growth and development of the baby.  An amniocentesis to check for possible genetic problems.  Fetal screens for spina bifida and Down syndrome. HOME CARE INSTRUCTIONS   Avoid all smoking, herbs, alcohol, and unprescribed   drugs. These chemicals affect the formation and growth of the baby.  Follow your health care provider's instructions regarding medicine use. There are medicines that are either safe or unsafe to take during pregnancy.  Exercise only as directed by your health care provider. Experiencing uterine cramps is a good sign to stop exercising.  Continue to eat regular, healthy meals.  Wear a good support bra for breast tenderness.  Do not use hot tubs, steam rooms, or saunas.  Wear your  seat belt at all times when driving.  Avoid raw meat, uncooked cheese, cat litter boxes, and soil used by cats. These carry germs that can cause birth defects in the baby.  Take your prenatal vitamins.  Try taking a stool softener (if your health care provider approves) if you develop constipation. Eat more high-fiber foods, such as fresh vegetables or fruit and whole grains. Drink plenty of fluids to keep your urine clear or pale yellow.  Take warm sitz baths to soothe any pain or discomfort caused by hemorrhoids. Use hemorrhoid cream if your health care provider approves.  If you develop varicose veins, wear support hose. Elevate your feet for 15 minutes, 3-4 times a day. Limit salt in your diet.  Avoid heavy lifting, wear low heel shoes, and practice good posture.  Rest with your legs elevated if you have leg cramps or low back pain.  Visit your dentist if you have not gone yet during your pregnancy. Use a soft toothbrush to brush your teeth and be gentle when you floss.  A sexual relationship may be continued unless your health care provider directs you otherwise.  Continue to go to all your prenatal visits as directed by your health care provider. SEEK MEDICAL CARE IF:   You have dizziness.  You have mild pelvic cramps, pelvic pressure, or nagging pain in the abdominal area.  You have persistent nausea, vomiting, or diarrhea.  You have a bad smelling vaginal discharge.  You have pain with urination. SEEK IMMEDIATE MEDICAL CARE IF:   You have a fever.  You are leaking fluid from your vagina.  You have spotting or bleeding from your vagina.  You have severe abdominal cramping or pain.  You have rapid weight gain or loss.  You have shortness of breath with chest pain.  You notice sudden or extreme swelling of your face, hands, ankles, feet, or legs.  You have not felt your baby move in over an hour.  You have severe headaches that do not go away with  medicine.  You have vision changes. Document Released: 06/19/2001 Document Revised: 06/30/2013 Document Reviewed: 08/26/2012 [employer] Patient Information 2015 [employer]. This information is not intended to replace advice given to you by your health care provider. Make sure you discuss any questions you have with your health care provider.  

## 2014-02-25 LAB — TSH: TSH: 1.942 u[IU]/mL (ref 0.350–4.500)

## 2014-03-03 LAB — MATERNAL SCREEN, INTEGRATED #2
AFP MoM: 0.76
AFP, Serum: 25.9 ng/mL
Age risk Down Syndrome: 1:430 {titer}
CALCULATED GESTATIONAL AGE MAT SCREEN: 17
CROWN RUMP LENGTH MAT SCREEN 2: 70.4 mm
Estriol Mom: 1.21
Estriol, Free: 1.22 ng/mL
HCG, MOM MAT SCREEN: 0.75
HCG, SERUM MAT SCREEN: 19.9 [IU]/mL
Inhibin A Dimeric: 119 pg/mL
Inhibin A MoM: 0.77
MSS Down Syndrome: <1:5000 {titer}
NT MOM MAT SCREEN: 0.97
NUCHAL TRANSLUCENCY MAT SCREEN 2: 1.52 mm
Number of fetuses: 1
PAPP-A MOM MAT SCREEN: 0.98
PAPP-A: 629 ng/mL
Rish for ONTD: <1:5000 {titer}

## 2014-03-06 ENCOUNTER — Encounter: Payer: Self-pay | Admitting: Women's Health

## 2014-03-17 ENCOUNTER — Other Ambulatory Visit: Payer: PRIVATE HEALTH INSURANCE

## 2014-03-17 ENCOUNTER — Encounter: Payer: PRIVATE HEALTH INSURANCE | Admitting: Women's Health

## 2014-03-22 ENCOUNTER — Other Ambulatory Visit: Payer: Self-pay | Admitting: Women's Health

## 2014-03-22 ENCOUNTER — Ambulatory Visit (INDEPENDENT_AMBULATORY_CARE_PROVIDER_SITE_OTHER): Payer: PRIVATE HEALTH INSURANCE | Admitting: Women's Health

## 2014-03-22 ENCOUNTER — Ambulatory Visit (INDEPENDENT_AMBULATORY_CARE_PROVIDER_SITE_OTHER): Payer: PRIVATE HEALTH INSURANCE

## 2014-03-22 ENCOUNTER — Encounter: Payer: Self-pay | Admitting: Women's Health

## 2014-03-22 VITALS — BP 110/74 | Wt 178.0 lb

## 2014-03-22 DIAGNOSIS — E039 Hypothyroidism, unspecified: Secondary | ICD-10-CM

## 2014-03-22 DIAGNOSIS — O09299 Supervision of pregnancy with other poor reproductive or obstetric history, unspecified trimester: Secondary | ICD-10-CM

## 2014-03-22 DIAGNOSIS — O99280 Endocrine, nutritional and metabolic diseases complicating pregnancy, unspecified trimester: Secondary | ICD-10-CM

## 2014-03-22 DIAGNOSIS — O09899 Supervision of other high risk pregnancies, unspecified trimester: Secondary | ICD-10-CM

## 2014-03-22 DIAGNOSIS — Z331 Pregnant state, incidental: Secondary | ICD-10-CM

## 2014-03-22 DIAGNOSIS — E079 Disorder of thyroid, unspecified: Secondary | ICD-10-CM

## 2014-03-22 DIAGNOSIS — Z1389 Encounter for screening for other disorder: Secondary | ICD-10-CM

## 2014-03-22 LAB — POCT URINALYSIS DIPSTICK
Blood, UA: NEGATIVE
GLUCOSE UA: NEGATIVE
Ketones, UA: NEGATIVE
LEUKOCYTES UA: NEGATIVE
Nitrite, UA: NEGATIVE

## 2014-03-22 NOTE — Progress Notes (Signed)
U/S(20+3wks)-single active fetus, meas c/w dates,fluid wnl,anterior Gr 0 placenta, cx appears closed (3.4cm), bilateral adnexa appears WNL, FHR-141 bpm, female fetus, no major abnl noted

## 2014-03-22 NOTE — Progress Notes (Signed)
High Risk Pregnancy Diagnosis(es): hypothyroidism G3P1011 [redacted]w[redacted]d Estimated Date of Delivery: 08/06/14 BP 110/74  Wt 178 lb (80.74 kg)  LMP 06/09/2013  Urinalysis: Positive for trace protein HPI:  Doing well. Went to endocrinologist at [facility] at end of Aug for f/u, has some thyroid nodules that have enlarged slightly, MD thought r/t pregnancy, wants her to f/u in 19yr BP, weight, and urine reviewed.  Reports good fm. Denies regular uc's, lof, vb, uti s/s. No complaints.  Fundal Height:  20wks Fetal Heart rate:  141 u/s Edema:  none  Reviewed today's normal anatomy u/s, ptl s/s All questions were answered Assessment: [redacted]w[redacted]d hypothyroidism Medication(s) Plans:  Continue current synthroid dosage Treatment Plan:  tsh q trimester Follow up in 4wks for high-risk OB appt

## 2014-03-22 NOTE — Patient Instructions (Signed)
Second Trimester of Pregnancy The second trimester is from week 13 through week 28, months 4 through 6. The second trimester is often a time when you feel your best. Your body has also adjusted to being pregnant, and you begin to feel better physically. Usually, morning sickness has lessened or quit completely, you may have more energy, and you may have an increase in appetite. The second trimester is also a time when the fetus is growing rapidly. At the end of the sixth month, the fetus is about 9 inches long and weighs about 1 pounds. You will likely begin to feel the baby move (quickening) between 18 and 20 weeks of the pregnancy. BODY CHANGES Your body goes through many changes during pregnancy. The changes vary from woman to woman.   Your weight will continue to increase. You will notice your lower abdomen bulging out.  You may begin to get stretch marks on your hips, abdomen, and breasts.  You may develop headaches that can be relieved by medicines approved by your health care provider.  You may urinate more often because the fetus is pressing on your bladder.  You may develop or continue to have heartburn as a result of your pregnancy.  You may develop constipation because certain hormones are causing the muscles that push waste through your intestines to slow down.  You may develop hemorrhoids or swollen, bulging veins (varicose veins).  You may have back pain because of the weight gain and pregnancy hormones relaxing your joints between the bones in your pelvis and as a result of a shift in weight and the muscles that support your balance.  Your breasts will continue to grow and be tender.  Your gums may bleed and may be sensitive to brushing and flossing.  Dark spots or blotches (chloasma, mask of pregnancy) may develop on your face. This will likely fade after the baby is born.  A dark line from your belly button to the pubic area (linea nigra) may appear. This will likely fade  after the baby is born.  You may have changes in your hair. These can include thickening of your hair, rapid growth, and changes in texture. Some women also have hair loss during or after pregnancy, or hair that feels dry or thin. Your hair will most likely return to normal after your baby is born. WHAT TO EXPECT AT YOUR PRENATAL VISITS During a routine prenatal visit:  You will be weighed to make sure you and the fetus are growing normally.  Your blood pressure will be taken.  Your abdomen will be measured to track your baby's growth.  The fetal heartbeat will be listened to.  Any test results from the previous visit will be discussed. Your health care provider may ask you:  How you are feeling.  If you are feeling the baby move.  If you have had any abnormal symptoms, such as leaking fluid, bleeding, severe headaches, or abdominal cramping.  If you have any questions. Other tests that may be performed during your second trimester include:  Blood tests that check for:  Low iron levels (anemia).  Gestational diabetes (between 24 and 28 weeks).  Rh antibodies.  Urine tests to check for infections, diabetes, or protein in the urine.  An ultrasound to confirm the proper growth and development of the baby.  An amniocentesis to check for possible genetic problems.  Fetal screens for spina bifida and Down syndrome. HOME CARE INSTRUCTIONS   Avoid all smoking, herbs, alcohol, and unprescribed   drugs. These chemicals affect the formation and growth of the baby.  Follow your health care provider's instructions regarding medicine use. There are medicines that are either safe or unsafe to take during pregnancy.  Exercise only as directed by your health care provider. Experiencing uterine cramps is a good sign to stop exercising.  Continue to eat regular, healthy meals.  Wear a good support bra for breast tenderness.  Do not use hot tubs, steam rooms, or saunas.  Wear your  seat belt at all times when driving.  Avoid raw meat, uncooked cheese, cat litter boxes, and soil used by cats. These carry germs that can cause birth defects in the baby.  Take your prenatal vitamins.  Try taking a stool softener (if your health care provider approves) if you develop constipation. Eat more high-fiber foods, such as fresh vegetables or fruit and whole grains. Drink plenty of fluids to keep your urine clear or pale yellow.  Take warm sitz baths to soothe any pain or discomfort caused by hemorrhoids. Use hemorrhoid cream if your health care provider approves.  If you develop varicose veins, wear support hose. Elevate your feet for 15 minutes, 3-4 times a day. Limit salt in your diet.  Avoid heavy lifting, wear low heel shoes, and practice good posture.  Rest with your legs elevated if you have leg cramps or low back pain.  Visit your dentist if you have not gone yet during your pregnancy. Use a soft toothbrush to brush your teeth and be gentle when you floss.  A sexual relationship may be continued unless your health care provider directs you otherwise.  Continue to go to all your prenatal visits as directed by your health care provider. SEEK MEDICAL CARE IF:   You have dizziness.  You have mild pelvic cramps, pelvic pressure, or nagging pain in the abdominal area.  You have persistent nausea, vomiting, or diarrhea.  You have a bad smelling vaginal discharge.  You have pain with urination. SEEK IMMEDIATE MEDICAL CARE IF:   You have a fever.  You are leaking fluid from your vagina.  You have spotting or bleeding from your vagina.  You have severe abdominal cramping or pain.  You have rapid weight gain or loss.  You have shortness of breath with chest pain.  You notice sudden or extreme swelling of your face, hands, ankles, feet, or legs.  You have not felt your baby move in over an hour.  You have severe headaches that do not go away with  medicine.  You have vision changes. Document Released: 06/19/2001 Document Revised: 06/30/2013 Document Reviewed: 08/26/2012 [employer] Patient Information 2015 [employer]. This information is not intended to replace advice given to you by your health care provider. Make sure you discuss any questions you have with your health care provider.  

## 2014-03-24 ENCOUNTER — Other Ambulatory Visit: Payer: PRIVATE HEALTH INSURANCE

## 2014-03-24 ENCOUNTER — Encounter: Payer: PRIVATE HEALTH INSURANCE | Admitting: Women's Health

## 2014-04-19 ENCOUNTER — Encounter: Payer: PRIVATE HEALTH INSURANCE | Admitting: Women's Health

## 2014-04-26 ENCOUNTER — Ambulatory Visit (INDEPENDENT_AMBULATORY_CARE_PROVIDER_SITE_OTHER): Payer: PRIVATE HEALTH INSURANCE | Admitting: Adult Health

## 2014-04-26 ENCOUNTER — Encounter: Payer: Self-pay | Admitting: Adult Health

## 2014-04-26 VITALS — BP 122/84 | Wt 185.0 lb

## 2014-04-26 DIAGNOSIS — Z3492 Encounter for supervision of normal pregnancy, unspecified, second trimester: Secondary | ICD-10-CM

## 2014-04-26 DIAGNOSIS — G441 Vascular headache, not elsewhere classified: Secondary | ICD-10-CM

## 2014-04-26 DIAGNOSIS — M5432 Sciatica, left side: Secondary | ICD-10-CM | POA: Insufficient documentation

## 2014-04-26 DIAGNOSIS — Z1389 Encounter for screening for other disorder: Secondary | ICD-10-CM

## 2014-04-26 DIAGNOSIS — Z331 Pregnant state, incidental: Secondary | ICD-10-CM

## 2014-04-26 LAB — POCT URINALYSIS DIPSTICK
GLUCOSE UA: NEGATIVE
Ketones, UA: NEGATIVE
NITRITE UA: NEGATIVE
Protein, UA: NEGATIVE

## 2014-04-26 NOTE — Patient Instructions (Signed)
Sciatica Sciatica is pain, weakness, numbness, or tingling along the path of the sciatic nerve. The nerve starts in the lower back and runs down the back of each leg. The nerve controls the muscles in the lower leg and in the back of the knee, while also providing sensation to the back of the thigh, lower leg, and the sole of your foot. Sciatica is a symptom of another medical condition. For instance, nerve damage or certain conditions, such as a herniated disk or bone spur on the spine, pinch or put pressure on the sciatic nerve. This causes the pain, weakness, or other sensations normally associated with sciatica. Generally, sciatica only affects one side of the body. CAUSES   Herniated or slipped disc.  Degenerative disk disease.  A pain disorder involving the narrow muscle in the buttocks (piriformis syndrome).  Pelvic injury or fracture.  Pregnancy.  Tumor (rare). SYMPTOMS  Symptoms can vary from mild to very severe. The symptoms usually travel from the low back to the buttocks and down the back of the leg. Symptoms can include:  Mild tingling or dull aches in the lower back, leg, or hip.  Numbness in the back of the calf or sole of the foot.  Burning sensations in the lower back, leg, or hip.  Sharp pains in the lower back, leg, or hip.  Leg weakness.  Severe back pain inhibiting movement. These symptoms may get worse with coughing, sneezing, laughing, or prolonged sitting or standing. Also, being overweight may worsen symptoms. DIAGNOSIS  Your caregiver will perform a physical exam to look for common symptoms of sciatica. He or she may ask you to do certain movements or activities that would trigger sciatic nerve pain. Other tests may be performed to find the cause of the sciatica. These may include:  Blood tests.  X-rays.  Imaging tests, such as an MRI or CT scan. TREATMENT  Treatment is directed at the cause of the sciatic pain. Sometimes, treatment is not necessary  and the pain and discomfort goes away on its own. If treatment is needed, your caregiver may suggest:  Over-the-counter medicines to relieve pain.  Prescription medicines, such as anti-inflammatory medicine, muscle relaxants, or narcotics.  Applying heat or ice to the painful area.  Steroid injections to lessen pain, irritation, and inflammation around the nerve.  Reducing activity during periods of pain.  Exercising and stretching to strengthen your abdomen and improve flexibility of your spine. Your caregiver may suggest losing weight if the extra weight makes the back pain worse.  Physical therapy.  Surgery to eliminate what is pressing or pinching the nerve, such as a bone spur or part of a herniated disk. HOME CARE INSTRUCTIONS   Only take over-the-counter or prescription medicines for pain or discomfort as directed by your caregiver.  Apply ice to the affected area for 20 minutes, 3-4 times a day for the first 48-72 hours. Then try heat in the same way.  Exercise, stretch, or perform your usual activities if these do not aggravate your pain.  Attend physical therapy sessions as directed by your caregiver.  Keep all follow-up appointments as directed by your caregiver.  Do not wear high heels or shoes that do not provide proper support.  Check your mattress to see if it is too soft. A firm mattress may lessen your pain and discomfort. SEEK IMMEDIATE MEDICAL CARE IF:   You lose control of your bowel or bladder (incontinence).  You have increasing weakness in the lower back, pelvis, buttocks,  or legs.  You have redness or swelling of your back.  You have a burning sensation when you urinate.  You have pain that gets worse when you lie down or awakens you at night.  Your pain is worse than you have experienced in the past.  Your pain is lasting longer than 4 weeks.  You are suddenly losing weight without reason. MAKE SURE YOU:  Understand these  instructions.  Will watch your condition.  Will get help right away if you are not doing well or get worse. Document Released: 06/19/2001 Document Revised: 12/25/2011 Document Reviewed: 11/04/2011 [facility] Patient Information 2015 [place], [place]. This information is not intended to replace advice given to you by your health care provider. Make sure you discuss any questions you have with your health care provider. Second Trimester of Pregnancy The second trimester is from week 13 through week 28, months 4 through 6. The second trimester is often a time when you feel your best. Your body has also adjusted to being pregnant, and you begin to feel better physically. Usually, morning sickness has lessened or quit completely, you may have more energy, and you may have an increase in appetite. The second trimester is also a time when the fetus is growing rapidly. At the end of the sixth month, the fetus is about 9 inches long and weighs about 1 pounds. You will likely begin to feel the baby move (quickening) between 18 and 20 weeks of the pregnancy. BODY CHANGES Your body goes through many changes during pregnancy. The changes vary from woman to woman.   Your weight will continue to increase. You will notice your lower abdomen bulging out.  You may begin to get stretch marks on your hips, abdomen, and breasts.  You may develop headaches that can be relieved by medicines approved by your health care provider.  You may urinate more often because the fetus is pressing on your bladder.  You may develop or continue to have heartburn as a result of your pregnancy.  You may develop constipation because certain hormones are causing the muscles that push waste through your intestines to slow down.  You may develop hemorrhoids or swollen, bulging veins (varicose veins).  You may have back pain because of the weight gain and pregnancy hormones relaxing your joints between the bones in your pelvis and as a  result of a shift in weight and the muscles that support your balance.  Your breasts will continue to grow and be tender.  Your gums may bleed and may be sensitive to brushing and flossing.  Dark spots or blotches (chloasma, mask of pregnancy) may develop on your face. This will likely fade after the baby is born.  A dark line from your belly button to the pubic area (linea nigra) may appear. This will likely fade after the baby is born.  You may have changes in your hair. These can include thickening of your hair, rapid growth, and changes in texture. Some women also have hair loss during or after pregnancy, or hair that feels dry or thin. Your hair will most likely return to normal after your baby is born. WHAT TO EXPECT AT YOUR PRENATAL VISITS During a routine prenatal visit:  You will be weighed to make sure you and the fetus are growing normally.  Your blood pressure will be taken.  Your abdomen will be measured to track your baby's growth.  The fetal heartbeat will be listened to.  Any test results from the previous  visit will be discussed. Your health care provider may ask you:  How you are feeling.  If you are feeling the baby move.  If you have had any abnormal symptoms, such as leaking fluid, bleeding, severe headaches, or abdominal cramping.  If you have any questions. Other tests that may be performed during your second trimester include:  Blood tests that check for:  Low iron levels (anemia).  Gestational diabetes (between 24 and 28 weeks).  Rh antibodies.  Urine tests to check for infections, diabetes, or protein in the urine.  An ultrasound to confirm the proper growth and development of the baby.  An amniocentesis to check for possible genetic problems.  Fetal screens for spina bifida and Down syndrome. HOME CARE INSTRUCTIONS   Avoid all smoking, herbs, alcohol, and unprescribed drugs. These chemicals affect the formation and growth of the  baby.  Follow your health care provider's instructions regarding medicine use. There are medicines that are either safe or unsafe to take during pregnancy.  Exercise only as directed by your health care provider. Experiencing uterine cramps is a good sign to stop exercising.  Continue to eat regular, healthy meals.  Wear a good support bra for breast tenderness.  Do not use hot tubs, steam rooms, or saunas.  Wear your seat belt at all times when driving.  Avoid raw meat, uncooked cheese, cat litter boxes, and soil used by cats. These carry germs that can cause birth defects in the baby.  Take your prenatal vitamins.  Try taking a stool softener (if your health care provider approves) if you develop constipation. Eat more high-fiber foods, such as fresh vegetables or fruit and whole grains. Drink plenty of fluids to keep your urine clear or pale yellow.  Take warm sitz baths to soothe any pain or discomfort caused by hemorrhoids. Use hemorrhoid cream if your health care provider approves.  If you develop varicose veins, wear support hose. Elevate your feet for 15 minutes, 3-4 times a day. Limit salt in your diet.  Avoid heavy lifting, wear low heel shoes, and practice good posture.  Rest with your legs elevated if you have leg cramps or low back pain.  Visit your dentist if you have not gone yet during your pregnancy. Use a soft toothbrush to brush your teeth and be gentle when you floss.  A sexual relationship may be continued unless your health care provider directs you otherwise.  Continue to go to all your prenatal visits as directed by your health care provider. SEEK MEDICAL CARE IF:   You have dizziness.  You have mild pelvic cramps, pelvic pressure, or nagging pain in the abdominal area.  You have persistent nausea, vomiting, or diarrhea.  You have a bad smelling vaginal discharge.  You have pain with urination. SEEK IMMEDIATE MEDICAL CARE IF:   You have a  fever.  You are leaking fluid from your vagina.  You have spotting or bleeding from your vagina.  You have severe abdominal cramping or pain.  You have rapid weight gain or loss.  You have shortness of breath with chest pain.  You notice sudden or extreme swelling of your face, hands, ankles, feet, or legs.  You have not felt your baby move in over an hour.  You have severe headaches that do not go away with medicine.  You have vision changes. Document Released: 06/19/2001 Document Revised: 06/30/2013 Document Reviewed: 08/26/2012 [facility] Patient Information 2015 [place], [place]. This information is not intended to replace advice given to  to you by your health care provider. Make sure you discuss any questions you have with your health care provider.  

## 2014-04-26 NOTE — Progress Notes (Signed)
High Risk Pregnancy Diagnosis(es):  hypthyroidism  G3P1011 [redacted]w[redacted]d Estimated Date of Delivery: 08/06/14  Blood pressure 122/84, weight 185 lb (83.915 kg), last menstrual period 06/09/2013, not currently breastfeeding. BP recheck 110/74 in left arm. Urinalysis: Positive for trace blood and 2+ leuks      HPI: Bonnie Brown is complaining of headache and seeing spots,stopped ASA, but will restart, DTRs 2+ no clonus, No RUQ pain, has sciatic pain on left and has had some pain since last delivery after epidural.   BP weight and urine results all reviewed and noted. Patient reports good fetal movement, denies any bleeding and no rupture of membranes symptoms or regular contractions.  Fundal Height:  25 Fetal Heart rate:  152 Edema:  Hands swelling  Patient is without complaints. All questions were answered.  Assessment:  [redacted]w[redacted]d,     Medication(s) Plans:  Resume ASA  Treatment Plan:  UA C&S sent, check CBC and CMP Try ice and rest for left sciatic pain and will recheck BP in 1 week, can take tylenol for headache Follow up in 1 weeks for OB appt, with Maudie Mercury

## 2014-04-26 NOTE — Addendum Note (Signed)
Addended by: Derrek Monaco A on: 04/26/2014 04:36 PM   Modules accepted: Orders

## 2014-04-27 ENCOUNTER — Telehealth: Payer: Self-pay | Admitting: Adult Health

## 2014-04-27 LAB — COMPREHENSIVE METABOLIC PANEL
ALK PHOS: 59 U/L (ref 39–117)
ALT: 9 U/L (ref 0–35)
AST: 12 U/L (ref 0–37)
Albumin: 3.5 g/dL (ref 3.5–5.2)
BUN: 9 mg/dL (ref 6–23)
CO2: 25 mEq/L (ref 19–32)
Calcium: 9.1 mg/dL (ref 8.4–10.5)
Chloride: 103 mEq/L (ref 96–112)
Creat: 0.5 mg/dL (ref 0.50–1.10)
GLUCOSE: 61 mg/dL — AB (ref 70–99)
Potassium: 4.5 mEq/L (ref 3.5–5.3)
Sodium: 136 mEq/L (ref 135–145)
Total Bilirubin: 0.2 mg/dL (ref 0.2–1.2)
Total Protein: 6 g/dL (ref 6.0–8.3)

## 2014-04-27 LAB — URINE CULTURE

## 2014-04-27 LAB — CBC
HCT: 34.9 % — ABNORMAL LOW (ref 36.0–46.0)
Hemoglobin: 11.5 g/dL — ABNORMAL LOW (ref 12.0–15.0)
MCH: 30.7 pg (ref 26.0–34.0)
MCHC: 33 g/dL (ref 30.0–36.0)
MCV: 93.1 fL (ref 78.0–100.0)
Platelets: 433 10*3/uL — ABNORMAL HIGH (ref 150–400)
RBC: 3.75 MIL/uL — AB (ref 3.87–5.11)
RDW: 13 % (ref 11.5–15.5)
WBC: 13 10*3/uL — ABNORMAL HIGH (ref 4.0–10.5)

## 2014-04-27 LAB — URINALYSIS
BILIRUBIN URINE: NEGATIVE
Glucose, UA: NEGATIVE mg/dL
Hgb urine dipstick: NEGATIVE
KETONES UR: NEGATIVE mg/dL
Nitrite: NEGATIVE
Protein, ur: NEGATIVE mg/dL
SPECIFIC GRAVITY, URINE: 1.03 (ref 1.005–1.030)
Urobilinogen, UA: 0.2 mg/dL (ref 0.0–1.0)
pH: 6 (ref 5.0–8.0)

## 2014-04-27 NOTE — Telephone Encounter (Signed)
Left message about labs

## 2014-05-03 ENCOUNTER — Ambulatory Visit (INDEPENDENT_AMBULATORY_CARE_PROVIDER_SITE_OTHER): Payer: PRIVATE HEALTH INSURANCE | Admitting: Women's Health

## 2014-05-03 ENCOUNTER — Encounter: Payer: Self-pay | Admitting: Women's Health

## 2014-05-03 VITALS — BP 114/72 | Wt 186.0 lb

## 2014-05-03 DIAGNOSIS — Z1389 Encounter for screening for other disorder: Secondary | ICD-10-CM

## 2014-05-03 DIAGNOSIS — O09892 Supervision of other high risk pregnancies, second trimester: Secondary | ICD-10-CM

## 2014-05-03 DIAGNOSIS — Z331 Pregnant state, incidental: Secondary | ICD-10-CM

## 2014-05-03 DIAGNOSIS — E039 Hypothyroidism, unspecified: Secondary | ICD-10-CM

## 2014-05-03 DIAGNOSIS — O99282 Endocrine, nutritional and metabolic diseases complicating pregnancy, second trimester: Secondary | ICD-10-CM

## 2014-05-03 LAB — POCT URINALYSIS DIPSTICK
Blood, UA: NEGATIVE
GLUCOSE UA: NEGATIVE
Ketones, UA: NEGATIVE
Leukocytes, UA: NEGATIVE
Nitrite, UA: NEGATIVE
Protein, UA: NEGATIVE

## 2014-05-03 NOTE — Progress Notes (Signed)
High Risk Pregnancy Diagnosis(es): hypothyroidism G3P1011 [redacted]w[redacted]d Estimated Date of Delivery: 08/06/14 BP 114/72  Wt 186 lb (84.369 kg)  LMP 06/09/2013  Urinalysis: Negative HPI:  Still seeing spots randomly, lasts few seconds. No ha's. Thinks it may be her blood sugar getting too low. No dizziness/lightheadedness. Last eye exam before pregnancy.  To try adding proteins to snacks, increasing fluids to see if helps.  BP, weight, and urine reviewed.  Reports good fm. Denies regular uc's, lof, vb, uti s/s.   Fundal Height:  25 Fetal Heart rate:  144 Edema:  trace  Reviewed ptl s/s, fkc All questions were answered Assessment: [redacted]w[redacted]d hypothyroidism  Medication(s) Plans:  Continue synthroid, continue asa for h/o pre-e Treatment Plan:  Check TSH w/ pn2 labs Follow up in 2wks for high-risk OB appt and pn2

## 2014-05-03 NOTE — Patient Instructions (Signed)
Try snacks with protein: peanut butter, nuts, cheese  You will have your sugar test next visit.  Please do not eat or drink anything after midnight the night before you come, not even water.  You will be here for at least two hours.     Call the office [phone]) or go to [facility] if:  You begin to have strong, frequent contractions  Your water breaks.  Sometimes it is a big gush of fluid, sometimes it is just a trickle that keeps getting your panties wet or running down your legs  You have vaginal bleeding.  It is normal to have a small amount of spotting if your cervix was checked.   You don't feel your baby moving like normal.  If you don't, get you something to eat and drink and lay down and focus on feeling your baby move.    Second Trimester of Pregnancy The second trimester is from week 13 through week 28, months 4 through 6. The second trimester is often a time when you feel your best. Your body has also adjusted to being pregnant, and you begin to feel better physically. Usually, morning sickness has lessened or quit completely, you may have more energy, and you may have an increase in appetite. The second trimester is also a time when the fetus is growing rapidly. At the end of the sixth month, the fetus is about 9 inches long and weighs about 1 pounds. You will likely begin to feel the baby move (quickening) between 18 and 20 weeks of the pregnancy. BODY CHANGES Your body goes through many changes during pregnancy. The changes vary from woman to woman.   Your weight will continue to increase. You will notice your lower abdomen bulging out.  You may begin to get stretch marks on your hips, abdomen, and breasts.  You may develop headaches that can be relieved by medicines approved by your health care provider.  You may urinate more often because the fetus is pressing on your bladder.  You may develop or continue to have heartburn as a result of your pregnancy.  You may  develop constipation because certain hormones are causing the muscles that push waste through your intestines to slow down.  You may develop hemorrhoids or swollen, bulging veins (varicose veins).  You may have back pain because of the weight gain and pregnancy hormones relaxing your joints between the bones in your pelvis and as a result of a shift in weight and the muscles that support your balance.  Your breasts will continue to grow and be tender.  Your gums may bleed and may be sensitive to brushing and flossing.  Dark spots or blotches (chloasma, mask of pregnancy) may develop on your face. This will likely fade after the baby is born.  A dark line from your belly button to the pubic area (linea nigra) may appear. This will likely fade after the baby is born.  You may have changes in your hair. These can include thickening of your hair, rapid growth, and changes in texture. Some women also have hair loss during or after pregnancy, or hair that feels dry or thin. Your hair will most likely return to normal after your baby is born. WHAT TO EXPECT AT YOUR PRENATAL VISITS During a routine prenatal visit:  You will be weighed to make sure you and the fetus are growing normally.  Your blood pressure will be taken.  Your abdomen will be measured to track your baby's growth.  The fetal heartbeat will be listened to.  Any test results from the previous visit will be discussed. Your health care provider may ask you:  How you are feeling.  If you are feeling the baby move.  If you have had any abnormal symptoms, such as leaking fluid, bleeding, severe headaches, or abdominal cramping.  If you have any questions. Other tests that may be performed during your second trimester include:  Blood tests that check for:  Low iron levels (anemia).  Gestational diabetes (between 24 and 28 weeks).  Rh antibodies.  Urine tests to check for infections, diabetes, or protein in the  urine.  An ultrasound to confirm the proper growth and development of the baby.  An amniocentesis to check for possible genetic problems.  Fetal screens for spina bifida and Down syndrome. HOME CARE INSTRUCTIONS   Avoid all smoking, herbs, alcohol, and unprescribed drugs. These chemicals affect the formation and growth of the baby.  Follow your health care provider's instructions regarding medicine use. There are medicines that are either safe or unsafe to take during pregnancy.  Exercise only as directed by your health care provider. Experiencing uterine cramps is a good sign to stop exercising.  Continue to eat regular, healthy meals.  Wear a good support bra for breast tenderness.  Do not use hot tubs, steam rooms, or saunas.  Wear your seat belt at all times when driving.  Avoid raw meat, uncooked cheese, cat litter boxes, and soil used by cats. These carry germs that can cause birth defects in the baby.  Take your prenatal vitamins.  Try taking a stool softener (if your health care provider approves) if you develop constipation. Eat more high-fiber foods, such as fresh vegetables or fruit and whole grains. Drink plenty of fluids to keep your urine clear or pale yellow.  Take warm sitz baths to soothe any pain or discomfort caused by hemorrhoids. Use hemorrhoid cream if your health care provider approves.  If you develop varicose veins, wear support hose. Elevate your feet for 15 minutes, 3-4 times a day. Limit salt in your diet.  Avoid heavy lifting, wear low heel shoes, and practice good posture.  Rest with your legs elevated if you have leg cramps or low back pain.  Visit your dentist if you have not gone yet during your pregnancy. Use a soft toothbrush to brush your teeth and be gentle when you floss.  A sexual relationship may be continued unless your health care provider directs you otherwise.  Continue to go to all your prenatal visits as directed by your health  care provider. SEEK MEDICAL CARE IF:   You have dizziness.  You have mild pelvic cramps, pelvic pressure, or nagging pain in the abdominal area.  You have persistent nausea, vomiting, or diarrhea.  You have a bad smelling vaginal discharge.  You have pain with urination. SEEK IMMEDIATE MEDICAL CARE IF:   You have a fever.  You are leaking fluid from your vagina.  You have spotting or bleeding from your vagina.  You have severe abdominal cramping or pain.  You have rapid weight gain or loss.  You have shortness of breath with chest pain.  You notice sudden or extreme swelling of your face, hands, ankles, feet, or legs.  You have not felt your baby move in over an hour.  You have severe headaches that do not go away with medicine.  You have vision changes. Document Released: 06/19/2001 Document Revised: 06/30/2013 Document Reviewed: 08/26/2012 [employer] Patient  Information 2015 [employer]. This information is not intended to replace advice given to you by your health care provider. Make sure you discuss any questions you have with your health care provider.  

## 2014-05-10 ENCOUNTER — Encounter: Payer: Self-pay | Admitting: Women's Health

## 2014-05-11 DIAGNOSIS — C8190 Hodgkin lymphoma, unspecified, unspecified site: Secondary | ICD-10-CM | POA: Insufficient documentation

## 2014-05-11 DIAGNOSIS — N979 Female infertility, unspecified: Secondary | ICD-10-CM | POA: Insufficient documentation

## 2014-05-17 ENCOUNTER — Encounter: Payer: PRIVATE HEALTH INSURANCE | Admitting: Women's Health

## 2014-05-17 ENCOUNTER — Other Ambulatory Visit: Payer: PRIVATE HEALTH INSURANCE

## 2014-05-19 ENCOUNTER — Ambulatory Visit (INDEPENDENT_AMBULATORY_CARE_PROVIDER_SITE_OTHER): Payer: PRIVATE HEALTH INSURANCE | Admitting: Obstetrics and Gynecology

## 2014-05-19 ENCOUNTER — Encounter: Payer: Self-pay | Admitting: Obstetrics and Gynecology

## 2014-05-19 ENCOUNTER — Other Ambulatory Visit: Payer: PRIVATE HEALTH INSURANCE

## 2014-05-19 VITALS — BP 116/72 | Wt 187.0 lb

## 2014-05-19 DIAGNOSIS — E039 Hypothyroidism, unspecified: Secondary | ICD-10-CM

## 2014-05-19 DIAGNOSIS — Z113 Encounter for screening for infections with a predominantly sexual mode of transmission: Secondary | ICD-10-CM

## 2014-05-19 DIAGNOSIS — O34219 Maternal care for unspecified type scar from previous cesarean delivery: Secondary | ICD-10-CM

## 2014-05-19 DIAGNOSIS — Z331 Pregnant state, incidental: Secondary | ICD-10-CM

## 2014-05-19 DIAGNOSIS — O99282 Endocrine, nutritional and metabolic diseases complicating pregnancy, second trimester: Secondary | ICD-10-CM

## 2014-05-19 DIAGNOSIS — Z1389 Encounter for screening for other disorder: Secondary | ICD-10-CM

## 2014-05-19 DIAGNOSIS — O3421 Maternal care for scar from previous cesarean delivery: Secondary | ICD-10-CM

## 2014-05-19 DIAGNOSIS — Z131 Encounter for screening for diabetes mellitus: Secondary | ICD-10-CM

## 2014-05-19 DIAGNOSIS — Z3483 Encounter for supervision of other normal pregnancy, third trimester: Secondary | ICD-10-CM

## 2014-05-19 DIAGNOSIS — Z114 Encounter for screening for human immunodeficiency virus [HIV]: Secondary | ICD-10-CM

## 2014-05-19 DIAGNOSIS — Z0184 Encounter for antibody response examination: Secondary | ICD-10-CM

## 2014-05-19 LAB — POCT URINALYSIS DIPSTICK
Glucose, UA: NEGATIVE
KETONES UA: NEGATIVE
Nitrite, UA: NEGATIVE
Protein, UA: NEGATIVE

## 2014-05-19 LAB — CBC
HCT: 36.3 % (ref 36.0–46.0)
HEMOGLOBIN: 11.8 g/dL — AB (ref 12.0–15.0)
MCH: 30 pg (ref 26.0–34.0)
MCHC: 32.5 g/dL (ref 30.0–36.0)
MCV: 92.4 fL (ref 78.0–100.0)
PLATELETS: 397 10*3/uL (ref 150–400)
RBC: 3.93 MIL/uL (ref 3.87–5.11)
RDW: 13.4 % (ref 11.5–15.5)
WBC: 11.4 10*3/uL — AB (ref 4.0–10.5)

## 2014-05-19 NOTE — Addendum Note (Signed)
Addended by: Linton Rump on: 05/19/2014 09:32 AM   Modules accepted: Orders

## 2014-05-19 NOTE — Progress Notes (Signed)
High Risk Pregnancy Diagnosis(es):  Hypothyroidism hx hodgkins lymphoma  G3P1011 [redacted]w[redacted]d Estimated Date of Delivery: 08/06/14  Blood pressure 116/72, weight 187 lb (84.823 kg), last menstrual period 06/09/2013, not currently breastfeeding.  Urinalysis: moderate 2+ Leukocytes otherwise negative  HPI: She states that she breastfeeds. She states that she had a C-section last time and will have one again. She denies any other symptoms. She states that her husband will be getting a vasectomy for their contraceptive methods. She states that she has done three IVFs and this current baby is a "surprise" baby. She was informed that she couldn't naturally have kids. G3P1011  BP weight and urine results all reviewed and noted. Patient reports good fetal movement, denies any bleeding and no rupture of membranes symptoms or regular contractions.  Fundal Height:  29 cm Fetal Heart rate:  144 Edema:  negative  Patient is without complaints. All questions were answered.  Assessment:  [redacted]w[redacted]d,   Repeat cesarean Medication(s) Plans:  No change Treatment Plan:   arepeat c/s 22 Jan Friday. Will check schedule  Follow up in 2 weeks for OB appt,   This chart was scribed for Jonnie Kind, MD by Steva Colder, ED Scribe. The patient was seen in room 1 at 10:21 AM.

## 2014-05-20 LAB — GLUCOSE TOLERANCE, 2 HOURS W/ 1HR
GLUCOSE, 2 HOUR: 91 mg/dL (ref 70–139)
GLUCOSE, FASTING: 77 mg/dL (ref 70–99)
Glucose, 1 hour: 148 mg/dL (ref 70–170)

## 2014-05-20 LAB — HSV 2 ANTIBODY, IGG: HSV 2 Glycoprotein G Ab, IgG: 0.11 IV

## 2014-05-20 LAB — RPR

## 2014-05-20 LAB — ANTIBODY SCREEN: ANTIBODY SCREEN: NEGATIVE

## 2014-05-20 LAB — TSH: TSH: 1.394 u[IU]/mL (ref 0.350–4.500)

## 2014-05-20 LAB — HIV ANTIBODY (ROUTINE TESTING W REFLEX): HIV: NONREACTIVE

## 2014-05-31 ENCOUNTER — Encounter: Payer: Self-pay | Admitting: Women's Health

## 2014-05-31 ENCOUNTER — Ambulatory Visit (INDEPENDENT_AMBULATORY_CARE_PROVIDER_SITE_OTHER): Payer: PRIVATE HEALTH INSURANCE | Admitting: Women's Health

## 2014-05-31 VITALS — BP 120/64 | Wt 192.0 lb

## 2014-05-31 DIAGNOSIS — O26893 Other specified pregnancy related conditions, third trimester: Secondary | ICD-10-CM

## 2014-05-31 DIAGNOSIS — Z1389 Encounter for screening for other disorder: Secondary | ICD-10-CM

## 2014-05-31 DIAGNOSIS — Z331 Pregnant state, incidental: Secondary | ICD-10-CM

## 2014-05-31 DIAGNOSIS — N898 Other specified noninflammatory disorders of vagina: Secondary | ICD-10-CM

## 2014-05-31 DIAGNOSIS — E039 Hypothyroidism, unspecified: Secondary | ICD-10-CM

## 2014-05-31 DIAGNOSIS — O09893 Supervision of other high risk pregnancies, third trimester: Secondary | ICD-10-CM

## 2014-05-31 LAB — POCT WET PREP (WET MOUNT): Clue Cells Wet Prep Whiff POC: NEGATIVE

## 2014-05-31 LAB — POCT URINALYSIS DIPSTICK
GLUCOSE UA: NEGATIVE
KETONES UA: NEGATIVE
Leukocytes, UA: NEGATIVE
Nitrite, UA: NEGATIVE
Protein, UA: NEGATIVE
RBC UA: NEGATIVE

## 2014-05-31 NOTE — Progress Notes (Signed)
[employer] High Risk Pregnancy Diagnosis(es): Hypothyroidism, h/o hodgkin's lymphoma G3P1011 [redacted]w[redacted]d Estimated Date of Delivery: 08/06/14 BP 120/64 mmHg  Wt 192 lb (87.091 kg)  LMP 06/09/2013  Urinalysis: Negative HPI:  Milk-white d/c today, no itching/odor/irritation. Constipation- takes miralax prn.  BP, weight, and urine reviewed.  Reports good fm. Denies regular uc's, lof, vb, uti s/s.   Fundal Height:  32 Fetal Heart rate:  146 Edema:  trace SSE: cx visually closed, small amount creamy white nonodorous d/c Wet prep: neg  Reviewed pn2 results, ptl s/s, fkc. Gave printed info on constipation- to begin colace daily. Recommended Tdap at HD/PCP per CDC guidelines.  All questions were answered Assessment: [redacted]w[redacted]d hypothyroidism Medication(s) Plans:  Continue current dosage of synthroid, continue asa for h/o pre-e Treatment Plan:  Continue current care Follow up in 2wks for high-risk OB appt, cancel tomorrow's appt

## 2014-05-31 NOTE — Patient Instructions (Signed)
Call the office [phone]) or go to [facility] if:  You begin to have strong, frequent contractions  Your water breaks.  Sometimes it is a big gush of fluid, sometimes it is just a trickle that keeps getting your panties wet or running down your legs  You have vaginal bleeding.  It is normal to have a small amount of spotting if your cervix was checked.   You don't feel your baby moving like normal.  If you don't, get you something to eat and drink and lay down and focus on feeling your baby move.  You should feel at least 10 movements in 2 hours.  If you don't, you should call the office or go to [facility].    Tdap Vaccine  It is recommended that you get the Tdap vaccine during the third trimester of EACH pregnancy to help protect your baby from getting pertussis (whooping cough)  27-36 weeks is the BEST time to do this so that you can pass the protection on to your baby. During pregnancy is better than after pregnancy, but if you are unable to get it during pregnancy it will be offered at the hospital.   You can get this vaccine at the health department or your family doctor  Everyone who will be around your baby should also be up-to-date on their vaccines. Adults (who are not pregnant) only need 1 dose of Tdap during adulthood.   Constipation  Drink plenty of fluid, preferably water, throughout the day  Eat foods high in fiber such as fruits, vegetables, and grains  Exercise, such as walking, is a good way to keep your bowels regular  Drink warm fluids, especially warm prune juice, or decaf coffee  Eat a 1/2 cup of real oatmeal (not instant), 1/2 cup applesauce, and 1/2-1 cup warm prune juice every day  If needed, you may take Colace (docusate sodium) stool softener once or twice a day to help keep the stool soft. If you are pregnant, wait until you are out of your first trimester (12-14 weeks of pregnancy)  If you still are having problems with constipation, you may  take Miralax once daily as needed to help keep your bowels regular.  If you are pregnant, wait until you are out of your first trimester (12-14 weeks of pregnancy)      Third Trimester of Pregnancy The third trimester is from week 29 through week 42, months 7 through 9. The third trimester is a time when the fetus is growing rapidly. At the end of the ninth month, the fetus is about 20 inches in length and weighs 6-10 pounds.  BODY CHANGES Your body goes through many changes during pregnancy. The changes vary from woman to woman.  15. Your weight will continue to increase. You can expect to gain 25-35 pounds (11-16 kg) by the end of the pregnancy. 16. You may begin to get stretch marks on your hips, abdomen, and breasts. 61. You may urinate more often because the fetus is moving lower into your pelvis and pressing on your bladder. 18. You may develop or continue to have heartburn as a result of your pregnancy. 19. You may develop constipation because certain hormones are causing the muscles that push waste through your intestines to slow down. 20. You may develop hemorrhoids or swollen, bulging veins (varicose veins). 21. You may have pelvic pain because of the weight gain and pregnancy hormones relaxing your joints between the bones in your pelvis. Backaches may result from  overexertion of the muscles supporting your posture. 22. You may have changes in your hair. These can include thickening of your hair, rapid growth, and changes in texture. Some women also have hair loss during or after pregnancy, or hair that feels dry or thin. Your hair will most likely return to normal after your baby is born. 23. Your breasts will continue to grow and be tender. A yellow discharge may leak from your breasts called colostrum. 24. Your belly button may stick out. 25. You may feel short of breath because of your expanding uterus. 21. You may notice the fetus "dropping," or moving lower in your  abdomen. 27. You may have a bloody mucus discharge. This usually occurs a few days to a week before labor begins. 28. Your cervix becomes thin and soft (effaced) near your due date. WHAT TO EXPECT AT YOUR PRENATAL EXAMS  You will have prenatal exams every 2 weeks until week 36. Then, you will have weekly prenatal exams. During a routine prenatal visit:  You will be weighed to make sure you and the fetus are growing normally.  Your blood pressure is taken.  Your abdomen will be measured to track your baby's growth.  The fetal heartbeat will be listened to.  Any test results from the previous visit will be discussed.  You may have a cervical check near your due date to see if you have effaced. At around 36 weeks, your caregiver will check your cervix. At the same time, your caregiver will also perform a test on the secretions of the vaginal tissue. This test is to determine if a type of bacteria, Group B streptococcus, is present. Your caregiver will explain this further. Your caregiver may ask you:  What your birth plan is.  How you are feeling.  If you are feeling the baby move.  If you have had any abnormal symptoms, such as leaking fluid, bleeding, severe headaches, or abdominal cramping.  If you have any questions. Other tests or screenings that may be performed during your third trimester include:  Blood tests that check for low iron levels (anemia).  Fetal testing to check the health, activity level, and growth of the fetus. Testing is done if you have certain medical conditions or if there are problems during the pregnancy. FALSE LABOR You may feel small, irregular contractions that eventually go away. These are called Braxton Hicks contractions, or false labor. Contractions may last for hours, days, or even weeks before true labor sets in. If contractions come at regular intervals, intensify, or become painful, it is best to be seen by your caregiver.  SIGNS OF LABOR    Menstrual-like cramps.  Contractions that are 5 minutes apart or less.  Contractions that start on the top of the uterus and spread down to the lower abdomen and back.  A sense of increased pelvic pressure or back pain.  A watery or bloody mucus discharge that comes from the vagina. If you have any of these signs before the 37th week of pregnancy, call your caregiver right away. You need to go to the hospital to get checked immediately. HOME CARE INSTRUCTIONS   Avoid all smoking, herbs, alcohol, and unprescribed drugs. These chemicals affect the formation and growth of the baby.  Follow your caregiver's instructions regarding medicine use. There are medicines that are either safe or unsafe to take during pregnancy.  Exercise only as directed by your caregiver. Experiencing uterine cramps is a good sign to stop exercising.  Continue to  eat regular, healthy meals.  Wear a good support bra for breast tenderness.  Do not use hot tubs, steam rooms, or saunas.  Wear your seat belt at all times when driving.  Avoid raw meat, uncooked cheese, cat litter boxes, and soil used by cats. These carry germs that can cause birth defects in the baby.  Take your prenatal vitamins.  Try taking a stool softener (if your caregiver approves) if you develop constipation. Eat more high-fiber foods, such as fresh vegetables or fruit and whole grains. Drink plenty of fluids to keep your urine clear or pale yellow.  Take warm sitz baths to soothe any pain or discomfort caused by hemorrhoids. Use hemorrhoid cream if your caregiver approves.  If you develop varicose veins, wear support hose. Elevate your feet for 15 minutes, 3-4 times a day. Limit salt in your diet.  Avoid heavy lifting, wear low heal shoes, and practice good posture.  Rest a lot with your legs elevated if you have leg cramps or low back pain.  Visit your dentist if you have not gone during your pregnancy. Use a soft toothbrush to  brush your teeth and be gentle when you floss.  A sexual relationship may be continued unless your caregiver directs you otherwise.  Do not travel far distances unless it is absolutely necessary and only with the approval of your caregiver.  Take prenatal classes to understand, practice, and ask questions about the labor and delivery.  Make a trial run to the hospital.  Pack your hospital bag.  Prepare the baby's nursery.  Continue to go to all your prenatal visits as directed by your caregiver. SEEK MEDICAL CARE IF:  You are unsure if you are in labor or if your water has broken.  You have dizziness.  You have mild pelvic cramps, pelvic pressure, or nagging pain in your abdominal area.  You have persistent nausea, vomiting, or diarrhea.  You have a bad smelling vaginal discharge.  You have pain with urination. SEEK IMMEDIATE MEDICAL CARE IF:   You have a fever.  You are leaking fluid from your vagina.  You have spotting or bleeding from your vagina.  You have severe abdominal cramping or pain.  You have rapid weight loss or gain.  You have shortness of breath with chest pain.  You notice sudden or extreme swelling of your face, hands, ankles, feet, or legs.  You have not felt your baby move in over an hour.  You have severe headaches that do not go away with medicine.  You have vision changes. Document Released: 06/19/2001 Document Revised: 06/30/2013 Document Reviewed: 08/26/2012 [facility] Patient Information 2015 [place], [place]. This information is not intended to replace advice given to you by your health care provider. Make sure you discuss any questions you have with your health care provider.

## 2014-06-01 ENCOUNTER — Encounter: Payer: PRIVATE HEALTH INSURANCE | Admitting: Women's Health

## 2014-06-02 ENCOUNTER — Encounter: Payer: PRIVATE HEALTH INSURANCE | Admitting: Women's Health

## 2014-06-14 ENCOUNTER — Ambulatory Visit (INDEPENDENT_AMBULATORY_CARE_PROVIDER_SITE_OTHER): Payer: PRIVATE HEALTH INSURANCE | Admitting: Women's Health

## 2014-06-14 ENCOUNTER — Encounter: Payer: Self-pay | Admitting: Women's Health

## 2014-06-14 VITALS — BP 118/64 | Wt 193.8 lb

## 2014-06-14 DIAGNOSIS — Z1389 Encounter for screening for other disorder: Secondary | ICD-10-CM

## 2014-06-14 DIAGNOSIS — O99280 Endocrine, nutritional and metabolic diseases complicating pregnancy, unspecified trimester: Secondary | ICD-10-CM

## 2014-06-14 DIAGNOSIS — O09893 Supervision of other high risk pregnancies, third trimester: Secondary | ICD-10-CM

## 2014-06-14 DIAGNOSIS — E039 Hypothyroidism, unspecified: Secondary | ICD-10-CM

## 2014-06-14 DIAGNOSIS — Z331 Pregnant state, incidental: Secondary | ICD-10-CM

## 2014-06-14 LAB — POCT URINALYSIS DIPSTICK
Glucose, UA: NEGATIVE
Ketones, UA: NEGATIVE
Leukocytes, UA: NEGATIVE
Nitrite, UA: NEGATIVE
RBC UA: NEGATIVE

## 2014-06-14 NOTE — Progress Notes (Signed)
High Risk Pregnancy Diagnosis(es): hypothyroidism, h/o hodgkin's lymphoma G3P1011 [redacted]w[redacted]d Estimated Date of Delivery: 08/06/14 BP 118/64 mmHg  Wt 193 lb 12 oz (87.884 kg)  LMP 06/09/2013  Urinalysis: Positive for trace protein HPI:  Doing well BP, weight, and urine reviewed.  Reports good fm. Denies regular uc's, lof, vb, uti s/s. No complaints.  Fundal Height:  32 Fetal Heart rate:  139 Edema:  trace  Reviewed ptl s/s, fkc All questions were answered Assessment: [redacted]w[redacted]d hypothyroidism, h/o hodgink's lymphoma Medication(s) Plans:  Continue sythroid and baby asa for h/o pre-e Treatment Plan:  Continue current care Follow up in 2wks for high-risk OB appt

## 2014-06-14 NOTE — Patient Instructions (Signed)
Call the office ([phone]) or go to [facility] if:  You begin to have strong, frequent contractions  Your water breaks.  Sometimes it is a big gush of fluid, sometimes it is just a trickle that keeps getting your panties wet or running down your legs  You have vaginal bleeding.  It is normal to have a small amount of spotting if your cervix was checked.   You don't feel your baby moving like normal.  If you don't, get you something to eat and drink and lay down and focus on feeling your baby move.  You should feel at least 10 movements in 2 hours.  If you don't, you should call the office or go to [facility].    Preterm Labor Information Preterm labor is when labor starts at less than 37 weeks of pregnancy. The normal length of a pregnancy is 39 to 41 weeks. CAUSES Often, there is no identifiable underlying cause as to why a woman goes into preterm labor. One of the most common known causes of preterm labor is infection. Infections of the uterus, cervix, vagina, amniotic sac, bladder, kidney, or even the lungs (pneumonia) can cause labor to start. Other suspected causes of preterm labor include:   Urogenital infections, such as yeast infections and bacterial vaginosis.   Uterine abnormalities (uterine shape, uterine septum, fibroids, or bleeding from the placenta).   A cervix that has been operated on (it may fail to stay closed).   Malformations in the fetus.   Multiple gestations (twins, triplets, and so on).   Breakage of the amniotic sac.  RISK FACTORS  Having a previous history of preterm labor.   Having premature rupture of membranes (PROM).   Having a placenta that covers the opening of the cervix (placenta previa).   Having a placenta that separates from the uterus (placental abruption).   Having a cervix that is too weak to hold the fetus in the uterus (incompetent cervix).   Having too much fluid in the amniotic sac (polyhydramnios).   Taking  illegal drugs or smoking while pregnant.   Not gaining enough weight while pregnant.   Being younger than 18 and older than 33 years old.   Having a low socioeconomic status.   Being African American. SYMPTOMS Signs and symptoms of preterm labor include:   Menstrual-like cramps, abdominal pain, or back pain.  Uterine contractions that are regular, as frequent as six in an hour, regardless of their intensity (may be mild or painful).  Contractions that start on the top of the uterus and spread down to the lower abdomen and back.   A sense of increased pelvic pressure.   A watery or bloody mucus discharge that comes from the vagina.  TREATMENT Depending on the length of the pregnancy and other circumstances, your health care provider may suggest bed rest. If necessary, there are medicines that can be given to stop contractions and to mature the fetal lungs. If labor happens before 34 weeks of pregnancy, a prolonged hospital stay may be recommended. Treatment depends on the condition of both you and the fetus.  WHAT SHOULD YOU DO IF YOU THINK YOU ARE IN PRETERM LABOR? Call your health care provider right away. You will need to go to the hospital to get checked immediately. HOW CAN YOU PREVENT PRETERM LABOR IN FUTURE PREGNANCIES? You should:   Stop smoking if you smoke.  Maintain healthy weight gain and avoid chemicals and drugs that are not necessary.  Be watchful for   any type of infection.  Inform your health care provider if you have a known history of preterm labor. Document Released: 09/15/2003 Document Revised: 02/25/2013 Document Reviewed: 07/28/2012 [employer] Patient Information 2015 [employer]. This information is not intended to replace advice given to you by your health care provider. Make sure you discuss any questions you have with your health care provider.  

## 2014-06-30 ENCOUNTER — Encounter: Payer: Self-pay | Admitting: Women's Health

## 2014-06-30 ENCOUNTER — Ambulatory Visit (INDEPENDENT_AMBULATORY_CARE_PROVIDER_SITE_OTHER): Payer: PRIVATE HEALTH INSURANCE | Admitting: Women's Health

## 2014-06-30 VITALS — BP 110/62 | Wt 199.0 lb

## 2014-06-30 DIAGNOSIS — Z331 Pregnant state, incidental: Secondary | ICD-10-CM

## 2014-06-30 DIAGNOSIS — O09893 Supervision of other high risk pregnancies, third trimester: Secondary | ICD-10-CM

## 2014-06-30 DIAGNOSIS — O99280 Endocrine, nutritional and metabolic diseases complicating pregnancy, unspecified trimester: Secondary | ICD-10-CM

## 2014-06-30 DIAGNOSIS — E039 Hypothyroidism, unspecified: Secondary | ICD-10-CM

## 2014-06-30 DIAGNOSIS — Z1389 Encounter for screening for other disorder: Secondary | ICD-10-CM

## 2014-06-30 LAB — POCT URINALYSIS DIPSTICK
GLUCOSE UA: NEGATIVE
KETONES UA: NEGATIVE
Leukocytes, UA: NEGATIVE
Nitrite, UA: NEGATIVE
RBC UA: NEGATIVE

## 2014-06-30 NOTE — Progress Notes (Signed)
High Risk Pregnancy Diagnosis(es): hyothyroidism, h/o hodgkin's lymphoma G3P1011 [redacted]w[redacted]d Estimated Date of Delivery: 08/06/14 BP 110/62 mmHg  Wt 199 lb (90.266 kg)  LMP 06/09/2013  Urinalysis: Positive for trace protein HPI:  Doing well BP, weight, and urine reviewed.  Reports good fm. Denies regular uc's, lof, vb, uti s/s. No complaints.  Fundal Height:  36 Fetal Heart rate:  135 Edema:  trace  Reviewed ptl s/s, fkc All questions were answered Assessment: [redacted]w[redacted]d hypothyroidism, h/o hodgkin's lymphoma Medication(s) Plans:  Continue synthroid for hypothyroidism and daily baby asa for h/o pre-e Treatment Plan:  Continue current care Follow up in 2wks for high-risk OB appt and gbs

## 2014-06-30 NOTE — Patient Instructions (Signed)
Call the office ([phone]) or go to [facility] if:  You begin to have strong, frequent contractions  Your water breaks.  Sometimes it is a big gush of fluid, sometimes it is just a trickle that keeps getting your panties wet or running down your legs  You have vaginal bleeding.  It is normal to have a small amount of spotting if your cervix was checked.   You don't feel your baby moving like normal.  If you don't, get you something to eat and drink and lay down and focus on feeling your baby move.  You should feel at least 10 movements in 2 hours.  If you don't, you should call the office or go to [facility].    Preterm Labor Information Preterm labor is when labor starts at less than 37 weeks of pregnancy. The normal length of a pregnancy is 39 to 41 weeks. CAUSES Often, there is no identifiable underlying cause as to why a woman goes into preterm labor. One of the most common known causes of preterm labor is infection. Infections of the uterus, cervix, vagina, amniotic sac, bladder, kidney, or even the lungs (pneumonia) can cause labor to start. Other suspected causes of preterm labor include:   Urogenital infections, such as yeast infections and bacterial vaginosis.   Uterine abnormalities (uterine shape, uterine septum, fibroids, or bleeding from the placenta).   A cervix that has been operated on (it may fail to stay closed).   Malformations in the fetus.   Multiple gestations (twins, triplets, and so on).   Breakage of the amniotic sac.  RISK FACTORS  Having a previous history of preterm labor.   Having premature rupture of membranes (PROM).   Having a placenta that covers the opening of the cervix (placenta previa).   Having a placenta that separates from the uterus (placental abruption).   Having a cervix that is too weak to hold the fetus in the uterus (incompetent cervix).   Having too much fluid in the amniotic sac (polyhydramnios).   Taking  illegal drugs or smoking while pregnant.   Not gaining enough weight while pregnant.   Being younger than 18 and older than 33 years old.   Having a low socioeconomic status.   Being African American. SYMPTOMS Signs and symptoms of preterm labor include:   Menstrual-like cramps, abdominal pain, or back pain.  Uterine contractions that are regular, as frequent as six in an hour, regardless of their intensity (may be mild or painful).  Contractions that start on the top of the uterus and spread down to the lower abdomen and back.   A sense of increased pelvic pressure.   A watery or bloody mucus discharge that comes from the vagina.  TREATMENT Depending on the length of the pregnancy and other circumstances, your health care provider may suggest bed rest. If necessary, there are medicines that can be given to stop contractions and to mature the fetal lungs. If labor happens before 34 weeks of pregnancy, a prolonged hospital stay may be recommended. Treatment depends on the condition of both you and the fetus.  WHAT SHOULD YOU DO IF YOU THINK YOU ARE IN PRETERM LABOR? Call your health care provider right away. You will need to go to the hospital to get checked immediately. HOW CAN YOU PREVENT PRETERM LABOR IN FUTURE PREGNANCIES? You should:   Stop smoking if you smoke.  Maintain healthy weight gain and avoid chemicals and drugs that are not necessary.  Be watchful for   any type of infection.  Inform your health care provider if you have a known history of preterm labor. Document Released: 09/15/2003 Document Revised: 02/25/2013 Document Reviewed: 07/28/2012 [facility] Patient Information 2015 [place], [place]. This information is not intended to replace advice given to you by your health care provider. Make sure you discuss any questions you have with your health care provider.

## 2014-07-13 ENCOUNTER — Encounter: Payer: Self-pay | Admitting: Women's Health

## 2014-07-13 ENCOUNTER — Ambulatory Visit (INDEPENDENT_AMBULATORY_CARE_PROVIDER_SITE_OTHER): Payer: PRIVATE HEALTH INSURANCE | Admitting: Women's Health

## 2014-07-13 VITALS — BP 118/80 | Wt 199.0 lb

## 2014-07-13 DIAGNOSIS — O99280 Endocrine, nutritional and metabolic diseases complicating pregnancy, unspecified trimester: Secondary | ICD-10-CM

## 2014-07-13 DIAGNOSIS — Z1389 Encounter for screening for other disorder: Secondary | ICD-10-CM

## 2014-07-13 DIAGNOSIS — Z118 Encounter for screening for other infectious and parasitic diseases: Secondary | ICD-10-CM

## 2014-07-13 DIAGNOSIS — E039 Hypothyroidism, unspecified: Secondary | ICD-10-CM

## 2014-07-13 DIAGNOSIS — Z3685 Encounter for antenatal screening for Streptococcus B: Secondary | ICD-10-CM

## 2014-07-13 DIAGNOSIS — Z331 Pregnant state, incidental: Secondary | ICD-10-CM

## 2014-07-13 DIAGNOSIS — O09893 Supervision of other high risk pregnancies, third trimester: Secondary | ICD-10-CM

## 2014-07-13 DIAGNOSIS — Z1159 Encounter for screening for other viral diseases: Secondary | ICD-10-CM

## 2014-07-13 LAB — POCT URINALYSIS DIPSTICK
Blood, UA: NEGATIVE
Glucose, UA: NEGATIVE
Ketones, UA: NEGATIVE
Leukocytes, UA: NEGATIVE
Nitrite, UA: NEGATIVE
PROTEIN UA: NEGATIVE

## 2014-07-13 NOTE — Progress Notes (Signed)
High Risk Pregnancy Diagnosis(es): hypothyroidism G3P1011 [redacted]w[redacted]d Estimated Date of Delivery: 08/06/14 BP 118/80 mmHg  Wt 199 lb (90.266 kg)  LMP 06/09/2013  Urinalysis: Negative HPI:  Doing well, had ha over weekend, gone now BP, weight, and urine reviewed.  Reports good fm. Denies regular uc's, lof, vb, uti s/s. No complaints.  Fundal Height:  36 Fetal Heart rate:  140 Edema:  Trace GBS collected SVE per request: 2/40/-2, vtx  Reviewed ptl s/s, fkc All questions were answered Assessment: [redacted]w[redacted]d hypothyroidism Medication(s) Plans:   Continue synthroid, and baby asa for h/o pre-e Treatment Plan:  Weekly visits Follow up in 1wk for high-risk OB appt and to discuss w/ MD when to come off of baby ASA prior to scheduled c/s

## 2014-07-13 NOTE — Patient Instructions (Signed)
Call the office ([phone]) or go to [facility] if:  You begin to have strong, frequent contractions  Your water breaks.  Sometimes it is a big gush of fluid, sometimes it is just a trickle that keeps getting your panties wet or running down your legs  You have vaginal bleeding.  It is normal to have a small amount of spotting if your cervix was checked.   You don't feel your baby moving like normal.  If you don't, get you something to eat and drink and lay down and focus on feeling your baby move.  You should feel at least 10 movements in 2 hours.  If you don't, you should call the office or go to [facility].    Preterm Labor Information Preterm labor is when labor starts at less than 37 weeks of pregnancy. The normal length of a pregnancy is 39 to 41 weeks. CAUSES Often, there is no identifiable underlying cause as to why a woman goes into preterm labor. One of the most common known causes of preterm labor is infection. Infections of the uterus, cervix, vagina, amniotic sac, bladder, kidney, or even the lungs (pneumonia) can cause labor to start. Other suspected causes of preterm labor include:   Urogenital infections, such as yeast infections and bacterial vaginosis.   Uterine abnormalities (uterine shape, uterine septum, fibroids, or bleeding from the placenta).   A cervix that has been operated on (it may fail to stay closed).   Malformations in the fetus.   Multiple gestations (twins, triplets, and so on).   Breakage of the amniotic sac.  RISK FACTORS  Having a previous history of preterm labor.   Having premature rupture of membranes (PROM).   Having a placenta that covers the opening of the cervix (placenta previa).   Having a placenta that separates from the uterus (placental abruption).   Having a cervix that is too weak to hold the fetus in the uterus (incompetent cervix).   Having too much fluid in the amniotic sac (polyhydramnios).   Taking  illegal drugs or smoking while pregnant.   Not gaining enough weight while pregnant.   Being younger than 18 and older than 35 years old.   Having a low socioeconomic status.   Being African American. SYMPTOMS Signs and symptoms of preterm labor include:   Menstrual-like cramps, abdominal pain, or back pain.  Uterine contractions that are regular, as frequent as six in an hour, regardless of their intensity (may be mild or painful).  Contractions that start on the top of the uterus and spread down to the lower abdomen and back.   A sense of increased pelvic pressure.   A watery or bloody mucus discharge that comes from the vagina.  TREATMENT Depending on the length of the pregnancy and other circumstances, your health care provider may suggest bed rest. If necessary, there are medicines that can be given to stop contractions and to mature the fetal lungs. If labor happens before 34 weeks of pregnancy, a prolonged hospital stay may be recommended. Treatment depends on the condition of both you and the fetus.  WHAT SHOULD YOU DO IF YOU THINK YOU ARE IN PRETERM LABOR? Call your health care provider right away. You will need to go to the hospital to get checked immediately. HOW CAN YOU PREVENT PRETERM LABOR IN FUTURE PREGNANCIES? You should:   Stop smoking if you smoke.  Maintain healthy weight gain and avoid chemicals and drugs that are not necessary.  Be watchful for   any type of infection.  Inform your health care provider if you have a known history of preterm labor. Document Released: 09/15/2003 Document Revised: 02/25/2013 Document Reviewed: 07/28/2012 [facility] Patient Information 2015 [place], [place]. This information is not intended to replace advice given to you by your health care provider. Make sure you discuss any questions you have with your health care provider.

## 2014-07-14 ENCOUNTER — Encounter: Payer: PRIVATE HEALTH INSURANCE | Admitting: Women's Health

## 2014-07-14 LAB — GC/CHLAMYDIA PROBE AMP
CT Probe RNA: NEGATIVE
GC PROBE AMP APTIMA: NEGATIVE

## 2014-07-15 LAB — CULTURE, STREPTOCOCCUS GRP B W/SUSCEPT

## 2014-07-20 ENCOUNTER — Ambulatory Visit (INDEPENDENT_AMBULATORY_CARE_PROVIDER_SITE_OTHER): Payer: PRIVATE HEALTH INSURANCE | Admitting: Obstetrics and Gynecology

## 2014-07-20 ENCOUNTER — Encounter: Payer: Self-pay | Admitting: Obstetrics and Gynecology

## 2014-07-20 DIAGNOSIS — Z331 Pregnant state, incidental: Secondary | ICD-10-CM

## 2014-07-20 DIAGNOSIS — Z3483 Encounter for supervision of other normal pregnancy, third trimester: Secondary | ICD-10-CM

## 2014-07-20 DIAGNOSIS — Z1389 Encounter for screening for other disorder: Secondary | ICD-10-CM

## 2014-07-20 LAB — POCT URINALYSIS DIPSTICK
Blood, UA: NEGATIVE
GLUCOSE UA: NEGATIVE
Ketones, UA: NEGATIVE
LEUKOCYTES UA: NEGATIVE
NITRITE UA: NEGATIVE
PROTEIN UA: NEGATIVE

## 2014-07-20 NOTE — Progress Notes (Signed)
Pt denies any problems or concerns at this time.  

## 2014-07-20 NOTE — Progress Notes (Signed)
[id number] [redacted]w[redacted]d Estimated Date of Delivery: 08/06/14  Blood pressure 120/80, weight 201 lb (91.173 kg), last menstrual period 06/09/2013, not currently breastfeeding.  Pt summarizes desire for repeat csection. Husband to get VAS. Pt not inclined to get BTL refer to the ob flow sheet for FH and FHR, also BP, Wt, Urine results:notable for negative  Patient reports   good fetal movement, denies any bleeding and no rupture of membranes symptoms or regular contractions. Patient complaints:none,  Questions were answered. Assessment: preg, 37 w 4 d , prior c/s for repeat at 39 wk . P preop labs 8 days.    Final ob visit 1 wk  Repeat c/s on Friday 07/30/14 at 1;30 PM Plan:  Continued routine obstetrical care,   F/u in  1 weeks for

## 2014-07-27 ENCOUNTER — Encounter: Payer: Self-pay | Admitting: Obstetrics and Gynecology

## 2014-07-27 ENCOUNTER — Ambulatory Visit (INDEPENDENT_AMBULATORY_CARE_PROVIDER_SITE_OTHER): Payer: PRIVATE HEALTH INSURANCE | Admitting: Obstetrics and Gynecology

## 2014-07-27 DIAGNOSIS — Z3493 Encounter for supervision of normal pregnancy, unspecified, third trimester: Secondary | ICD-10-CM

## 2014-07-27 DIAGNOSIS — Z1389 Encounter for screening for other disorder: Secondary | ICD-10-CM

## 2014-07-27 DIAGNOSIS — Z331 Pregnant state, incidental: Secondary | ICD-10-CM

## 2014-07-27 LAB — POCT URINALYSIS DIPSTICK
Blood, UA: NEGATIVE
Glucose, UA: NEGATIVE
KETONES UA: NEGATIVE
Leukocytes, UA: NEGATIVE
Nitrite, UA: NEGATIVE
Protein, UA: NEGATIVE

## 2014-07-27 NOTE — Progress Notes (Signed)
Pt denies any problems or concerns at this time.  

## 2014-07-28 ENCOUNTER — Other Ambulatory Visit: Payer: Self-pay | Admitting: Adult Health

## 2014-07-28 NOTE — Progress Notes (Signed)
[id number] [redacted]w[redacted]d Estimated Date of Delivery: 08/06/14  Blood pressure 120/78, weight 204 lb 8 oz (92.761 kg), last menstrual period 06/09/2013, not currently breastfeeding.   refer to the ob flow sheet for FH and FHR, also BP, Wt, Urine results:notable for negative   Patient reports   good fetal movement, denies any bleeding and no rupture of membranes symptoms or regular contractions. Patient complaints:none.  Questions were answered. Assessment:  Plan:  Continued routine obstetrical care, Repeat cesarean section at 1:30 on 1/22  F/u in 2 weeks for postop

## 2014-07-29 ENCOUNTER — Encounter (HOSPITAL_COMMUNITY)
Admission: RE | Admit: 2014-07-29 | Discharge: 2014-07-29 | Disposition: A | Discharge status: Home / Self Care | Payer: PRIVATE HEALTH INSURANCE | Source: Ambulatory Visit | Attending: Obstetrics and Gynecology | Admitting: Obstetrics and Gynecology

## 2014-07-29 ENCOUNTER — Encounter (HOSPITAL_COMMUNITY): Payer: Self-pay

## 2014-07-29 VITALS — BP 138/92 | HR 95 | Resp 18 | Ht 63.0 in | Wt 202.0 lb

## 2014-07-29 DIAGNOSIS — O34219 Maternal care for unspecified type scar from previous cesarean delivery: Secondary | ICD-10-CM

## 2014-07-29 HISTORY — DX: Personal history of other medical treatment: Z92.89

## 2014-07-29 LAB — CBC
HCT: 34 % — ABNORMAL LOW (ref 36.0–46.0)
HEMOGLOBIN: 10.8 g/dL — AB (ref 12.0–15.0)
MCH: 27.6 pg (ref 26.0–34.0)
MCHC: 31.8 g/dL (ref 30.0–36.0)
MCV: 86.7 fL (ref 78.0–100.0)
Platelets: 259 10*3/uL (ref 150–400)
RBC: 3.92 MIL/uL (ref 3.87–5.11)
RDW: 14.7 % (ref 11.5–15.5)
WBC: 10.5 10*3/uL (ref 4.0–10.5)

## 2014-07-29 NOTE — Patient Instructions (Addendum)
   Your procedure is scheduled on: Friday, Jan 22  Enter through the [employer] [facility] at: [place] up the phone at the desk and dial [phone] and inform us of your arrival.  Please call this number if you have any problems the morning of surgery: [phone]  Remember: Do not eat food after midnight: Tonight Do not drink clear liquids after: 9:30 AM Friday, day of surgery Take these medicines the morning of surgery with a SIP OF WATER: SYNTHROID  Do not wear jewelry, make-up, or FINGER nail polish No metal in your hair or on your body. Do not wear lotions, powders, perfumes.  You may wear deodorant.  Do not bring valuables to the hospital. Contacts, dentures or bridgework may not be worn into surgery.  Leave suitcase in the car. After Surgery it may be brought to your room. For patients being admitted to the hospital, checkout time is 11:00am the day of discharge.  Home with husband Sonny cell [phone]

## 2014-07-29 NOTE — Pre-Procedure Instructions (Signed)
SDS BB history Log given to Lab for patient's previous blood transfusion in 1995.

## 2014-07-30 ENCOUNTER — Inpatient Hospital Stay (HOSPITAL_COMMUNITY): Payer: PRIVATE HEALTH INSURANCE | Admitting: Anesthesiology

## 2014-07-30 ENCOUNTER — Encounter (HOSPITAL_COMMUNITY): Payer: Self-pay | Admitting: *Deleted

## 2014-07-30 ENCOUNTER — Encounter (HOSPITAL_COMMUNITY)
Admission: RE | Disposition: A | Discharge status: Home / Self Care | Payer: Self-pay | Source: Ambulatory Visit | Attending: Obstetrics and Gynecology

## 2014-07-30 ENCOUNTER — Inpatient Hospital Stay (HOSPITAL_COMMUNITY)
Admission: RE | Admit: 2014-07-30 | Discharge: 2014-08-01 | DRG: 766 | Disposition: A | Discharge status: Home / Self Care | Payer: PRIVATE HEALTH INSURANCE | Source: Ambulatory Visit | Attending: Obstetrics and Gynecology | Admitting: Obstetrics and Gynecology

## 2014-07-30 DIAGNOSIS — Z98891 History of uterine scar from previous surgery: Secondary | ICD-10-CM

## 2014-07-30 DIAGNOSIS — Z3A39 39 weeks gestation of pregnancy: Secondary | ICD-10-CM | POA: Diagnosis present

## 2014-07-30 DIAGNOSIS — Z880 Allergy status to penicillin: Secondary | ICD-10-CM

## 2014-07-30 DIAGNOSIS — O34219 Maternal care for unspecified type scar from previous cesarean delivery: Secondary | ICD-10-CM

## 2014-07-30 DIAGNOSIS — O09293 Supervision of pregnancy with other poor reproductive or obstetric history, third trimester: Secondary | ICD-10-CM

## 2014-07-30 DIAGNOSIS — O3421 Maternal care for scar from previous cesarean delivery: Secondary | ICD-10-CM | POA: Diagnosis not present

## 2014-07-30 DIAGNOSIS — Z8571 Personal history of Hodgkin lymphoma: Secondary | ICD-10-CM

## 2014-07-30 LAB — RPR: RPR Ser Ql: NONREACTIVE

## 2014-07-30 LAB — PREPARE RBC (CROSSMATCH)

## 2014-07-30 SURGERY — Surgical Case
Anesthesia: Spinal

## 2014-07-30 MED ORDER — ONDANSETRON HCL 4 MG/2ML IJ SOLN
INTRAMUSCULAR | Status: DC | PRN
  Administered 2014-07-30: 4 mg via INTRAVENOUS

## 2014-07-30 MED ORDER — SIMETHICONE 80 MG PO CHEW
80.0000 mg | CHEWABLE_TABLET | Freq: Three times a day (TID) | ORAL | Status: DC
  Administered 2014-07-31 (×2): 80 mg via ORAL
  Filled 2014-07-30 (×2): qty 1

## 2014-07-30 MED ORDER — NALBUPHINE HCL 10 MG/ML IJ SOLN: 5.0000 mg | Freq: Once | INTRAMUSCULAR | Status: AC | PRN

## 2014-07-30 MED ORDER — PHENYLEPHRINE 40 MCG/ML (10ML) SYRINGE FOR IV PUSH (FOR BLOOD PRESSURE SUPPORT)
PREFILLED_SYRINGE | INTRAVENOUS | Status: AC
  Filled 2014-07-30: qty 10

## 2014-07-30 MED ORDER — TETANUS-DIPHTH-ACELL PERTUSSIS 5-2.5-18.5 LF-MCG/0.5 IM SUSP: 0.5000 mL | Freq: Once | INTRAMUSCULAR | Status: DC

## 2014-07-30 MED ORDER — MENTHOL 3 MG MT LOZG: 1.0000 | LOZENGE | OROMUCOSAL | Status: DC | PRN

## 2014-07-30 MED ORDER — MEPERIDINE HCL 25 MG/ML IJ SOLN
6.2500 mg | INTRAMUSCULAR | Status: DC | PRN
Start: 2014-07-30 — End: 2014-07-30

## 2014-07-30 MED ORDER — ONDANSETRON HCL 4 MG PO TABS: 4.0000 mg | ORAL_TABLET | ORAL | Status: DC | PRN

## 2014-07-30 MED ORDER — DIBUCAINE 1 % RE OINT: 1.0000 "application " | TOPICAL_OINTMENT | RECTAL | Status: DC | PRN

## 2014-07-30 MED ORDER — SCOPOLAMINE 1 MG/3DAYS TD PT72
1.0000 | MEDICATED_PATCH | Freq: Once | TRANSDERMAL | Status: DC
  Administered 2014-07-30: 1.5 mg via TRANSDERMAL

## 2014-07-30 MED ORDER — PHENYLEPHRINE 8 MG IN D5W 100 ML (0.08MG/ML) PREMIX OPTIME
INJECTION | INTRAVENOUS | Status: AC
  Filled 2014-07-30: qty 100

## 2014-07-30 MED ORDER — LACTATED RINGERS IV SOLN
INTRAVENOUS | Status: DC
  Administered 2014-07-30: 22:00:00 via INTRAVENOUS

## 2014-07-30 MED ORDER — HYDROMORPHONE HCL 1 MG/ML IJ SOLN: 0.2500 mg | INTRAMUSCULAR | Status: DC | PRN

## 2014-07-30 MED ORDER — PRENATAL MULTIVITAMIN CH
1.0000 | ORAL_TABLET | Freq: Every day | ORAL | Status: DC
  Administered 2014-07-31 – 2014-08-01 (×2): 1 via ORAL
  Filled 2014-07-30 (×2): qty 1

## 2014-07-30 MED ORDER — SENNOSIDES-DOCUSATE SODIUM 8.6-50 MG PO TABS
2.0000 | ORAL_TABLET | ORAL | Status: DC
  Administered 2014-07-31 (×2): 2 via ORAL
  Filled 2014-07-30 (×2): qty 2

## 2014-07-30 MED ORDER — DIPHENHYDRAMINE HCL 25 MG PO CAPS: 25.0000 mg | ORAL_CAPSULE | Freq: Four times a day (QID) | ORAL | Status: DC | PRN

## 2014-07-30 MED ORDER — ZOLPIDEM TARTRATE 5 MG PO TABS: 5.0000 mg | ORAL_TABLET | Freq: Every evening | ORAL | Status: DC | PRN

## 2014-07-30 MED ORDER — LACTATED RINGERS IV SOLN
INTRAVENOUS | Status: DC | PRN
  Administered 2014-07-30: 12:00:00 via INTRAVENOUS

## 2014-07-30 MED ORDER — FENTANYL CITRATE 0.05 MG/ML IJ SOLN
INTRAMUSCULAR | Status: AC
  Filled 2014-07-30: qty 2

## 2014-07-30 MED ORDER — LACTATED RINGERS IV SOLN
INTRAVENOUS | Status: DC
  Administered 2014-07-30 (×2): via INTRAVENOUS

## 2014-07-30 MED ORDER — CEFAZOLIN SODIUM-DEXTROSE 2-3 GM-% IV SOLR
INTRAVENOUS | Status: AC
  Administered 2014-07-30: 2 g via INTRAVENOUS
  Filled 2014-07-30: qty 50

## 2014-07-30 MED ORDER — SIMETHICONE 80 MG PO CHEW
80.0000 mg | CHEWABLE_TABLET | ORAL | Status: DC | PRN
Start: 2014-07-30 — End: 2014-08-01

## 2014-07-30 MED ORDER — ONDANSETRON HCL 4 MG/2ML IJ SOLN
INTRAMUSCULAR | Status: AC
  Filled 2014-07-30: qty 2

## 2014-07-30 MED ORDER — IBUPROFEN 600 MG PO TABS
600.0000 mg | ORAL_TABLET | Freq: Four times a day (QID) | ORAL | Status: DC
  Administered 2014-07-30 – 2014-08-01 (×7): 600 mg via ORAL
  Filled 2014-07-30 (×6): qty 1

## 2014-07-30 MED ORDER — OXYCODONE-ACETAMINOPHEN 5-325 MG PO TABS: 2.0000 | ORAL_TABLET | ORAL | Status: DC | PRN

## 2014-07-30 MED ORDER — PHENYLEPHRINE 8 MG IN D5W 100 ML (0.08MG/ML) PREMIX OPTIME
INJECTION | INTRAVENOUS | Status: DC | PRN
  Administered 2014-07-30: 80 ug/min via INTRAVENOUS

## 2014-07-30 MED ORDER — PHENYLEPHRINE HCL 10 MG/ML IJ SOLN
INTRAMUSCULAR | Status: DC | PRN
  Administered 2014-07-30: 80 ug via INTRAVENOUS
  Administered 2014-07-30: 40 ug via INTRAVENOUS

## 2014-07-30 MED ORDER — FENTANYL CITRATE 0.05 MG/ML IJ SOLN
INTRAMUSCULAR | Status: DC | PRN
  Administered 2014-07-30: 25 ug via INTRATHECAL

## 2014-07-30 MED ORDER — LACTATED RINGERS IV SOLN
Freq: Once | INTRAVENOUS | Status: AC
  Administered 2014-07-30: 12:00:00 via INTRAVENOUS

## 2014-07-30 MED ORDER — LANOLIN HYDROUS EX OINT: 1.0000 "application " | TOPICAL_OINTMENT | CUTANEOUS | Status: DC | PRN

## 2014-07-30 MED ORDER — OXYCODONE-ACETAMINOPHEN 5-325 MG PO TABS: 1.0000 | ORAL_TABLET | ORAL | Status: DC | PRN

## 2014-07-30 MED ORDER — MORPHINE SULFATE 0.5 MG/ML IJ SOLN
INTRAMUSCULAR | Status: AC
  Filled 2014-07-30: qty 10

## 2014-07-30 MED ORDER — SIMETHICONE 80 MG PO CHEW
80.0000 mg | CHEWABLE_TABLET | ORAL | Status: DC
  Administered 2014-07-31 (×2): 80 mg via ORAL
  Filled 2014-07-30 (×2): qty 1

## 2014-07-30 MED ORDER — OXYTOCIN 10 UNIT/ML IJ SOLN
INTRAMUSCULAR | Status: AC
  Filled 2014-07-30: qty 4

## 2014-07-30 MED ORDER — ERYTHROMYCIN 5 MG/GM OP OINT
TOPICAL_OINTMENT | OPHTHALMIC | Status: AC
  Filled 2014-07-30: qty 1

## 2014-07-30 MED ORDER — SCOPOLAMINE 1 MG/3DAYS TD PT72
MEDICATED_PATCH | TRANSDERMAL | Status: AC
  Administered 2014-07-30: 1.5 mg via TRANSDERMAL
  Filled 2014-07-30: qty 1

## 2014-07-30 MED ORDER — MORPHINE SULFATE (PF) 0.5 MG/ML IJ SOLN
INTRAMUSCULAR | Status: DC | PRN
  Administered 2014-07-30: .1 mg via INTRATHECAL

## 2014-07-30 MED ORDER — OXYTOCIN 40 UNITS IN LACTATED RINGERS INFUSION - SIMPLE MED: 62.5000 mL/h | INTRAVENOUS | Status: AC

## 2014-07-30 MED ORDER — WITCH HAZEL-GLYCERIN EX PADS: 1.0000 "application " | MEDICATED_PAD | CUTANEOUS | Status: DC | PRN

## 2014-07-30 MED ORDER — CEFAZOLIN SODIUM-DEXTROSE 2-3 GM-% IV SOLR
2.0000 g | Freq: Once | INTRAVENOUS | Status: DC
Start: 2014-07-30 — End: 2014-08-01

## 2014-07-30 MED ORDER — ONDANSETRON HCL 4 MG/2ML IJ SOLN
4.0000 mg | INTRAMUSCULAR | Status: DC | PRN
Start: 2014-07-30 — End: 2014-08-01

## 2014-07-30 MED ORDER — OXYTOCIN 10 UNIT/ML IJ SOLN
40.0000 [IU] | INTRAVENOUS | Status: DC | PRN
  Administered 2014-07-30: 40 [IU] via INTRAVENOUS

## 2014-07-30 SURGICAL SUPPLY — 33 items
BENZOIN TINCTURE PRP APPL 2/3 (GAUZE/BANDAGES/DRESSINGS) ×3 IMPLANT
CLAMP CORD UMBIL (MISCELLANEOUS) IMPLANT
CLOSURE WOUND 1/2 X4 (GAUZE/BANDAGES/DRESSINGS) ×1
CLOTH BEACON ORANGE TIMEOUT ST (SAFETY) ×3 IMPLANT
DRAPE SHEET LG 3/4 BI-LAMINATE (DRAPES) IMPLANT
DRSG OPSITE POSTOP 4X10 (GAUZE/BANDAGES/DRESSINGS) ×3 IMPLANT
DRSG TELFA 3X8 NADH (GAUZE/BANDAGES/DRESSINGS) ×3 IMPLANT
DURAPREP 26ML APPLICATOR (WOUND CARE) ×3 IMPLANT
ELECT REM PT RETURN 9FT ADLT (ELECTROSURGICAL) ×3
ELECTRODE REM PT RTRN 9FT ADLT (ELECTROSURGICAL) ×1 IMPLANT
EXTRACTOR VACUUM KIWI (MISCELLANEOUS) IMPLANT
GLOVE BIO SURGEON ST LM GN SZ9 (GLOVE) ×3 IMPLANT
GLOVE BIOGEL PI IND STRL 9 (GLOVE) ×1 IMPLANT
GLOVE BIOGEL PI INDICATOR 9 (GLOVE) ×2
GOWN STRL REUS W/TWL 2XL LVL3 (GOWN DISPOSABLE) ×3 IMPLANT
GOWN STRL REUS W/TWL LRG LVL3 (GOWN DISPOSABLE) ×3 IMPLANT
NEEDLE HYPO 25X5/8 SAFETYGLIDE (NEEDLE) IMPLANT
NS IRRIG 1000ML POUR BTL (IV SOLUTION) ×3 IMPLANT
PACK C SECTION WH (CUSTOM PROCEDURE TRAY) ×3 IMPLANT
PAD OB MATERNITY 4.3X12.25 (PERSONAL CARE ITEMS) ×3 IMPLANT
RTRCTR C-SECT PINK 25CM LRG (MISCELLANEOUS) IMPLANT
RTRCTR C-SECT PINK 34CM XLRG (MISCELLANEOUS) IMPLANT
STRIP CLOSURE SKIN 1/2X4 (GAUZE/BANDAGES/DRESSINGS) ×2 IMPLANT
SUT MNCRL 0 VIOLET CTX 36 (SUTURE) ×1 IMPLANT
SUT MONOCRYL 0 CTX 36 (SUTURE) ×2
SUT VIC AB 0 CT1 27 (SUTURE) ×6
SUT VIC AB 0 CT1 27XBRD ANBCTR (SUTURE) ×3 IMPLANT
SUT VIC AB 2-0 CT1 27 (SUTURE) ×2
SUT VIC AB 2-0 CT1 TAPERPNT 27 (SUTURE) ×1 IMPLANT
SUT VIC AB 4-0 KS 27 (SUTURE) ×3 IMPLANT
SYR BULB IRRIGATION 50ML (SYRINGE) IMPLANT
TOWEL OR 17X24 6PK STRL BLUE (TOWEL DISPOSABLE) ×3 IMPLANT
TRAY FOLEY CATH 14FR (SET/KITS/TRAYS/PACK) ×3 IMPLANT

## 2014-07-30 NOTE — Brief Op Note (Signed)
07/30/2014  1:33 PM  PATIENT:  Bonnie Brown  34 y.o. female  PRE-OPERATIVE DIAGNOSIS:  REPEAT CESAREAN SECTION  Pregnancy 39 wk , prior cesarean not for TOLAC  POST-OPERATIVE DIAGNOSIS:  repeat cesarean section Same  PROCEDURE:  Procedure(s): REPEAT CESAREAN SECTION (N/A)  SURGEON:  Surgeon(s) and Role:    * Jonnie Kind, MD - Primary  PHYSICIAN ASSISTANT:   ASSISTANTS: none   ANESTHESIA:   spinal  EBL:  Total I/O In: 1800 [I.V.:1800] Out: 800 [Urine:300; Blood:500]  BLOOD ADMINISTERED:none  DRAINS: Urinary Catheter (Foley)   LOCAL MEDICATIONS USED:  NONE  SPECIMEN:  Source of Specimen:  placenta to L& D  DISPOSITION OF SPECIMEN:  L&D  COUNTS:  YES  TOURNIQUET:  * No tourniquets in log *  DICTATION: .Dragon Dictation  PLAN OF CARE: Admit to inpatient   PATIENT DISPOSITION:  PACU - hemodynamically stable.   Delay start of Pharmacological VTE agent (>24hrs) due to surgical blood loss or risk of bleeding: not applicable

## 2014-07-30 NOTE — Transfer of Care (Signed)
Immediate Anesthesia Transfer of Care Note  Patient: Bonnie Brown  Procedure(s) Performed: Procedure(s): REPEAT CESAREAN SECTION (N/A)  Patient Location: PACU  Anesthesia Type:Spinal  Level of Consciousness: awake, alert  and oriented  Airway & Oxygen Therapy: Patient Spontanous Breathing  Post-op Assessment: Report given to PACU RN and Post -op Vital signs reviewed and stable  Post vital signs: Reviewed and stable  Complications: No apparent anesthesia complications

## 2014-07-30 NOTE — Anesthesia Preprocedure Evaluation (Signed)
Anesthesia Evaluation  Patient identified by MRN, date of birth, ID band Patient awake    Reviewed: Allergy & Precautions, H&P , Patient's Chart, lab work & pertinent test results  Airway Mallampati: III  TM Distance: >3 FB Neck ROM: Full    Dental no notable dental hx. (+) Teeth Intact   Pulmonary neg pulmonary ROS,  breath sounds clear to auscultation  Pulmonary exam normal       Cardiovascular hypertension, Rhythm:Regular Rate:Normal  PIH   Neuro/Psych negative neurological ROS  negative psych ROS   GI/Hepatic negative GI ROS, Neg liver ROS,   Endo/Other  Hypothyroidism Obesity   Renal/GU negative Renal ROS  negative genitourinary   Musculoskeletal negative musculoskeletal ROS (+)   Abdominal (+) + obese,   Peds  Hematology Hx/o Hodgkin's Diseases S/P ChemoRx   Anesthesia Other Findings Hypothyroidism   Anemia     Elevated liver enzymes 11/24/2012  History of Hodgkin's disease 11/24/2012   Pregnant 12/07/2013  Missed ab 2012   [place] Hodgin's - in remission  Left sciatic nerve pain 04/26/2014 with 07/2014 pregnancy History of blood transfusion [facility]  Hx of PIH     Reproductive/Obstetrics (+) Pregnancy                             Anesthesia Physical  Anesthesia Plan  ASA: III and emergent  Anesthesia Plan: Spinal   Post-op Pain Management:    Induction:   Airway Management Planned: Natural Airway  Additional Equipment:   Intra-op Plan:   Post-operative Plan:   Informed Consent: I have reviewed the patients History and Physical, chart, labs and discussed the procedure including the risks, benefits and alternatives for the proposed anesthesia with the patient or authorized representative who has indicated his/her understanding and acceptance.   Dental advisory given  Plan Discussed with: Anesthesiologist, CRNA and Surgeon  Anesthesia Plan  Comments: (Patient for urgent C/Section for arrest of descent and severe preecclampsia.)        Anesthesia Quick Evaluation

## 2014-07-30 NOTE — H&P (Signed)
Bonnie Brown is a 34 y.o. female presenting for repeat cesarean section. She declined consideration of TOLAC. She does NOT desire sterilization, as husband plans vasectomy. Risks of procedure are reviewed, and patient reports she has only a rash in response to PCN. She will receive Ancef as surgical prophylaxis. Type and screen is in place. History OB History    Gravida Para Term Preterm AB TAB SAB Ectopic Multiple Living   3 1 1  1  1   1      Past Medical History  Diagnosis Date  . Hypothyroidism   . Anemia   . Elevated liver enzymes 11/24/2012  . History of Hodgkin's disease 11/24/2012  . Pregnant 12/07/2013  . Missed ab 2012  . Hypertension in pregnancy, postpartum condition 11/14/2012    RESOLVED WITH 2014 DELIVERY  . Cancer 1995    Hodgin's - in remission  . Left sciatic nerve pain 04/26/2014    with 07/2014 pregnancy  . History of blood transfusion [facility]    Past Surgical History  Procedure Laterality Date  . Lymph node biopsy  1995  . Cesarean section N/A 11/04/2012    Procedure: Primary cesarean section with delivery of baby boy at 2007. apgars 8/9.;  Surgeon: Osborne Oman, MD;  Location: [facility];  Service: Obstetrics;  Laterality: N/A;  . Wisdom tooth extraction     Family History: family history includes Cancer in her maternal grandmother; Coronary artery disease in her father, maternal grandmother, and paternal grandfather; Diabetes in her maternal grandmother; Hypertension in her brother and mother; Stroke in her maternal grandmother. Social History:  reports that she has never smoked. She has never used smokeless tobacco. She reports that she drinks alcohol. She reports that she does not use illicit drugs.   Prenatal Transfer Tool  Maternal Diabetes: No Genetic Screening: Normal Maternal Ultrasounds/Referrals: Normal Fetal Ultrasounds or other Referrals:  None Maternal Substance Abuse:  No Significant Maternal Medications:  Meds include: Other:  asprin til this month, synthroid, prenatal vits Significant Maternal Lab Results:  Lab values include: Group B Strep negative Other Comments:  None  ROS    Blood pressure 134/87, pulse 110, temperature 97.9 F (36.6 C), temperature source Oral, resp. rate 18, last menstrual period 06/09/2013, SpO2 97 %, not currently breastfeeding. Exam Physical Exam  Constitutional: She appears well-developed and well-nourished.  HENT:  Head: Normocephalic.  Neck: Normal range of motion. Neck supple.  Cardiovascular: Normal rate and regular rhythm.   Respiratory: Effort normal.  GI: Soft.  Gravid uterus consitent with dates  Musculoskeletal: Normal range of motion.  Neurological: She is alert.  Psychiatric: She has a normal mood and affect. Her behavior is normal. Judgment and thought content normal.    Prenatal labs: ABO, Rh: --/--/A POS (01/21 1045) Antibody: NEG (01/21 1045) Rubella: 1.54 (06/24 1600) RPR: Non Reactive (01/21 1045)  HBsAg: NEGATIVE (06/24 1600)  HIV: NONREACTIVE (11/11 0913)  GBS:     Assessment/Plan: Pregnancy 39 wks, prior cesarean not for tolac. Hx Hodgkins lymphoma as child. Plan Repeat cesarean section. Ancef IV preop.    Jassmin Kemmerer V 07/30/2014, 11:23 AM

## 2014-07-30 NOTE — Anesthesia Procedure Notes (Signed)
Spinal Patient location during procedure: OR Preanesthetic Checklist Completed: patient identified, site marked, surgical consent, pre-op evaluation, timeout performed, IV checked, risks and benefits discussed and monitors and equipment checked Spinal Block Patient position: sitting Prep: DuraPrep Patient monitoring: heart rate, cardiac monitor, continuous pulse ox and blood pressure Approach: midline Location: L3-4 Injection technique: single-shot Needle Needle type: Sprotte  Needle gauge: 24 G Needle length: 9 cm Assessment Sensory level: T4 Additional Notes Spinal Dosage in OR  Bupivicaine ml       1.3 PFMS04   mcg        100 Fentanyl mcg            25

## 2014-07-30 NOTE — Addendum Note (Signed)
Addendum  created 07/30/14 2010 by Hubbard Robinson, CRNA   Modules edited: Notes Section   Notes Section:  File: [id number]

## 2014-07-30 NOTE — Op Note (Signed)
07/30/2014  1:33 PM  PATIENT:  Bonnie Brown  34 y.o. female  PRE-OPERATIVE DIAGNOSIS:  REPEAT CESAREAN SECTION  Pregnancy 39 wk , prior cesarean not for TOLAC  POST-OPERATIVE DIAGNOSIS:  repeat cesarean section Same  PROCEDURE:  Procedure(s): REPEAT CESAREAN SECTION (N/A)  SURGEON:  Surgeon(s) and Role:    * Jonnie Kind, MD - Primary  PHYSICIAN ASSISTANT:   ASSISTANTS: none   ANESTHESIA:   spinal  EBL:  Total I/O In: 1800 [I.V.:1800] Out: 800 [Urine:300; Blood:500]  BLOOD ADMINISTERED:none  DRAINS: Urinary Catheter (Foley)   LOCAL MEDICATIONS USED:  NONE  SPECIMEN:  Source of Specimen:  placenta to L& D  DISPOSITION OF SPECIMEN:  L&D  COUNTS:  YES  TOURNIQUET:  * No tourniquets in log *  DICTATION: .Dragon Dictation  PLAN OF CARE: Admit to inpatient   PATIENT DISPOSITION:  PACU - hemodynamically stable.   Delay start of Pharmacological VTE agent (>24hrs) due to surgical blood loss or risk of bleeding: not applicable Details of procedure:. Patient was taken to the operating room prepped and draped for lower abdominal surgery with Foley catheter and abdomen prepped. Timeout was conducted. Ancef was administered. Transverse incision was made excising the old cicatrix and then opening the fascia transversely and opened the peritoneum in the midline. Bladder flap was developed. Transverse lower abdominal incision was made sharply down to the bag of waters, extended laterally using index finger traction membranes ruptured and fetal vertex guided through the incision and delivered with fundal pressure. traction on the axilla delivered the fetal body. Cord was clamped. The infant was passed to waiting pediatrician in good condition see pediatrician's notes for details. Summary delivered easily and response to could a uterine massage, and then the uterus was swabbed out of remaining membrane fragments in the lower uterine segment. Single-layer running locking closure  with 0 Monocryl was performed initially then oversewn with continuous running 0 Monocryl second layer. Abdomen was inspected confirmed as hemostatic, minimal fluid in the abdomen. Anterior peritoneum was closed using a running 2-0 Vicryl, 0 Vicryl closed the fascia and subcutaneous tissues were closed with 2-0 plain. Subcuticular 4-0 Vicryl close the skin incision with good surgical results EBL 500 cc

## 2014-07-30 NOTE — Anesthesia Postprocedure Evaluation (Addendum)
  Anesthesia Post-op Note  Patient: Bonnie Brown  Procedure(s) Performed: Procedure(s): REPEAT CESAREAN SECTION (N/A)  Patient is awake, responsive, moving her legs, and has signs of resolution of her numbness. Pain and nausea are reasonably well controlled. Vital signs are stable and clinically acceptable. Oxygen saturation is clinically acceptable. There are no apparent anesthetic complications at this time. Patient is ready for discharge.

## 2014-07-30 NOTE — Anesthesia Postprocedure Evaluation (Signed)
  Anesthesia Post-op Note  Patient: Bonnie Brown  Procedure(s) Performed: Procedure(s): REPEAT CESAREAN SECTION (N/A)  Patient Location: PACU and Mother/Baby  Anesthesia Type:Spinal  Level of Consciousness: awake, alert , oriented and patient cooperative  Airway and Oxygen Therapy: Patient Spontanous Breathing  Post-op Pain: none  Post-op Assessment: Post-op Vital signs reviewed, No signs of Nausea or vomiting, No headache, No backache, No residual numbness and No residual motor weakness  Post-op Vital Signs: Reviewed and stable  Last Vitals:  Filed Vitals:   07/30/14 1903  BP: 112/60  Pulse: 104  Temp:   Resp:     Complications: No apparent anesthesia complications

## 2014-07-31 LAB — CBC
HEMATOCRIT: 30.3 % — AB (ref 36.0–46.0)
HEMOGLOBIN: 9.7 g/dL — AB (ref 12.0–15.0)
MCH: 27.9 pg (ref 26.0–34.0)
MCHC: 32 g/dL (ref 30.0–36.0)
MCV: 87.1 fL (ref 78.0–100.0)
Platelets: 237 10*3/uL (ref 150–400)
RBC: 3.48 MIL/uL — ABNORMAL LOW (ref 3.87–5.11)
RDW: 15 % (ref 11.5–15.5)
WBC: 12.1 10*3/uL — AB (ref 4.0–10.5)

## 2014-07-31 MED ORDER — NALOXONE HCL 1 MG/ML IJ SOLN
1.0000 ug/kg/h | INTRAVENOUS | Status: DC | PRN
  Filled 2014-07-31: qty 2

## 2014-07-31 MED ORDER — DIPHENHYDRAMINE HCL 25 MG PO CAPS: 25.0000 mg | ORAL_CAPSULE | ORAL | Status: DC | PRN

## 2014-07-31 MED ORDER — DIPHENHYDRAMINE HCL 50 MG/ML IJ SOLN: 12.5000 mg | INTRAMUSCULAR | Status: DC | PRN

## 2014-07-31 MED ORDER — NALBUPHINE HCL 10 MG/ML IJ SOLN: 5.0000 mg | INTRAMUSCULAR | Status: DC | PRN

## 2014-07-31 MED ORDER — SODIUM CHLORIDE 0.9 % IJ SOLN: 3.0000 mL | INTRAMUSCULAR | Status: DC | PRN

## 2014-07-31 MED ORDER — ONDANSETRON HCL 4 MG/2ML IJ SOLN: 4.0000 mg | Freq: Three times a day (TID) | INTRAMUSCULAR | Status: DC | PRN

## 2014-07-31 MED ORDER — ACETAMINOPHEN 500 MG PO TABS
1000.0000 mg | ORAL_TABLET | Freq: Three times a day (TID) | ORAL | Status: DC | PRN
  Administered 2014-08-01 (×2): 1000 mg via ORAL
  Filled 2014-07-31 (×2): qty 2

## 2014-07-31 MED ORDER — NALOXONE HCL 0.4 MG/ML IJ SOLN: 0.4000 mg | INTRAMUSCULAR | Status: DC | PRN

## 2014-07-31 MED ORDER — SCOPOLAMINE 1 MG/3DAYS TD PT72
1.0000 | MEDICATED_PATCH | Freq: Once | TRANSDERMAL | Status: DC
  Filled 2014-07-31: qty 1

## 2014-07-31 NOTE — Lactation Note (Signed)
This note was copied from the chart of Bonnie Tuere Nwosu. Lactation Consultation Note  Patient Name: Bonnie Brown Date: 07/31/2014 Reason for consult: Initial assessment  This abby is 68 hours old and mom is an experienced breast feeding mother of 6 months 1st baby . Per  Mom baby has been spitty ( clear) , also noted at consult , had to use bulb syringe and burp several times. Mom and dad mentioned the baby has stooled several times , still mec. LC suggested burping the baby before latch, In between breast and after . Keeps the gas moving downward.  @ consult , [place] assisted with working on positioning and depth. Baby seems to be more relaxed and latches deeper on the right  Compared to the left. LC did notice a high palate. Once she settles down , she is able to sustain a latch with swallows   the left nipple semi inverted , with some areola edema, mom instructed prior to latch , breast massage, hand express, prep pump  To make the nipple and areola more elastic for a deeper latch. @ consult on and odd pattern in football position left breast , baby not has calm.  Mother informed of post-discharge support and given phone number to the lactation department, including services for phone call assistance; out-patient appointments; and breastfeeding support group. List of other breastfeeding resources in the community given in the handout. Encouraged mother to call for problems or concerns related to breastfeeding.    Maternal Data Has patient been taught Hand Expression?: Yes Does the patient have breastfeeding experience prior to this delivery?: Yes  Feeding Feeding Type: Breast Fed Length of feed: 10 min  LATCH Score/Interventions Latch: Grasps breast easily, tongue down, lips flanged, rhythmical sucking. Intervention(s): Adjust position;Assist with latch;Breast massage;Breast compression  Audible Swallowing: A few with stimulation  Type of Nipple: Everted at rest  and after stimulation  Comfort (Breast/Nipple): Soft / non-tender     Hold (Positioning): Assistance needed to correctly position infant at breast and maintain latch. Intervention(s): Breastfeeding basics reviewed;Support Pillows;Position options;Skin to skin  LATCH Score: 8  Lactation Tools Discussed/Used Tools: Pump;Shells;Flanges (increasing falnge for the left breast due to semi inverted nipple ) Flange Size: 27 Shell Type: Inverted Breast pump type: Manual   Consult Status Consult Status: Follow-up Date: 08/01/14 Follow-up type: In-patient    Myer Haff 07/31/2014, 4:04 PM

## 2014-07-31 NOTE — Progress Notes (Signed)
Subjective: Postpartum Day #1: Cesarean Delivery Patient reports minimal incisional pain and tolerating PO.    Objective: Vital signs in last 24 hours: Temp:  [97.5 F (36.4 C)-98.8 F (37.1 C)] 98.2 F (36.8 C) (01/23 0522) Pulse Rate:  [73-110] 92 (01/23 0522) Resp:  [16-25] 18 (01/23 0522) BP: (112-140)/(60-87) 118/70 mmHg (01/23 0522) SpO2:  [95 %-99 %] 96 % (01/23 0522)  Physical Exam:  General: alert, cooperative, fatigued and no distress Lochia: appropriate Uterine Fundus: firm Incision: no significant drainage DVT Evaluation: No evidence of DVT seen on physical exam. Negative Homan's sign. No cords or calf tenderness. No significant calf/ankle edema.   Recent Labs  07/29/14 1045  HGB 10.8*  HCT 34.0*    Assessment/Plan: Status post Cesarean section. Doing well postoperatively.  Anticipate discharge 1/25.  Bonnie Brown 07/31/2014, 6:15 AM

## 2014-08-01 MED ORDER — IBUPROFEN 600 MG PO TABS: 600.0000 mg | ORAL_TABLET | Freq: Four times a day (QID) | ORAL | Status: DC

## 2014-08-01 MED ORDER — OXYCODONE-ACETAMINOPHEN 5-325 MG PO TABS: 1.0000 | ORAL_TABLET | ORAL | Status: DC | PRN

## 2014-08-01 NOTE — Discharge Summary (Signed)
Obstetric Discharge Summary Reason for Admission: cesarean section Prenatal Procedures: ultrasound Intrapartum Procedures: cesarean: low cervical, transverse Postpartum Procedures: none Complications-Operative and Postpartum: none HEMOGLOBIN  Date Value Ref Range Status  07/31/2014 9.7* 12.0 - 15.0 g/dL Final  11/21/2012 8.6* 12.2 - 16.2 g/dL Final  08/13/2012 10.4 g/dL Final   HCT  Date Value Ref Range Status  07/31/2014 30.3* 36.0 - 46.0 % Final  08/13/2012 32 % Final    Physical Exam:  General: alert, cooperative, appears older than stated age and no distress Lochia: appropriate Uterine Fundus: firm Incision: healing well, no significant drainage, no dehiscence, no significant erythema DVT Evaluation: No evidence of DVT seen on physical exam. Negative Homan's sign. No cords or calf tenderness. No significant calf/ankle edema.  Discharge Diagnoses: Term Pregnancy-delivered  Discharge Information: Date: 08/01/2014 Activity: pelvic rest Diet: routine Medications: PNV, Ibuprofen and Percocet Condition: stable and improved Instructions: refer to practice specific booklet Discharge to: home   Newborn Data: Live born female  Birth Weight: 7 lb 15.7 oz (3620 g) APGAR: 9, 9  Home with mother.  Bonnie Brown 08/01/2014, 8:32 AM

## 2014-08-02 ENCOUNTER — Encounter (HOSPITAL_COMMUNITY): Payer: Self-pay | Admitting: Obstetrics and Gynecology

## 2014-08-02 LAB — TYPE AND SCREEN
ABO/RH(D): A POS
ANTIBODY SCREEN: NEGATIVE
Unit division: 0
Unit division: 0

## 2014-08-06 ENCOUNTER — Ambulatory Visit (INDEPENDENT_AMBULATORY_CARE_PROVIDER_SITE_OTHER): Payer: PRIVATE HEALTH INSURANCE | Admitting: Adult Health

## 2014-08-06 ENCOUNTER — Encounter: Payer: PRIVATE HEALTH INSURANCE | Admitting: Obstetrics & Gynecology

## 2014-08-06 ENCOUNTER — Encounter: Payer: Self-pay | Admitting: Adult Health

## 2014-08-06 VITALS — BP 148/88 | Ht 64.0 in | Wt 185.0 lb

## 2014-08-06 DIAGNOSIS — R030 Elevated blood-pressure reading, without diagnosis of hypertension: Secondary | ICD-10-CM | POA: Diagnosis not present

## 2014-08-06 DIAGNOSIS — Z98891 History of uterine scar from previous surgery: Secondary | ICD-10-CM

## 2014-08-06 DIAGNOSIS — IMO0001 Reserved for inherently not codable concepts without codable children: Secondary | ICD-10-CM

## 2014-08-06 MED ORDER — HYDROCHLOROTHIAZIDE 12.5 MG PO CAPS: 12.5000 mg | ORAL_CAPSULE | Freq: Every day | ORAL | Status: DC

## 2014-08-06 NOTE — Progress Notes (Signed)
Subjective:     Patient ID: Bonnie Brown, female   DOB: December 23, 1980, 34 y.o.   MRN: [id number]  HPI Bonnie Brown is a 34 year old white female in for incision check, had C section 07/30/14 by Dr Glo Herring, baby girl Lurena Nida 8 lbs.She is breastfeeding.Has some burning left side of incision.Has mild headache and some swelling.No loss of vision.Her HGB was 9.7 1/23 at hospital.Has some constipation but the prune juice worked.  Review of Systems See HPI Reviewed past medical,surgical, social and family history. Reviewed medications and allergies.     Objective:   Physical Exam BP 148/88 mmHg  Ht 5\' 4"  (1.626 m)  Wt 185 lb (83.915 kg)  BMI 31.74 kg/m2  LMP 06/09/2013  Breastfeeding? YesDTRs 2-3+ no clonus, no RUQ tenderness, mild swelling non pitting in ankles, incision clean and edges together, honey comb dressing removed, steri strips intact, no redness or odor.    Assessment:     SP C section post op check Elevated BP    Plan:    Check CBC and CMP Rx microzide 12.5 mg #30 take 2 today and 1 daily Increase water and rest, eat spinach and red meat and continue prenatal vitamins Return 2/3 for BP check

## 2014-08-06 NOTE — Patient Instructions (Signed)
Increase water and rest Take fluid pill Increase spinach and red meat Return 2/3

## 2014-08-07 LAB — COMPREHENSIVE METABOLIC PANEL
A/G RATIO: 1.3 (ref 1.1–2.5)
ALK PHOS: 108 IU/L (ref 39–117)
ALT: 16 IU/L (ref 0–32)
AST: 18 IU/L (ref 0–40)
Albumin: 3.8 g/dL (ref 3.5–5.5)
BUN/Creatinine Ratio: 13 (ref 8–20)
BUN: 10 mg/dL (ref 6–20)
CALCIUM: 9 mg/dL (ref 8.7–10.2)
CO2: 24 mmol/L (ref 18–29)
Chloride: 102 mmol/L (ref 97–108)
Creatinine, Ser: 0.77 mg/dL (ref 0.57–1.00)
GFR, EST AFRICAN AMERICAN: 117 mL/min/{1.73_m2} (ref >59)
GFR, EST NON AFRICAN AMERICAN: 102 mL/min/{1.73_m2} (ref >59)
Globulin, Total: 2.9 g/dL (ref 1.5–4.5)
Glucose: 96 mg/dL (ref 65–99)
POTASSIUM: 4.3 mmol/L (ref 3.5–5.2)
Sodium: 142 mmol/L (ref 134–144)
Total Bilirubin: <0.2 mg/dL (ref 0.0–1.2)
Total Protein: 6.7 g/dL (ref 6.0–8.5)

## 2014-08-07 LAB — CBC
HCT: 37.4 % (ref 34.0–46.6)
Hemoglobin: 11.7 g/dL (ref 11.1–15.9)
MCH: 26.8 pg (ref 26.6–33.0)
MCHC: 31.3 g/dL — AB (ref 31.5–35.7)
MCV: 86 fL (ref 79–97)
Platelets: 491 10*3/uL — ABNORMAL HIGH (ref 150–379)
RBC: 4.37 x10E6/uL (ref 3.77–5.28)
RDW: 15.2 % (ref 12.3–15.4)
WBC: 8.9 10*3/uL (ref 3.4–10.8)

## 2014-08-09 ENCOUNTER — Telehealth: Payer: Self-pay | Admitting: Adult Health

## 2014-08-09 NOTE — Telephone Encounter (Signed)
Left message labs good,keep appt

## 2014-08-11 ENCOUNTER — Ambulatory Visit (INDEPENDENT_AMBULATORY_CARE_PROVIDER_SITE_OTHER): Payer: PRIVATE HEALTH INSURANCE | Admitting: Adult Health

## 2014-08-11 ENCOUNTER — Encounter: Payer: Self-pay | Admitting: Adult Health

## 2014-08-11 VITALS — BP 160/88 | Ht 64.0 in | Wt 180.5 lb

## 2014-08-11 DIAGNOSIS — IMO0001 Reserved for inherently not codable concepts without codable children: Secondary | ICD-10-CM

## 2014-08-11 DIAGNOSIS — R030 Elevated blood-pressure reading, without diagnosis of hypertension: Secondary | ICD-10-CM | POA: Diagnosis not present

## 2014-08-11 NOTE — Progress Notes (Signed)
Subjective:     Patient ID: Bonnie Brown, female   DOB: 05-25-1981, 34 y.o.   MRN: [id number]  HPI Bonnie Brown is a 34 year old white female sp C section 07/30/14 in for BP check after starting Microzide 12.5 mg last week for elevated BP.BP at home 120/88, but was up most of last night.Prune juice is working and breast feeding going well. Has been feeling good, is bleeding on and off.  Review of Systems See HPI Reviewed past medical,surgical, social and family history. Reviewed medications and allergies.     Objective:   Physical Exam BP 160/88 mmHg  Ht 5\' 4"  (1.626 m)  Wt 180 lb 8 oz (81.874 kg)  BMI 30.97 kg/m2  LMP 06/09/2013  Breastfeeding? Yes Skin warm and dry.  Lungs: clear to ausculation bilaterally. Cardiovascular: regular rate and rhythm.DTRs 2+ no clonus, Has lost 4.5 lbs.Incision looks good, no redness steri strips still on, can drive tomorrow.Discussed with Dr Elonda Husky will try Microzide bid for a few days.    Assessment:     Elevated BP    Plan:     Try taking Microzide 12.5 1 bid and check BP at home  Return in 5 days for recheck

## 2014-08-11 NOTE — Patient Instructions (Signed)
Try taking microzide 1 bid Recheck 5 days

## 2014-08-16 ENCOUNTER — Encounter: Payer: Self-pay | Admitting: Adult Health

## 2014-08-16 ENCOUNTER — Ambulatory Visit (INDEPENDENT_AMBULATORY_CARE_PROVIDER_SITE_OTHER): Payer: PRIVATE HEALTH INSURANCE | Admitting: Adult Health

## 2014-08-16 VITALS — BP 134/84 | Ht 64.0 in | Wt 180.0 lb

## 2014-08-16 DIAGNOSIS — IMO0001 Reserved for inherently not codable concepts without codable children: Secondary | ICD-10-CM

## 2014-08-16 DIAGNOSIS — L989 Disorder of the skin and subcutaneous tissue, unspecified: Secondary | ICD-10-CM | POA: Diagnosis not present

## 2014-08-16 DIAGNOSIS — R238 Other skin changes: Secondary | ICD-10-CM | POA: Insufficient documentation

## 2014-08-16 DIAGNOSIS — R030 Elevated blood-pressure reading, without diagnosis of hypertension: Secondary | ICD-10-CM

## 2014-08-16 MED ORDER — NYSTATIN-TRIAMCINOLONE 100000-0.1 UNIT/GM-% EX OINT: 1.0000 "application " | TOPICAL_OINTMENT | Freq: Two times a day (BID) | CUTANEOUS | Status: DC

## 2014-08-16 NOTE — Progress Notes (Signed)
Subjective:     Patient ID: Bonnie Brown, female   DOB: 27-Apr-1981, 34 y.o.   MRN: [id number]  HPI Bonnie Brown is a 34 year old white female in for BP check and complains of skin itching at incision site for about 4 days.  Review of Systems  Patient denies any headaches, hearing loss, fatigue, blurred vision, shortness of breath, chest pain, abdominal pain, problems with bowel movements, urination, or intercourse,not having sex. No joint pain or mood swings.Has skin itching at incision.  Reviewed past medical,surgical, social and family history. Reviewed medications and allergies.     Objective:   Physical Exam BP 134/84 mmHg  Ht 5\' 4"  (1.626 m)  Wt 180 lb (81.647 kg)  BMI 30.88 kg/m2  Breastfeeding? Yes   Skin warm and dry, abdomen soft and non tender, steri strips removed,edges together,  and there is some irritation and redness where strips where,cleansed with water and dried and she said it felt better, Dr Bonnie Brown in to co examine.No swelling in ankles or hands. Will try mytrex cream for itching and irritation.  Assessment:     Elevated BP Skin irritation from steri strips    Plan:     Rx mytrex cream use 2-3 x daily prn or can get cortisone 1% cream if mytrex too expensive Return 2/29 for postpartum visit Continue BP meds daily and check BP at home if elevated call

## 2014-08-16 NOTE — Patient Instructions (Signed)
Use mytrex 2-3 x daily prn  Return 2/29 for postpartum visit Take BP at home and call, take meds 1 daily til then

## 2014-09-03 ENCOUNTER — Other Ambulatory Visit: Payer: Self-pay | Admitting: *Deleted

## 2014-09-03 MED ORDER — LEVOTHYROXINE SODIUM 50 MCG PO TABS: 50.0000 ug | ORAL_TABLET | Freq: Every day | ORAL | Status: DC

## 2014-09-06 ENCOUNTER — Ambulatory Visit (INDEPENDENT_AMBULATORY_CARE_PROVIDER_SITE_OTHER): Payer: PRIVATE HEALTH INSURANCE | Admitting: Adult Health

## 2014-09-06 ENCOUNTER — Encounter: Payer: Self-pay | Admitting: Adult Health

## 2014-09-06 DIAGNOSIS — E049 Nontoxic goiter, unspecified: Secondary | ICD-10-CM | POA: Insufficient documentation

## 2014-09-06 NOTE — Progress Notes (Signed)
Patient ID: Bonnie Brown, female   DOB: Nov 14, 1980, 34 y.o.   MRN: [id number] Bonnie Brown is a 34 year old white female, married in for postpartum visit.Her BP was elevated and she was taking Microzide but has stopped and is doing well.  Delivery Date:07/30/14, had repeat C section by Dr Josie Saunders had a baby girl,Olivia Grace 8 lbs  Method of Delivery: Repeat C-section  Sexual Activity since delivery: NO  Method of Feeding: Breast and bottle  Number of weeks bleeding post delivery: 4 weeks  Review of Systems: Patient denies any headaches, hearing loss, fatigue, blurred vision, shortness of breath, chest pain, abdominal pain, problems with bowel movements, urination, or intercourse(no sex yet). No joint pain or mood swings.  Reviewed past medical,surgical, social and family history. Reviewed medications and allergies.  Depression Score: 0 BP 140/80 mmHg  Pulse 82  Ht 5\' 4"  (1.626 m)  Wt 177 lb 8 oz (80.513 kg)  BMI 30.45 kg/m2  Breastfeeding? YesSkin warm and dry, thyroid is enlarged, see doctor at [facility], has had biopsy in 2013. Abdomen is soft and non tender, no HSM, incision is well healed, edges together, no redness Pelvic Exam:   External genitalia is normal in appearance, no lesions.  The vagina has good color, moisture and rugae, no lesions.Urethra has no masses or tenderness noted. The cervix is bulbous.  Uterus is felt to be normal size, shape, and contour, well involuted.  No adnexal masses or tenderness noted.Bladder is non tender, no masses felt.No hemorrhoids noted. Husband to get vasectomy.  Impression:  Status post delivery, post partum check, depression screening, enlarged thyroid   Plan:   Note given to return to work 09/22/14 Pap and physical in 1 year Check BP at home, call with any concerns Use condoms

## 2014-09-06 NOTE — Patient Instructions (Signed)
Use condoms Pap and physical in 1 year Check  BP at home occasionally call with any concerns

## 2015-04-06 ENCOUNTER — Telehealth: Payer: Self-pay | Admitting: Adult Health

## 2015-04-06 NOTE — Telephone Encounter (Signed)
Make appt to be seen, has been over 6 months since last visit

## 2015-04-28 ENCOUNTER — Encounter: Payer: Self-pay | Admitting: Adult Health

## 2015-04-28 ENCOUNTER — Ambulatory Visit (INDEPENDENT_AMBULATORY_CARE_PROVIDER_SITE_OTHER): Payer: PRIVATE HEALTH INSURANCE | Admitting: Adult Health

## 2015-04-28 VITALS — BP 136/80 | HR 96 | Ht 64.0 in | Wt 179.5 lb

## 2015-04-28 DIAGNOSIS — Z30011 Encounter for initial prescription of contraceptive pills: Secondary | ICD-10-CM

## 2015-04-28 DIAGNOSIS — Z3202 Encounter for pregnancy test, result negative: Secondary | ICD-10-CM

## 2015-04-28 DIAGNOSIS — Z309 Encounter for contraceptive management, unspecified: Secondary | ICD-10-CM | POA: Insufficient documentation

## 2015-04-28 LAB — POCT URINE PREGNANCY: PREG TEST UR: NEGATIVE

## 2015-04-28 MED ORDER — NORETHIN-ETH ESTRAD-FE BIPHAS 1 MG-10 MCG / 10 MCG PO TABS: 1.0000 | ORAL_TABLET | Freq: Every day | ORAL | Status: DC

## 2015-04-28 NOTE — Patient Instructions (Signed)
Start OCs today sue condoms Follow up in 3 months

## 2015-04-28 NOTE — Progress Notes (Signed)
Subjective:     Patient ID: Bonnie Brown, female   DOB: 1980/07/16, 34 y.o.   MRN: [id number]  HPI Emojean is a 34 year old white female,married,G3P2A1, in to get on birth control.She wants the pill.  Review of Systems Patient denies any headaches, hearing loss, fatigue, blurred vision, shortness of breath, chest pain, abdominal pain, problems with bowel movements, urination, or intercourse. No joint pain or mood swings.She is having thyroid removed 11/7 for multiple nodules, at [facility].  Reviewed past medical,surgical, social and family history. Reviewed medications and allergies.     Objective:   Physical Exam BP 136/80 mmHg  Pulse 96  Ht 5\' 4"  (1.626 m)  Wt 179 lb 8 oz (81.421 kg)  BMI 30.80 kg/m2  LMP 04/23/2015  Breastfeeding? No UPT negative, Skin warm and dry. Neck: mid line trachea,enlarged thyroid, good ROM, no lymphadenopathy noted. Lungs: clear to ausculation bilaterally. Cardiovascular: regular rate and rhythm.    Assessment:     Contraceptive management    Plan:     Rx lo loestrin disp 1 pack take 1 daily with 11 refills Use condoms for 1 pack  Return in 3 months for follow up and BP check

## 2015-05-16 HISTORY — PX: TOTAL THYROIDECTOMY: SHX2547

## 2015-06-20 ENCOUNTER — Encounter: Payer: Self-pay | Admitting: Adult Health

## 2015-06-20 DIAGNOSIS — E890 Postprocedural hypothyroidism: Secondary | ICD-10-CM | POA: Insufficient documentation

## 2015-07-05 ENCOUNTER — Ambulatory Visit: Payer: PRIVATE HEALTH INSURANCE | Admitting: Women's Health

## 2015-07-05 ENCOUNTER — Other Ambulatory Visit: Payer: PRIVATE HEALTH INSURANCE

## 2015-07-06 ENCOUNTER — Other Ambulatory Visit: Payer: PRIVATE HEALTH INSURANCE

## 2015-07-07 ENCOUNTER — Other Ambulatory Visit: Payer: PRIVATE HEALTH INSURANCE

## 2015-07-12 ENCOUNTER — Other Ambulatory Visit: Payer: PRIVATE HEALTH INSURANCE

## 2015-07-12 ENCOUNTER — Encounter: Payer: Self-pay | Admitting: Adult Health

## 2015-07-12 ENCOUNTER — Ambulatory Visit (INDEPENDENT_AMBULATORY_CARE_PROVIDER_SITE_OTHER): Payer: PRIVATE HEALTH INSURANCE | Admitting: Adult Health

## 2015-07-12 VITALS — BP 160/100 | HR 82 | Ht 64.0 in | Wt 188.0 lb

## 2015-07-12 DIAGNOSIS — N63 Unspecified lump in breast: Secondary | ICD-10-CM | POA: Diagnosis not present

## 2015-07-12 DIAGNOSIS — I10 Essential (primary) hypertension: Secondary | ICD-10-CM | POA: Diagnosis not present

## 2015-07-12 DIAGNOSIS — E890 Postprocedural hypothyroidism: Secondary | ICD-10-CM | POA: Diagnosis not present

## 2015-07-12 DIAGNOSIS — N6320 Unspecified lump in the left breast, unspecified quadrant: Secondary | ICD-10-CM | POA: Insufficient documentation

## 2015-07-12 MED ORDER — HYDROCHLOROTHIAZIDE 12.5 MG PO CAPS: 12.5000 mg | ORAL_CAPSULE | Freq: Every day | ORAL | Status: DC

## 2015-07-12 NOTE — Patient Instructions (Signed)
Take microzide for BP Decrease salt and sugars Follow up in 2 weeks Get mammogram 1/17 at 9:30 at [facility]

## 2015-07-12 NOTE — Progress Notes (Signed)
Subjective:     Patient ID: Bonnie Brown, female   DOB: 04-Nov-1980, 35 y.o.   MRN: [id number]  HPI Bonnie Brown is a 35 year old white female, married, G3P2, sp thyroidectomy in November 2016 in for labs and has left breast mass, noticed before surgery.She has gained some weight too.Has history of low calcium level and will tingle around mouth.She has history of Hodgkin's with radiation.  Review of Systems Patient denies any headaches, hearing loss, fatigue, blurred vision, shortness of breath, chest pain, abdominal pain, problems with bowel movements, urination, or intercourse. No joint pain or mood swings.See HPI for positives. Reviewed past medical,surgical, social and family history. Reviewed medications and allergies.     Objective:   Physical Exam BP 160/100 mmHg  Pulse 82  Ht 5\' 4"  (1.626 m)  Wt 188 lb (85.276 kg)  BMI 32.25 kg/m2  LMP 06/30/2015  Breastfeeding? No Skin warm and dry. Neck: mid line trachea, thyroid absent, has well healed scar, good ROM, no lymphadenopathy noted.  Breasts:no dominate palpable mass, retraction or nipple discharge on right, on left no retraction or nipple discharge has tender non mobile mass at 12 o'clock x 2, 1 about 1 FB from areola and the other about 3 FB from areola.    Assessment:     Hypothyroidism, post op Left breast mass Hypertension     Plan:     Check TSH and CMP Diagnostic bilateral mammogram and [place] 1/17 at 9:30 at [place] 12.5 mg #30 take 1 daily with 1 refill Recheck BP in 2 weeks Return in 3 months for TSH

## 2015-07-13 ENCOUNTER — Telehealth: Payer: Self-pay | Admitting: Adult Health

## 2015-07-13 LAB — COMPREHENSIVE METABOLIC PANEL WITH GFR
ALT: 11 [IU]/L (ref 0–32)
AST: 14 [IU]/L (ref 0–40)
Albumin/Globulin Ratio: 1.3 (ref 1.1–2.5)
Albumin: 4.4 g/dL (ref 3.5–5.5)
Alkaline Phosphatase: 87 [IU]/L (ref 39–117)
BUN/Creatinine Ratio: 14 (ref 8–20)
BUN: 11 mg/dL (ref 6–20)
Bilirubin Total: <0.2 mg/dL (ref 0.0–1.2)
CO2: 24 mmol/L (ref 18–29)
Calcium: 9.3 mg/dL (ref 8.7–10.2)
Chloride: 100 mmol/L (ref 96–106)
Creatinine, Ser: 0.8 mg/dL (ref 0.57–1.00)
GFR calc Af Amer: 111 mL/min/{1.73_m2}
GFR calc non Af Amer: 96 mL/min/{1.73_m2}
Globulin, Total: 3.4 g/dL (ref 1.5–4.5)
Glucose: 84 mg/dL (ref 65–99)
Potassium: 4.3 mmol/L (ref 3.5–5.2)
Sodium: 141 mmol/L (ref 134–144)
Total Protein: 7.8 g/dL (ref 6.0–8.5)

## 2015-07-13 LAB — TSH: TSH: 1.45 u[IU]/mL (ref 0.450–4.500)

## 2015-07-13 NOTE — Telephone Encounter (Signed)
Left message labs all normal 

## 2015-07-26 ENCOUNTER — Ambulatory Visit (HOSPITAL_COMMUNITY)
Admission: RE | Admit: 2015-07-26 | Discharge: 2015-07-26 | Disposition: A | Discharge status: Home / Self Care | Payer: PRIVATE HEALTH INSURANCE | Source: Ambulatory Visit | Attending: Adult Health | Admitting: Adult Health

## 2015-07-26 ENCOUNTER — Encounter: Payer: Self-pay | Admitting: Adult Health

## 2015-07-26 ENCOUNTER — Other Ambulatory Visit: Payer: Self-pay | Admitting: Adult Health

## 2015-07-26 ENCOUNTER — Ambulatory Visit (INDEPENDENT_AMBULATORY_CARE_PROVIDER_SITE_OTHER): Payer: PRIVATE HEALTH INSURANCE | Admitting: Adult Health

## 2015-07-26 VITALS — BP 140/80 | HR 84 | Ht 64.0 in | Wt 182.5 lb

## 2015-07-26 DIAGNOSIS — R928 Other abnormal and inconclusive findings on diagnostic imaging of breast: Secondary | ICD-10-CM | POA: Insufficient documentation

## 2015-07-26 DIAGNOSIS — N63 Unspecified lump in breast: Secondary | ICD-10-CM | POA: Diagnosis not present

## 2015-07-26 DIAGNOSIS — R921 Mammographic calcification found on diagnostic imaging of breast: Secondary | ICD-10-CM | POA: Insufficient documentation

## 2015-07-26 DIAGNOSIS — I10 Essential (primary) hypertension: Secondary | ICD-10-CM

## 2015-07-26 DIAGNOSIS — N6320 Unspecified lump in the left breast, unspecified quadrant: Secondary | ICD-10-CM

## 2015-07-26 NOTE — Patient Instructions (Signed)
Keep BP Log  Follow 08/08/15 Continue current meds

## 2015-07-26 NOTE — Progress Notes (Signed)
Subjective:     Patient ID: Bonnie Brown, female   DOB: Mar 12, 1981, 35 y.o.   MRN: [id number]  HPI Bonnie Brown is a 35 year old white female, married in for BP recheck and to discuss mammogram done this am. The mammogram showed area of calcification in inner left breast, concerning for DCIS, has biopsy scheduled for 08/16/15.She has history of Hodgkin's.  Review of Systems Patient denies any headaches, hearing loss, fatigue, blurred vision, shortness of breath, chest pain, abdominal pain, problems with bowel movements, urination, or intercourse. No joint pain or mood swings. Reviewed past medical,surgical, social and family history. Reviewed medications and allergies.     Objective:   Physical Exam BP 140/80 mmHg  Pulse 84  Ht 5\' 4"  (1.626 m)  Wt 182 lb 8 oz (82.781 kg)  BMI 31.31 kg/m2  LMP 06/30/2015  Breastfeeding? No Face time 15 minutes, discussed that BP OK, will continue current meds for now, and reviewed mammogram with her again and discussed that if + for cancer may need to rethink birth control options.She seems calm at present.    Assessment:     Hypertension Abnormal mammogram left breast    Plan:     Keep BP log Follow up 08/08/15 for BP check and ROS Continue current meds Get breast biopsy 08/16/15

## 2015-07-29 ENCOUNTER — Ambulatory Visit: Payer: PRIVATE HEALTH INSURANCE | Admitting: Adult Health

## 2015-08-09 ENCOUNTER — Encounter: Payer: Self-pay | Admitting: Adult Health

## 2015-08-09 ENCOUNTER — Ambulatory Visit (INDEPENDENT_AMBULATORY_CARE_PROVIDER_SITE_OTHER): Payer: PRIVATE HEALTH INSURANCE | Admitting: Adult Health

## 2015-08-09 VITALS — BP 128/84 | HR 102 | Ht 64.0 in | Wt 183.0 lb

## 2015-08-09 DIAGNOSIS — Z30011 Encounter for initial prescription of contraceptive pills: Secondary | ICD-10-CM

## 2015-08-09 DIAGNOSIS — Z3202 Encounter for pregnancy test, result negative: Secondary | ICD-10-CM | POA: Diagnosis not present

## 2015-08-09 DIAGNOSIS — I10 Essential (primary) hypertension: Secondary | ICD-10-CM

## 2015-08-09 LAB — POCT URINE PREGNANCY: Preg Test, Ur: NEGATIVE

## 2015-08-09 MED ORDER — NORETHINDRONE 0.35 MG PO TABS: 1.0000 | ORAL_TABLET | Freq: Every day | ORAL | Status: DC

## 2015-08-09 NOTE — Patient Instructions (Signed)
Start micronor with next period,use condoms for 1 month Follow up 5/1 with me Keep taking microzide

## 2015-08-09 NOTE — Progress Notes (Signed)
Subjective:     Patient ID: Bonnie Brown, female   DOB: 18-Feb-1981, 35 y.o.   MRN: [id number]  HPI Bonnie Brown is a 35 year old white female female   DOB: 18-Feb-1981, 35 y.o.   MRN: [id number]  HPI Bonnie Brown is a 35 year old white female in for BP check, has been around 117/70, 120 /60 at home, she stopped her lo loesrtin 1/19 due to headaches and has felt better.Has appt 08/16/15 for breast biopsy.   Review of Systems Patient denies any daily headaches, hearing loss, fatigue, blurred vision, shortness of breath, chest pain, abdominal pain, problems with bowel movements, urination, or intercourse. No joint pain or mood swings.Period late Reviewed past medical,surgical, social and family history. Reviewed medications and allergies.     Objective:   Physical Exam BP 128/84 mmHg  Pulse 102  Ht 5\' 4"  (1.626 m)  Wt 183 lb (83.008 kg)  BMI 31.40 kg/m2  LMP 06/30/2015  Breastfeeding? No UPT negative. Skin warm and dry. Lungs: clear to ausculation bilaterally. Cardiovascular: regular rate and rhythm, will try POP and she agrees.     Assessment:     Hypertension Contraceptive management    Plan:     Continue microzide  Rx micronor disp 1 pack take 1 daily, with 11 refills, start with next period, use condoms x 1 month Follow up 5/1

## 2015-08-10 DIAGNOSIS — Z853 Personal history of malignant neoplasm of breast: Secondary | ICD-10-CM

## 2015-08-10 HISTORY — DX: Personal history of malignant neoplasm of breast: Z85.3

## 2015-08-15 ENCOUNTER — Other Ambulatory Visit: Payer: Self-pay | Admitting: Adult Health

## 2015-08-15 DIAGNOSIS — R921 Mammographic calcification found on diagnostic imaging of breast: Secondary | ICD-10-CM

## 2015-08-16 ENCOUNTER — Ambulatory Visit
Admission: RE | Admit: 2015-08-16 | Discharge: 2015-08-16 | Disposition: A | Discharge status: Home / Self Care | Payer: 59 | Source: Ambulatory Visit | Attending: Adult Health | Admitting: Adult Health

## 2015-08-16 ENCOUNTER — Ambulatory Visit
Admission: RE | Admit: 2015-08-16 | Discharge: 2015-08-16 | Disposition: A | Discharge status: Home / Self Care | Payer: PRIVATE HEALTH INSURANCE | Source: Ambulatory Visit | Attending: Adult Health | Admitting: Adult Health

## 2015-08-16 DIAGNOSIS — R921 Mammographic calcification found on diagnostic imaging of breast: Secondary | ICD-10-CM

## 2015-08-19 ENCOUNTER — Other Ambulatory Visit: Payer: Self-pay | Admitting: Adult Health

## 2015-08-19 ENCOUNTER — Telehealth: Payer: Self-pay | Admitting: Adult Health

## 2015-08-19 NOTE — Telephone Encounter (Signed)
Left message I called and know she has appt with surgeon about breast

## 2015-08-25 ENCOUNTER — Other Ambulatory Visit (HOSPITAL_BASED_OUTPATIENT_CLINIC_OR_DEPARTMENT_OTHER): Payer: Self-pay | Admitting: General Surgery

## 2015-08-25 DIAGNOSIS — N6092 Unspecified benign mammary dysplasia of left breast: Secondary | ICD-10-CM

## 2015-08-30 ENCOUNTER — Other Ambulatory Visit: Payer: Self-pay | Admitting: General Surgery

## 2015-08-30 ENCOUNTER — Encounter (HOSPITAL_BASED_OUTPATIENT_CLINIC_OR_DEPARTMENT_OTHER): Payer: Self-pay | Admitting: *Deleted

## 2015-08-30 DIAGNOSIS — N6092 Unspecified benign mammary dysplasia of left breast: Secondary | ICD-10-CM

## 2015-08-30 NOTE — Pre-Procedure Instructions (Signed)
To go to [facility] for BMET, CBC, diff, EKG

## 2015-09-01 ENCOUNTER — Other Ambulatory Visit: Payer: Self-pay

## 2015-09-01 ENCOUNTER — Ambulatory Visit
Admission: RE | Admit: 2015-09-01 | Discharge: 2015-09-01 | Disposition: A | Discharge status: Home / Self Care | Payer: 59 | Source: Ambulatory Visit | Attending: General Surgery | Admitting: General Surgery

## 2015-09-01 ENCOUNTER — Encounter (HOSPITAL_COMMUNITY)
Admission: RE | Admit: 2015-09-01 | Discharge: 2015-09-01 | Disposition: A | Discharge status: Home / Self Care | Payer: PRIVATE HEALTH INSURANCE | Source: Ambulatory Visit | Attending: General Surgery | Admitting: General Surgery

## 2015-09-01 DIAGNOSIS — Z01812 Encounter for preprocedural laboratory examination: Secondary | ICD-10-CM | POA: Diagnosis not present

## 2015-09-01 DIAGNOSIS — N6092 Unspecified benign mammary dysplasia of left breast: Secondary | ICD-10-CM

## 2015-09-01 DIAGNOSIS — N62 Hypertrophy of breast: Secondary | ICD-10-CM | POA: Diagnosis not present

## 2015-09-01 DIAGNOSIS — I10 Essential (primary) hypertension: Secondary | ICD-10-CM | POA: Insufficient documentation

## 2015-09-01 DIAGNOSIS — Z01810 Encounter for preprocedural cardiovascular examination: Secondary | ICD-10-CM | POA: Diagnosis present

## 2015-09-01 LAB — CBC WITH DIFFERENTIAL/PLATELET
BASOS PCT: 0 %
Basophils Absolute: 0 10*3/uL (ref 0.0–0.1)
EOS ABS: 0.1 10*3/uL (ref 0.0–0.7)
EOS PCT: 2 %
HCT: 41 % (ref 36.0–46.0)
HEMOGLOBIN: 13.4 g/dL (ref 12.0–15.0)
LYMPHS ABS: 2.2 10*3/uL (ref 0.7–4.0)
LYMPHS PCT: 27 %
MCH: 29.4 pg (ref 26.0–34.0)
MCHC: 32.7 g/dL (ref 30.0–36.0)
MCV: 89.9 fL (ref 78.0–100.0)
Monocytes Absolute: 1 10*3/uL (ref 0.1–1.0)
Monocytes Relative: 12 %
NEUTROS ABS: 4.9 10*3/uL (ref 1.7–7.7)
NEUTROS PCT: 59 %
PLATELETS: 403 10*3/uL — AB (ref 150–400)
RBC: 4.56 MIL/uL (ref 3.87–5.11)
RDW: 13.3 % (ref 11.5–15.5)
WBC: 8.2 10*3/uL (ref 4.0–10.5)

## 2015-09-01 LAB — COMPREHENSIVE METABOLIC PANEL
ALT: 12 U/L — ABNORMAL LOW (ref 14–54)
AST: 14 U/L — ABNORMAL LOW (ref 15–41)
Albumin: 3.8 g/dL (ref 3.5–5.0)
Alkaline Phosphatase: 68 U/L (ref 38–126)
Anion gap: 8 (ref 5–15)
BUN: 10 mg/dL (ref 6–20)
CO2: 26 mmol/L (ref 22–32)
Calcium: 8.1 mg/dL — ABNORMAL LOW (ref 8.9–10.3)
Chloride: 106 mmol/L (ref 101–111)
Creatinine, Ser: 0.62 mg/dL (ref 0.44–1.00)
GFR calc Af Amer: >60 mL/min (ref >60)
GFR calc non Af Amer: >60 mL/min (ref >60)
Glucose, Bld: 88 mg/dL (ref 65–99)
Potassium: 3.6 mmol/L (ref 3.5–5.1)
Sodium: 140 mmol/L (ref 135–145)
Total Bilirubin: 0.5 mg/dL (ref 0.3–1.2)
Total Protein: 7.1 g/dL (ref 6.5–8.1)

## 2015-09-05 NOTE — H&P (Signed)
Bonnie Brown  Location: [facility] Patient #: [id number] DOB: 01-05-1981 Married / Language: English / Race: White Female      History of Present Illness  The patient is a 35 year old female who presents with a complaint of atypical ductal hyperplasia and LCIS left breast. This is a 35 year old Caucasian female, referred to me by Dr. Margarette Canada at the breast center of [facility] for evaluation of atypical ductal hyperplasia and LCIS left breast, lower inner quadrant. Derrek Monaco, NP in [place] provides primary care.  This patient has a history of mantle radiation for Hodgkin's disease in 1992. She's never had a breast problem before. She thought she felt some lumpiness in the left breast at the 12 o'clock position and went for imaging studies. Mammogram and ultrasound did not show anything abnormal to 12 o'clock position, but there was a 5.5 cm band of tight heterogeneous calcifications within the lower inner quadrant of the left breast. No mass effect was noted. Suspicious for DCIS. Image guided biopsy showed atypical ductal hyperplasia with calcifications and lobular neoplasia, lobular carcinoma in situ. She was referred.  Family history is negative for breast or ovarian cancer. A maternal grandmother had pancreatic cancer.  Comorbidities include the history of Hodgkin's disease with mantle radiation 1992. Total thyroidectomy for benign multinodular goiter at [facility] now on Synthroid. 2 cesarean sections. Benign hypertension  Social history reveals she is married and has 2 children. 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  We had a long discussion about this. I told her that her biopsy histology was a risk factor for cancer and that there could be in situ cancer present at this time that was missed with sampling error. Also told her that her radiation therapy was a risk for breast cancer. She is in full agreement that we go  ahead with lumpectomy to establish the diagnosis. She knows that this may simply be LCIS and atypical hyperplasia in which case nothing further would need to be done other than risk assessment. she knows that this may be cancer that may require a second operation if margins are positive or lymph node biopsy if invasive, but that seems unlikely however. She'll be scheduled for left breast lumpectomy with radioactive seed localization. I told her that I would remove some but not all of the calcifications because removing all of this would leave a significant defect. We will establish the diagnosis. I discussed the indications, details, techniques, and numerous risk of the surgery with her. She is aware of the risk of bleeding, infection, second operation of cancer or complications, nerve damage with chronic pain, minor or major cosmetic deformity. She understands all of these issues. At this time all of her questions were answered. She agrees with this plan.   Cancer High blood pressure Migraine Headache Thyroid Disease  Past Surgical History Breast Biopsy Left. Oral Surgery Thyroid Surgery  Diagnostic Studies History  Colonoscopy never Mammogram within last year Pap Smear 1-5 years ago  Allergies Seldane *ANTIHISTAMINES* Codeine Sulfate *ANALGESICS - OPIOID* Penicillin V *PENICILLINS* Hives, Rash.  edications Tylenol (500MG  Capsule, Oral) Active. Synthroid (125MCG Tablet, Oral) Active. Norethindrone (0.35MG  Tablet, Oral) Active. Medications Reconciled  Social History  Alcohol use Occasional alcohol use. Caffeine use Carbonated beverages, Tea. No drug use Tobacco use Never smoker.  Family History  Arthritis Father. Heart Disease Father. Hypertension Brother, Mother. Migraine Headache Brother, Sister.  Pregnancy / Birth History  Age at menarche 19 years. Contraceptive History Oral contraceptives. Durenda Age  3 Maternal age 84-35 Para  2 Regular periods    Review of Systems  General Not Present- Appetite Loss, Chills, Fatigue, Fever, Night Sweats, Weight Gain and Weight Loss. Skin Not Present- Change in Wart/Mole, Dryness, Hives, Jaundice, New Lesions, Non-Healing Wounds, Rash and Ulcer. HEENT Present- Seasonal Allergies. Not Present- Earache, Hearing Loss, Hoarseness, Nose Bleed, Oral Ulcers, Ringing in the Ears, Sinus Pain, Sore Throat, Visual Disturbances, Wears glasses/contact lenses and Yellow Eyes. Respiratory Not Present- Bloody sputum, Chronic Cough, Difficulty Breathing, Snoring and Wheezing. Breast Present- Breast Mass. Not Present- Breast Pain, Nipple Discharge and Skin Changes. Cardiovascular Not Present- Chest Pain, Difficulty Breathing Lying Down, Leg Cramps, Palpitations, Rapid Heart Rate, Shortness of Breath and Swelling of Extremities. Gastrointestinal Not Present- Abdominal Pain, Bloating, Bloody Stool, Change in Bowel Habits, Chronic diarrhea, Constipation, Difficulty Swallowing, Excessive gas, Gets full quickly at meals, Hemorrhoids, Indigestion, Nausea, Rectal Pain and Vomiting. Female Genitourinary Not Present- Frequency, Nocturia, Painful Urination, Pelvic Pain and Urgency. Musculoskeletal Not Present- Back Pain, Joint Pain, Joint Stiffness, Muscle Pain, Muscle Weakness and Swelling of Extremities. Neurological Present- Headaches. Not Present- Decreased Memory, Fainting, Numbness, Seizures, Tingling, Tremor, Trouble walking and Weakness. Psychiatric Not Present- Anxiety, Bipolar, Change in Sleep Pattern, Depression, Fearful and Frequent crying. Endocrine Not Present- Cold Intolerance, Excessive Hunger, Hair Changes, Heat Intolerance, Hot flashes and New Diabetes. Hematology Not Present- Easy Bruising, Excessive bleeding, Gland problems, HIV and Persistent Infections.  Vital Weight: 181 lb Height: 64in Body Surface Area: 1.87 m Body Mass Index: 31.07 kg/m  Temp.: 98.59F(Temporal)  Pulse:  83 (Regular)  BP: 126/74 (Sitting, Left Arm, Standard)      History and physical General Mental Status-Alert. General Appearance-Consistent with stated age. Hydration-Well hydrated. Voice-Normal.  Head and Neck Head-normocephalic, atraumatic with no lesions or palpable masses. Trachea-midline. Thyroid Gland Characteristics - normal size and consistency. Note: Well-healed thyroidectomy scar   Eye Eyeball - Bilateral-Extraocular movements intact. Sclera/Conjunctiva - Bilateral-No scleral icterus.  Chest and Lung Exam Chest and lung exam reveals -quiet, even and easy respiratory effort with no use of accessory muscles and on auscultation, normal breath sounds, no adventitious sounds and normal vocal resonance. Inspection Chest Wall - Normal. Back - normal. Note: Transverse incision overlying left clavicle from previous lymph node biopsy   Breast Note: Medium sized. Slightly lumpy but no dominant mass. Biopsy site left breast medially. No hematoma. No other abnormality in either breast. No other skin changes. No axillary adenopathy.   Cardiovascular Cardiovascular examination reveals -normal heart sounds, regular rate and rhythm with no murmurs and normal pedal pulses bilaterally.  Abdomen Inspection Inspection of the abdomen reveals - No Hernias. Skin - Scar - Note: Cesarean section scar. Pfannenstiel. Palpation/Percussion Palpation and Percussion of the abdomen reveal - Soft, Non Tender, No Rebound tenderness, No Rigidity (guarding) and No hepatosplenomegaly. Auscultation Auscultation of the abdomen reveals - Bowel sounds normal.  Neurologic Neurologic evaluation reveals -alert and oriented x 3 with no impairment of recent or remote memory. Mental Status-Normal.  Musculoskeletal Normal Exam - Left-Upper Extremity Strength Normal and Lower Extremity Strength Normal. Normal Exam - Right-Upper Extremity Strength Normal and Lower  Extremity Strength Normal.  Lymphatic Head & Neck  General Head & Neck Lymphatics: Bilateral - Description - Normal. Axillary  General Axillary Region: Bilateral - Description - Normal. Tenderness - Non Tender. Femoral & Inguinal  Generalized Femoral & Inguinal Lymphatics: Bilateral - Description - Normal. Tenderness - Non Tender.    Assessment and plan ATYPICAL DUCTAL HYPERPLASIA OF LEFT BREAST (N60.92)  Your recent imaging studies and biopsy show an area of atypical ductal hyperplasia in the left breast, lower inner quadrant There is a 5.5 cm area of calcifications in your left breast in this area, but there is no obvious mass. You have a history of radiation therapy to the chest for Hodgkin's disease All of these are risk factors for early breast cancer, and this area should be excised to confirm the diagnosis We have discussed this, and you agree  You will be scheduled for left breast lumpectomy with radioactive seed localization in the near future We have discussed the indications, techniques, and numerous risk of the surgery Please read the patient information booklet that we gave you.  HISTORY OF EXTERNAL BEAM RADIATION THERAPY (Z92.3) Impression: 1992 for Hodgkin's disease HISTORY OF HODGKIN'S DISEASE (Z85.71) HISTORY OF C-SECTION [id number]) BMI 31.0-31.9,ADULT (Z68.31) HISTORY OF THYROIDECTOMY, TOTAL (E89.0) Impression: Benign multinodular goiter. Weight far to [place] (I10)   [place] Dalbert Batman, M.D., [facility] General and Minimally invasive Surgery Breast and Colorectal Surgery Office:   [phone] Pager:   [phone]

## 2015-09-06 ENCOUNTER — Ambulatory Visit (HOSPITAL_BASED_OUTPATIENT_CLINIC_OR_DEPARTMENT_OTHER)
Admission: RE | Admit: 2015-09-06 | Discharge: 2015-09-06 | Disposition: A | Discharge status: Home / Self Care | Payer: PRIVATE HEALTH INSURANCE | Source: Ambulatory Visit | Attending: General Surgery | Admitting: General Surgery

## 2015-09-06 ENCOUNTER — Ambulatory Visit
Admission: RE | Admit: 2015-09-06 | Discharge: 2015-09-06 | Disposition: A | Discharge status: Home / Self Care | Payer: 59 | Source: Ambulatory Visit | Attending: General Surgery | Admitting: General Surgery

## 2015-09-06 ENCOUNTER — Ambulatory Visit (HOSPITAL_BASED_OUTPATIENT_CLINIC_OR_DEPARTMENT_OTHER): Payer: PRIVATE HEALTH INSURANCE | Admitting: Certified Registered"

## 2015-09-06 ENCOUNTER — Encounter (HOSPITAL_BASED_OUTPATIENT_CLINIC_OR_DEPARTMENT_OTHER): Payer: Self-pay | Admitting: Certified Registered"

## 2015-09-06 ENCOUNTER — Encounter (HOSPITAL_BASED_OUTPATIENT_CLINIC_OR_DEPARTMENT_OTHER)
Admission: RE | Disposition: A | Discharge status: Home / Self Care | Payer: Self-pay | Source: Ambulatory Visit | Attending: General Surgery

## 2015-09-06 DIAGNOSIS — N6092 Unspecified benign mammary dysplasia of left breast: Secondary | ICD-10-CM

## 2015-09-06 DIAGNOSIS — I10 Essential (primary) hypertension: Secondary | ICD-10-CM | POA: Diagnosis not present

## 2015-09-06 DIAGNOSIS — Z880 Allergy status to penicillin: Secondary | ICD-10-CM | POA: Diagnosis not present

## 2015-09-06 DIAGNOSIS — Z8571 Personal history of Hodgkin lymphoma: Secondary | ICD-10-CM | POA: Insufficient documentation

## 2015-09-06 DIAGNOSIS — Z923 Personal history of irradiation: Secondary | ICD-10-CM | POA: Insufficient documentation

## 2015-09-06 DIAGNOSIS — R928 Other abnormal and inconclusive findings on diagnostic imaging of breast: Secondary | ICD-10-CM | POA: Diagnosis present

## 2015-09-06 DIAGNOSIS — C50312 Malignant neoplasm of lower-inner quadrant of left female breast: Secondary | ICD-10-CM | POA: Insufficient documentation

## 2015-09-06 DIAGNOSIS — Z6830 Body mass index (BMI) 30.0-30.9, adult: Secondary | ICD-10-CM | POA: Insufficient documentation

## 2015-09-06 DIAGNOSIS — E890 Postprocedural hypothyroidism: Secondary | ICD-10-CM | POA: Diagnosis not present

## 2015-09-06 HISTORY — DX: Essential (primary) hypertension: I10

## 2015-09-06 HISTORY — DX: Personal history of Hodgkin lymphoma: Z85.71

## 2015-09-06 HISTORY — DX: Cardiac arrhythmia, unspecified: I49.9

## 2015-09-06 HISTORY — DX: Personal history of antineoplastic chemotherapy: Z92.21

## 2015-09-06 HISTORY — PX: BREAST LUMPECTOMY WITH RADIOACTIVE SEED LOCALIZATION: SHX6424

## 2015-09-06 HISTORY — DX: Migraine, unspecified, not intractable, without status migrainosus: G43.909

## 2015-09-06 SURGERY — BREAST LUMPECTOMY WITH RADIOACTIVE SEED LOCALIZATION
Anesthesia: General | Site: Breast | Laterality: Left

## 2015-09-06 MED ORDER — HYDROCODONE-ACETAMINOPHEN 5-325 MG PO TABS: 1.0000 | ORAL_TABLET | Freq: Four times a day (QID) | ORAL | Status: DC | PRN

## 2015-09-06 MED ORDER — ACETAMINOPHEN 325 MG PO TABS: 650.0000 mg | ORAL_TABLET | ORAL | Status: DC | PRN

## 2015-09-06 MED ORDER — HYDROMORPHONE HCL 1 MG/ML IJ SOLN: 0.2500 mg | INTRAMUSCULAR | Status: DC | PRN

## 2015-09-06 MED ORDER — DIPHENHYDRAMINE HCL 50 MG/ML IJ SOLN
INTRAMUSCULAR | Status: DC | PRN
  Administered 2015-09-06: 12.5 mg via INTRAVENOUS

## 2015-09-06 MED ORDER — LIDOCAINE HCL (CARDIAC) 20 MG/ML IV SOLN
INTRAVENOUS | Status: DC | PRN
  Administered 2015-09-06: 80 mg via INTRAVENOUS

## 2015-09-06 MED ORDER — VANCOMYCIN HCL 10 G IV SOLR: 1500.0000 mg | INTRAVENOUS | Status: DC

## 2015-09-06 MED ORDER — CHLORHEXIDINE GLUCONATE 4 % EX LIQD: 1.0000 "application " | Freq: Once | CUTANEOUS | Status: DC

## 2015-09-06 MED ORDER — ONDANSETRON HCL 4 MG/2ML IJ SOLN
INTRAMUSCULAR | Status: DC | PRN
  Administered 2015-09-06: 4 mg via INTRAVENOUS

## 2015-09-06 MED ORDER — FENTANYL CITRATE (PF) 100 MCG/2ML IJ SOLN
INTRAMUSCULAR | Status: AC
  Filled 2015-09-06: qty 2

## 2015-09-06 MED ORDER — DEXAMETHASONE SODIUM PHOSPHATE 4 MG/ML IJ SOLN
INTRAMUSCULAR | Status: DC | PRN
  Administered 2015-09-06: 10 mg via INTRAVENOUS

## 2015-09-06 MED ORDER — MIDAZOLAM HCL 2 MG/2ML IJ SOLN
INTRAMUSCULAR | Status: AC
  Filled 2015-09-06: qty 2

## 2015-09-06 MED ORDER — SODIUM CHLORIDE 0.9 % IV SOLN: INTRAVENOUS | Status: DC

## 2015-09-06 MED ORDER — FENTANYL CITRATE (PF) 100 MCG/2ML IJ SOLN: 25.0000 ug | INTRAMUSCULAR | Status: DC | PRN

## 2015-09-06 MED ORDER — SCOPOLAMINE 1 MG/3DAYS TD PT72: 1.0000 | MEDICATED_PATCH | Freq: Once | TRANSDERMAL | Status: DC | PRN

## 2015-09-06 MED ORDER — VANCOMYCIN HCL IN DEXTROSE 500-5 MG/100ML-% IV SOLN
INTRAVENOUS | Status: AC
  Filled 2015-09-06: qty 100

## 2015-09-06 MED ORDER — GLYCOPYRROLATE 0.2 MG/ML IJ SOLN: 0.2000 mg | Freq: Once | INTRAMUSCULAR | Status: DC | PRN

## 2015-09-06 MED ORDER — OXYCODONE HCL 5 MG PO TABS: 5.0000 mg | ORAL_TABLET | Freq: Once | ORAL | Status: DC | PRN

## 2015-09-06 MED ORDER — PROPOFOL 10 MG/ML IV BOLUS
INTRAVENOUS | Status: DC | PRN
  Administered 2015-09-06: 250 mg via INTRAVENOUS

## 2015-09-06 MED ORDER — ACETAMINOPHEN 650 MG RE SUPP: 650.0000 mg | RECTAL | Status: DC | PRN

## 2015-09-06 MED ORDER — MIDAZOLAM HCL 2 MG/2ML IJ SOLN
1.0000 mg | INTRAMUSCULAR | Status: DC | PRN
  Administered 2015-09-06: 2 mg via INTRAVENOUS

## 2015-09-06 MED ORDER — BUPIVACAINE-EPINEPHRINE (PF) 0.5% -1:200000 IJ SOLN
INTRAMUSCULAR | Status: DC | PRN
  Administered 2015-09-06: 10 mL

## 2015-09-06 MED ORDER — SODIUM CHLORIDE 0.9% FLUSH: 3.0000 mL | INTRAVENOUS | Status: DC | PRN

## 2015-09-06 MED ORDER — OXYCODONE HCL 5 MG/5ML PO SOLN: 5.0000 mg | Freq: Once | ORAL | Status: DC | PRN

## 2015-09-06 MED ORDER — BUPIVACAINE-EPINEPHRINE (PF) 0.5% -1:200000 IJ SOLN
INTRAMUSCULAR | Status: AC
  Filled 2015-09-06: qty 30

## 2015-09-06 MED ORDER — SODIUM CHLORIDE 0.9 % IV SOLN: 250.0000 mL | INTRAVENOUS | Status: DC | PRN

## 2015-09-06 MED ORDER — MEPERIDINE HCL 25 MG/ML IJ SOLN: 6.2500 mg | INTRAMUSCULAR | Status: DC | PRN

## 2015-09-06 MED ORDER — OXYCODONE HCL 5 MG PO TABS: 5.0000 mg | ORAL_TABLET | ORAL | Status: DC | PRN

## 2015-09-06 MED ORDER — VANCOMYCIN HCL IN DEXTROSE 1-5 GM/200ML-% IV SOLN
1000.0000 mg | INTRAVENOUS | Status: AC
  Administered 2015-09-06: 1000 mg via INTRAVENOUS

## 2015-09-06 MED ORDER — LACTATED RINGERS IV SOLN
INTRAVENOUS | Status: DC
  Administered 2015-09-06 (×2): via INTRAVENOUS

## 2015-09-06 MED ORDER — SODIUM CHLORIDE 0.9% FLUSH: 3.0000 mL | Freq: Two times a day (BID) | INTRAVENOUS | Status: DC

## 2015-09-06 MED ORDER — FENTANYL CITRATE (PF) 100 MCG/2ML IJ SOLN
50.0000 ug | INTRAMUSCULAR | Status: DC | PRN
  Administered 2015-09-06: 100 ug via INTRAVENOUS

## 2015-09-06 MED ORDER — VANCOMYCIN HCL IN DEXTROSE 1-5 GM/200ML-% IV SOLN
INTRAVENOUS | Status: AC
  Filled 2015-09-06: qty 200

## 2015-09-06 SURGICAL SUPPLY — 62 items
APPLIER CLIP 9.375 MED OPEN (MISCELLANEOUS)
BENZOIN TINCTURE PRP APPL 2/3 (GAUZE/BANDAGES/DRESSINGS) IMPLANT
BINDER BREAST LRG (GAUZE/BANDAGES/DRESSINGS) IMPLANT
BINDER BREAST MEDIUM (GAUZE/BANDAGES/DRESSINGS) IMPLANT
BINDER BREAST XLRG (GAUZE/BANDAGES/DRESSINGS) ×3 IMPLANT
BINDER BREAST XXLRG (GAUZE/BANDAGES/DRESSINGS) IMPLANT
BLADE HEX COATED 2.75 (ELECTRODE) ×3 IMPLANT
BLADE SURG 10 STRL SS (BLADE) IMPLANT
BLADE SURG 15 STRL LF DISP TIS (BLADE) ×1 IMPLANT
BLADE SURG 15 STRL SS (BLADE) ×2
CANISTER SUC SOCK COL 7IN (MISCELLANEOUS) IMPLANT
CANISTER SUCT 1200ML W/VALVE (MISCELLANEOUS) ×3 IMPLANT
CHLORAPREP W/TINT 26ML (MISCELLANEOUS) ×3 IMPLANT
CLIP APPLIE 9.375 MED OPEN (MISCELLANEOUS) IMPLANT
CLOSURE WOUND 1/2 X4 (GAUZE/BANDAGES/DRESSINGS) ×1
COVER BACK TABLE 60X90IN (DRAPES) ×3 IMPLANT
COVER MAYO STAND STRL (DRAPES) ×3 IMPLANT
COVER PROBE W GEL 5X96 (DRAPES) ×3 IMPLANT
DECANTER SPIKE VIAL GLASS SM (MISCELLANEOUS) IMPLANT
DERMABOND ADVANCED (GAUZE/BANDAGES/DRESSINGS) ×2
DERMABOND ADVANCED .7 DNX12 (GAUZE/BANDAGES/DRESSINGS) ×1 IMPLANT
DEVICE DUBIN W/COMP PLATE 8390 (MISCELLANEOUS) ×3 IMPLANT
DRAPE LAPAROSCOPIC ABDOMINAL (DRAPES) ×3 IMPLANT
DRAPE UTILITY XL STRL (DRAPES) ×3 IMPLANT
DRSG PAD ABDOMINAL 8X10 ST (GAUZE/BANDAGES/DRESSINGS) IMPLANT
ELECT REM PT RETURN 9FT ADLT (ELECTROSURGICAL) ×3
ELECTRODE REM PT RTRN 9FT ADLT (ELECTROSURGICAL) ×1 IMPLANT
GLOVE BIO SURGEON STRL SZ 6.5 (GLOVE) ×2 IMPLANT
GLOVE BIO SURGEONS STRL SZ 6.5 (GLOVE) ×1
GLOVE BIOGEL PI IND STRL 7.0 (GLOVE) ×1 IMPLANT
GLOVE BIOGEL PI INDICATOR 7.0 (GLOVE) ×2
GLOVE EUDERMIC 7 POWDERFREE (GLOVE) ×3 IMPLANT
GLOVE EXAM NITRILE EXT CUFF MD (GLOVE) ×3 IMPLANT
GOWN STRL REUS W/ TWL LRG LVL3 (GOWN DISPOSABLE) ×1 IMPLANT
GOWN STRL REUS W/ TWL XL LVL3 (GOWN DISPOSABLE) ×1 IMPLANT
GOWN STRL REUS W/TWL LRG LVL3 (GOWN DISPOSABLE) ×2
GOWN STRL REUS W/TWL XL LVL3 (GOWN DISPOSABLE) ×2
KIT MARKER MARGIN INK (KITS) ×3 IMPLANT
NEEDLE HYPO 25X1 1.5 SAFETY (NEEDLE) ×3 IMPLANT
NS IRRIG 1000ML POUR BTL (IV SOLUTION) ×3 IMPLANT
PACK BASIN DAY SURGERY FS (CUSTOM PROCEDURE TRAY) ×3 IMPLANT
PENCIL BUTTON HOLSTER BLD 10FT (ELECTRODE) ×3 IMPLANT
SHEET MEDIUM DRAPE 40X70 STRL (DRAPES) IMPLANT
SLEEVE SCD COMPRESS KNEE MED (MISCELLANEOUS) ×3 IMPLANT
SPONGE GAUZE 4X4 12PLY STER LF (GAUZE/BANDAGES/DRESSINGS) IMPLANT
SPONGE LAP 18X18 X RAY DECT (DISPOSABLE) IMPLANT
SPONGE LAP 4X18 X RAY DECT (DISPOSABLE) ×3 IMPLANT
STRIP CLOSURE SKIN 1/2X4 (GAUZE/BANDAGES/DRESSINGS) ×2 IMPLANT
SUT ETHILON 3 0 FSL (SUTURE) IMPLANT
SUT MNCRL AB 4-0 PS2 18 (SUTURE) ×3 IMPLANT
SUT SILK 2 0 SH (SUTURE) ×3 IMPLANT
SUT VIC AB 2-0 CT1 27 (SUTURE)
SUT VIC AB 2-0 CT1 TAPERPNT 27 (SUTURE) IMPLANT
SUT VIC AB 3-0 SH 27 (SUTURE)
SUT VIC AB 3-0 SH 27X BRD (SUTURE) IMPLANT
SUT VICRYL 3-0 CR8 SH (SUTURE) ×3 IMPLANT
SYRINGE 10CC LL (SYRINGE) ×3 IMPLANT
TOWEL OR 17X24 6PK STRL BLUE (TOWEL DISPOSABLE) ×3 IMPLANT
TOWEL OR NON WOVEN STRL DISP B (DISPOSABLE) IMPLANT
TUBE CONNECTING 20'X1/4 (TUBING) ×1
TUBE CONNECTING 20X1/4 (TUBING) ×2 IMPLANT
YANKAUER SUCT BULB TIP NO VENT (SUCTIONS) ×3 IMPLANT

## 2015-09-06 NOTE — Anesthesia Procedure Notes (Signed)
Procedure Name: LMA Insertion Date/Time: 09/06/2015 11:38 AM Performed by: Baxter Flattery Pre-anesthesia Checklist: Patient identified, Emergency Drugs available, Suction available and Patient being monitored Patient Re-evaluated:Patient Re-evaluated prior to inductionOxygen Delivery Method: Circle System Utilized Preoxygenation: Pre-oxygenation with 100% oxygen Intubation Type: IV induction Ventilation: Mask ventilation without difficulty LMA: LMA inserted LMA Size: 4.0 Number of attempts: 1 Airway Equipment and Method: Bite block Placement Confirmation: positive ETCO2 and breath sounds checked- equal and bilateral Tube secured with: Tape Dental Injury: Teeth and Oropharynx as per pre-operative assessment

## 2015-09-06 NOTE — Anesthesia Postprocedure Evaluation (Signed)
Anesthesia Post Note  Patient: Bonnie Brown  Procedure(s) Performed: Procedure(s) (LRB): BREAST LUMPECTOMY WITH RADIOACTIVE SEED LOCALIZATION (Left)  Patient location during evaluation: PACU Anesthesia Type: General Level of consciousness: awake and alert Pain management: pain level controlled Vital Signs Assessment: post-procedure vital signs reviewed and stable Respiratory status: spontaneous breathing, nonlabored ventilation and respiratory function stable Cardiovascular status: blood pressure returned to baseline and stable Postop Assessment: no signs of nausea or vomiting Anesthetic complications: no    Last Vitals:  Filed Vitals:   09/06/15 1330 09/06/15 1343  BP: 142/89 129/89  Pulse: 71 72  Temp:  36.4 C  Resp: 13 18    Last Pain:  Filed Vitals:   09/06/15 1345  PainSc: 3                  Caedmon Louque A

## 2015-09-06 NOTE — Interval H&P Note (Signed)
History and Physical Interval Note:  09/06/2015 11:18 AM  Bonnie Brown  has presented today for surgery, with the diagnosis of atypical hyperplasia left breast  The various methods of treatment have been discussed with the patient and family. After consideration of risks, benefits and other options for treatment, the patient has consented to  Procedure(s): BREAST LUMPECTOMY WITH RADIOACTIVE SEED LOCALIZATION (Left) as a surgical intervention .  The patient's history has been reviewed, patient examined, no change in status, stable for surgery.  I have reviewed the patient's chart and labs.  Questions were answered to the patient's satisfaction.     [name]

## 2015-09-06 NOTE — Anesthesia Preprocedure Evaluation (Signed)
Anesthesia Evaluation  Patient identified by MRN, date of birth, ID band Patient awake    Reviewed: Allergy & Precautions, NPO status , Patient's Chart, lab work & pertinent test results  Airway Mallampati: II  TM Distance: >3 FB Neck ROM: Full    Dental  (+) Teeth Intact, Dental Advisory Given   Pulmonary    breath sounds clear to auscultation       Cardiovascular hypertension, Pt. on medications  Rhythm:Regular Rate:Normal     Neuro/Psych    GI/Hepatic   Endo/Other  Morbid obesity  Renal/GU      Musculoskeletal   Abdominal   Peds  Hematology   Anesthesia Other Findings   Reproductive/Obstetrics                             Anesthesia Physical Anesthesia Plan  ASA: III  Anesthesia Plan: General   Post-op Pain Management:    Induction: Intravenous  Airway Management Planned: LMA  Additional Equipment:   Intra-op Plan:   Post-operative Plan: Extubation in OR  Informed Consent: I have reviewed the patients History and Physical, chart, labs and discussed the procedure including the risks, benefits and alternatives for the proposed anesthesia with the patient or authorized representative who has indicated his/her understanding and acceptance.   Dental advisory given  Plan Discussed with: CRNA, Anesthesiologist and Surgeon  Anesthesia Plan Comments:         Anesthesia Quick Evaluation

## 2015-09-06 NOTE — Transfer of Care (Signed)
Immediate Anesthesia Transfer of Care Note  Patient: Bonnie Brown  Procedure(s) Performed: Procedure(s): BREAST LUMPECTOMY WITH RADIOACTIVE SEED LOCALIZATION (Left)  Patient Location: PACU  Anesthesia Type:General  Level of Consciousness: awake, sedated and responds to stimulation  Airway & Oxygen Therapy: Patient Spontanous Breathing and Patient connected to face mask oxygen  Post-op Assessment: Report given to RN, Post -op Vital signs reviewed and stable and Patient moving all extremities  Post vital signs: Reviewed and stable  Last Vitals:  Filed Vitals:   09/06/15 1010  BP: 128/77  Pulse: 89  Temp: 36.7 C  Resp: 20    Complications: No apparent anesthesia complications

## 2015-09-06 NOTE — Op Note (Signed)
Patient Name:           Bonnie Brown   Date of Surgery:        09/06/2015  Pre op Diagnosis:      Atypical ductal hyperplasia and LCIS left breast  Post op Diagnosis:    Same  Procedure:                 Left breast lumpectomy with radioactive seed localization and margin assessment  Surgeon:                     Edsel Petrin. Dalbert Batman, M.D., FACS  Assistant:                      OR staff  Operative Indications:    This is a 35 year old Caucasian female, referred to me by Dr. Margarette Canada at the breast center of [facility] for evaluation of atypical ductal hyperplasia and LCIS left breast, lower inner quadrant. Derrek Monaco, NP in [place] provides primary care.     This patient has a history of mantle radiation for Hodgkin's disease in 1992. She's never had a breast problem before. She thought she felt some lumpiness in the left breast at the 12 o'clock position and went for imaging studies. Mammogram and ultrasound did not show anything abnormal to 12 o'clock position, but there was a 5.5 cm band of tight heterogeneous calcifications within the lower inner quadrant of the left breast. No mass effect was noted. Suspicious for DCIS. Image guided biopsy showed atypical ductal hyperplasia with calcifications and lobular neoplasia, lobular carcinoma in situ. She was referred.      Family history is negative for breast or ovarian cancer. A maternal grandmother had pancreatic cancer.      Comorbidities include the history of Hodgkin's disease with mantle radiation 1992. Total thyroidectomy for benign multinodular goiter at [facility] now on Synthroid. 2 cesarean sections. Benign hypertension      She was advised to undergo  RSL lumpectomy because of the atypical hyperplasia and her history of mantle radiation.  She is brought to the operating room electively.   Operative Findings:       The marker clip and seed were seen in the lower inner left  breast on the preop mammogram.  I was able to hear the audible signal of the radioactive seed with the neoprobe in the holding area.  The specimen mammogram looked good, containing the radioactive seed and the marker clip and there appeared to be a decent margin.  Procedure in Detail:          Following the induction of general LMA anesthesia the patient's left breast was prepped and draped in a sterile fashion.  Surgical timeout was performed.  Intravenous antibiotics were given.  0.5% Marcaine with epinephrine was used as local infiltration anesthetic.     Using the neoprobe I determined that the maximum radioactive signal was near the areolar margin at about the 8:00 position and so I chose to make a circumareolar incision at the areolar margin.  I took the dissection medially and posteriorly using the neoprobe as a guide.  The specimen was removed and marked with silk sutures and a 6 color ink kit to orient the pathologist.  Specimen mammogram looked good as described above.  The specimen was sent to the lab after marking it with pins.  Hemostasis was excellent and achieved with electrocautery.  The wound was irrigated with  saline and the breast tissue layers were closed with interrupted 3-0 Vicryl and skin closed with running subcuticular 4-0 Monocryl and Dermabond.  Breast binder was placed in the patient taken to PACU in stable condition.  EBL 10 mL.  Counts correct.  Complications none     Romelle Muldoon M. Dalbert Batman, M.D., FACS General and Minimally Invasive Surgery Breast and Colorectal Surgery  09/06/2015 12:27 PM

## 2015-09-06 NOTE — Discharge Instructions (Signed)
[facility] °Office Phone Number [phone] ° °BREAST BIOPSY/ PARTIAL MASTECTOMY: POST OP INSTRUCTIONS ° °Always review your discharge instruction sheet given to you by the facility where your surgery was performed. ° °IF YOU HAVE DISABILITY OR FAMILY LEAVE FORMS, YOU MUST BRING THEM TO THE OFFICE FOR PROCESSING.  DO NOT GIVE THEM TO YOUR DOCTOR. ° °1. A prescription for pain medication may be given to you upon discharge.  Take your pain medication as prescribed, if needed.  If narcotic pain medicine is not needed, then you may take acetaminophen (Tylenol) or ibuprofen (Advil) as needed. °2. Take your usually prescribed medications unless otherwise directed °3. If you need a refill on your pain medication, please contact your pharmacy.  They will contact our office to request authorization.  Prescriptions will not be filled after 5pm or on week-ends. °4. You should eat very light the first 24 hours after surgery, such as soup, crackers, pudding, etc.  Resume your normal diet the day after surgery. °5. Most patients will experience some swelling and bruising in the breast.  Ice packs and a good support bra will help.  Swelling and bruising can take several days to resolve.  °6. It is common to experience some constipation if taking pain medication after surgery.  Increasing fluid intake and taking a stool softener will usually help or prevent this problem from occurring.  A mild laxative (Milk of Magnesia or Miralax) should be taken according to package directions if there are no bowel movements after 48 hours. °7. Unless discharge instructions indicate otherwise, you may remove your bandages 24-48 hours after surgery, and you may shower at that time.  You may have steri-strips (small skin tapes) in place directly over the incision.  These strips should be left on the skin for 7-10 days.  If your surgeon used skin glue on the incision, you may shower in 24 hours.  The glue will flake off over the  next 2-3 weeks.  Any sutures or staples will be removed at the office during your follow-up visit. °8. ACTIVITIES:  You may resume regular daily activities (gradually increasing) beginning the next day.  Wearing a good support bra or sports bra minimizes pain and swelling.  You may have sexual intercourse when it is comfortable. °a. You may drive when you no longer are taking prescription pain medication, you can comfortably wear a seatbelt, and you can safely maneuver your car and apply brakes. °b. RETURN TO WORK:  ______________________________________________________________________________________ °9. You should see your doctor in the office for a follow-up appointment approximately two weeks after your surgery.  Your doctor’s nurse will typically make your follow-up appointment when she calls you with your pathology report.  Expect your pathology report 2-3 business days after your surgery.  You may call to check if you do not hear from us after three days. °10. OTHER INSTRUCTIONS: _______________________________________________________________________________________________ _____________________________________________________________________________________________________________________________________ °_____________________________________________________________________________________________________________________________________ °_____________________________________________________________________________________________________________________________________ ° °WHEN TO CALL YOUR DOCTOR: °1. Fever over 101.0 °2. Nausea and/or vomiting. °3. Extreme swelling or bruising. °4. Continued bleeding from incision. °5. Increased pain, redness, or drainage from the incision. ° °The clinic staff is available to answer your questions during regular business hours.  Please don’t hesitate to call and ask to speak to one of the nurses for clinical concerns.  If you have a medical emergency, go to the nearest  emergency room or call 911.  A surgeon from [facility] is always on call at the hospital. ° °For further questions, please visit [email or web address]  ° ° ° °  Post Anesthesia Home Care Instructions ° °Activity: °Get plenty of rest for the remainder of the day. A responsible adult should stay with you for 24 hours following the procedure.  °For the next 24 hours, DO NOT: °-Drive a car °-Operate machinery °-Drink alcoholic beverages °-Take any medication unless instructed by your physician °-Make any legal decisions or sign important papers. ° °Meals: °Start with liquid foods such as gelatin or soup. Progress to regular foods as tolerated. Avoid greasy, spicy, heavy foods. If nausea and/or vomiting occur, drink only clear liquids until the nausea and/or vomiting subsides. Call your physician if vomiting continues. ° °Special Instructions/Symptoms: °Your throat may feel dry or sore from the anesthesia or the breathing tube placed in your throat during surgery. If this causes discomfort, gargle with warm salt water. The discomfort should disappear within 24 hours. ° °If you had a scopolamine patch placed behind your ear for the management of post- operative nausea and/or vomiting: ° °1. The medication in the patch is effective for 72 hours, after which it should be removed.  Wrap patch in a tissue and discard in the trash. Wash hands thoroughly with soap and water. °2. You may remove the patch earlier than 72 hours if you experience unpleasant side effects which may include dry mouth, dizziness or visual disturbances. °3. Avoid touching the patch. Wash your hands with soap and water after contact with the patch. °  ° °

## 2015-09-07 ENCOUNTER — Encounter (HOSPITAL_BASED_OUTPATIENT_CLINIC_OR_DEPARTMENT_OTHER): Payer: Self-pay | Admitting: General Surgery

## 2015-09-13 ENCOUNTER — Encounter: Payer: Self-pay | Admitting: Hematology and Oncology

## 2015-09-13 ENCOUNTER — Telehealth: Payer: Self-pay | Admitting: Radiation Oncology

## 2015-09-13 ENCOUNTER — Encounter: Payer: Self-pay | Admitting: *Deleted

## 2015-09-13 ENCOUNTER — Telehealth: Payer: Self-pay | Admitting: Hematology and Oncology

## 2015-09-13 ENCOUNTER — Ambulatory Visit (HOSPITAL_BASED_OUTPATIENT_CLINIC_OR_DEPARTMENT_OTHER): Payer: PRIVATE HEALTH INSURANCE | Admitting: Hematology and Oncology

## 2015-09-13 VITALS — BP 119/80 | HR 92 | Temp 97.9°F | Resp 18 | Ht 64.0 in | Wt 185.9 lb

## 2015-09-13 DIAGNOSIS — D0512 Intraductal carcinoma in situ of left breast: Secondary | ICD-10-CM

## 2015-09-13 DIAGNOSIS — C50312 Malignant neoplasm of lower-inner quadrant of left female breast: Secondary | ICD-10-CM | POA: Insufficient documentation

## 2015-09-13 NOTE — Telephone Encounter (Signed)
Spoke with pt. who advised she would rather be seen in [place], i forwarded through fax the referral to [facility] and got a confirmation page, 09/13/15 Ardeen Fillers)

## 2015-09-13 NOTE — Telephone Encounter (Signed)
New breast appt-s/w patient and gave np appt for 03/07 @ 3:45 w/Dr. Lindi Adie.  Referring Dr. Fanny Skates   Referral information scanned.

## 2015-09-13 NOTE — Progress Notes (Signed)
[facility] [facility] NOTE  Patient Care Team: Arsenio Katz, NP as PCP - General (Nurse Practitioner)  CHIEF COMPLAINTS/PURPOSE OF CONSULTATION:  Newly diagnosed breast cancer  HISTORY OF PRESENTING ILLNESS:  Bonnie Brown 35 y.o. female is here because of recent diagnosis of left breast DCIS. It had a discomfort in the left breast and subsequently underwent a mammogram that revealed heterogeneous calcifications measuring 5.5 cm in size. She subsequently underwent a biopsy which revealed atypical ductal hyperplasia. Because of the suspicion of DCIS she underwent a lumpectomy on 08/29/2015 with Dr. Tressa Busman. Final pathology revealed low-grade DCIS without any necrosis, low-grade. She was sent was for discussion regarding adjuvant treatment options. She is accompanied today by her husband.  She had a previous diagnosis of Hodgkin lymphoma for which she underwent chemotherapy followed by mantle radiation at the age of 73. She has been in remission from that. She underwent a thyroidectomy recently as well. I reviewed her records extensively and collaborated the history with the patient.  SUMMARY OF ONCOLOGIC HISTORY:   Breast cancer of lower-inner quadrant of left female breast ([place])   07/26/2015 Mammogram Grouped faint segmental heterogeneous calcifications spanning 5.5 cm   08/16/2015 Initial Diagnosis Left breast biopsy: Atypical ductal hyperplasia with associated calcifications, LCIS   09/06/2015 Surgery Left lumpectomy: Papillary DCIS 2 cm in size, ER 95%, PR 70% with LCIS, DCIS focally 0.1 cm from inferior margin, LCIS focally at lateral margin, Tis N0 stage 0   MEDICAL HISTORY:  Past Medical History  Diagnosis Date  . Hypothyroidism   . History of blood transfusion 1995  . History of chemotherapy age 70  . Migraines   . History of hodgkin's lymphoma age 28  . Hypertension     under control with med., has been on med. since 07/2015  . Irregular heartbeat     states due to  radiation treatments - no med., has not seen cardiology since 2013  . Atypical hyperplasia of left breast 08/2015    SURGICAL HISTORY: Past Surgical History  Procedure Laterality Date  . Lymph node biopsy  1992  . Cesarean section N/A 11/04/2012    Procedure: Primary cesarean section with delivery of baby boy at 2007. apgars 8/9.;  Surgeon: Osborne Oman, MD;  Location: [facility];  Service: Obstetrics;  Laterality: N/A;  . Wisdom tooth extraction    . Cesarean section N/A 07/30/2014    Procedure: REPEAT CESAREAN SECTION;  Surgeon: Jonnie Kind, MD;  Location: [facility];  Service: Obstetrics;  Laterality: N/A;  . Total thyroidectomy  05/17/2015  . Breast lumpectomy with radioactive seed localization Left 09/06/2015    Procedure: BREAST LUMPECTOMY WITH RADIOACTIVE SEED LOCALIZATION;  Surgeon: Fanny Skates, MD;  Location: [facility];  Service: General;  Laterality: Left;    SOCIAL HISTORY: Social History   Social History  . Marital Status: Married    Spouse Name: N/A  . Number of Children: N/A  . Years of Education: N/A   Occupational History  . Not on file.   Social History Main Topics  . Smoking status: Never Smoker   . Smokeless tobacco: Never Used  . Alcohol Use: No  . Drug Use: No  . Sexual Activity: Yes    Birth Control/ Protection: None   Other Topics Concern  . Not on file   Social History Narrative    FAMILY HISTORY: Family History  Problem Relation Age of Onset  . Coronary artery disease Paternal Grandfather   . Diabetes Maternal  Grandmother   . Stroke Maternal Grandmother   . Coronary artery disease Maternal Grandmother   . Cancer Maternal Grandmother     pancreatic  . Coronary artery disease Father   . Hypertension Brother   . Hypertension Mother     ALLERGIES:  is allergic to seldane; adhesive; codeine; and penicillins.  MEDICATIONS:  Current Outpatient Prescriptions  Medication Sig Dispense Refill  . hydrochlorothiazide (MICROZIDE)  12.5 MG capsule Take 1 capsule (12.5 mg total) by mouth daily. 30 capsule 1  . HYDROcodone-acetaminophen (NORCO) 5-325 MG tablet Take 1-2 tablets by mouth every 6 (six) hours as needed for moderate pain or severe pain. 30 tablet 0  . levothyroxine (SYNTHROID, LEVOTHROID) 125 MCG tablet TAKE 1 TABLET BY MOUTH DAILY 30 tablet 2  . norethindrone (MICRONOR,CAMILA,ERRIN) 0.35 MG tablet Take 1 tablet (0.35 mg total) by mouth daily. 1 Package 11   No current facility-administered medications for this visit.    REVIEW OF SYSTEMS:   Constitutional: Denies fevers, chills or abnormal night sweats Eyes: Denies blurriness of vision, double vision or watery eyes Ears, nose, mouth, throat, and face: Denies mucositis or sore throat Respiratory: Denies cough, dyspnea or wheezes Cardiovascular: Denies palpitation, chest discomfort or lower extremity swelling Gastrointestinal:  Denies nausea, heartburn or change in bowel habits Skin: Denies abnormal skin rashes Lymphatics: Denies new lymphadenopathy or easy bruising Neurological:Denies numbness, tingling or new weaknesses Behavioral/Psych: Mood is stable, no new changes  Breast: No pain from recent surgery. All other systems were reviewed with the patient and are negative.  PHYSICAL EXAMINATION: ECOG PERFORMANCE STATUS: 1 - Symptomatic but completely ambulatory  Filed Vitals:   09/13/15 1531  BP: 119/80  Pulse: 92  Temp: 97.9 F (36.6 C)  Resp: 18   Filed Weights   09/13/15 1531  Weight: 185 lb 14.4 oz (84.324 kg)    GENERAL:alert, no distress and comfortable SKIN: skin color, texture, turgor are normal, no rashes or significant lesions EYES: normal, conjunctiva are pink and non-injected, sclera clear OROPHARYNX:no exudate, no erythema and lips, buccal mucosa, and tongue normal  NECK: supple, thyroid normal size, non-tender, without nodularity LYMPH:  no palpable lymphadenopathy in the cervical, axillary or inguinal LUNGS: clear to  auscultation and percussion with normal breathing effort HEART: regular rate & rhythm and no murmurs and no lower extremity edema ABDOMEN:abdomen soft, non-tender and normal bowel sounds Musculoskeletal:no cyanosis of digits and no clubbing  PSYCH: alert & oriented x 3 with fluent speech NEURO: no focal motor/sensory deficits   LABORATORY DATA:  I have reviewed the data as listed Lab Results  Component Value Date   WBC 8.2 09/01/2015   HGB 13.4 09/01/2015   HCT 41.0 09/01/2015   MCV 89.9 09/01/2015   PLT 403* 09/01/2015   Lab Results  Component Value Date   NA 140 09/01/2015   K 3.6 09/01/2015   CL 106 09/01/2015   CO2 26 09/01/2015    RADIOGRAPHIC STUDIES: I have personally reviewed the radiological reports and agreed with the findings in the report.  ASSESSMENT AND PLAN:  Breast cancer of lower-inner quadrant of left female breast ([employer]) Left lumpectomy 09/06/2015: Papillary DCIS 2 cm in size, ER 95%, PR 70% with LCIS, DCIS focally 0.1 cm from inferior margin, LCIS focally at lateral margin, Tis N0 stage 0 Prior history of Hodgkin's lymphoma with spleen involvement at age 42 status post chemotherapy followed by mantle radiation  Pathology review: I discussed with the patient the difference between DCIS and invasive breast cancer. It is  considered a precancerous lesion. DCIS is classified as a 0. It is generally detected through mammograms as calcifications. We discussed the significance of grades and its impact on prognosis. We also discussed the importance of ER and PR receptors and their implications to adjuvant treatment options. Prognosis of DCIS dependence on grade, comedo necrosis. It is anticipated that if not treated, 20-30% of DCIS can develop into invasive breast cancer.  Recommendation: 1. Tumor board presentation regarding the margin issue. 2. Followed by adjuvant radiation therapy (if the patient is able to receive in spite of her prior Hodgkin's lymphoma mantle  radiation) 3. Followed by antiestrogen therapy with tamoxifen 5 years  Tamoxifen counseling: We discussed the risks and benefits of tamoxifen. These include but not limited to insomnia, hot flashes, mood changes, vaginal dryness, and weight gain. Although rare, serious side effects including endometrial cancer, risk of blood clots were also discussed. We strongly believe that the benefits far outweigh the risks. Patient understands these risks and consented to starting treatment. Planned treatment duration is 5 years.  If the patient cannot receive radiation, then I would strongly urge her to take tamoxifen therapy. If the patient can receive radiation then tamoxifen would be considered optional.  [facility] Nomogram for DCIS: Relates a 10 year risk of recurrence at 24% with no further treatment. With combination of radiation and antiestrogen therapy for risk was 5%, with radiation alone the risk was a 10%, with antiestrogen therapy alone the risk was 12% over 10 years  I instructed her that we will call her after the tumor board presentation next week to make up the final decision regarding whether she needs additional surgery. If she cannot get radiation then she may need to choose to undergo mastectomy (if she needs additional surgery).  Return to clinic after she completes radiation therapy.    All questions were answered. The patient knows to call the clinic with any problems, questions or concerns.    Rulon Eisenmenger, MD 09/13/2015

## 2015-09-13 NOTE — Assessment & Plan Note (Signed)
Left lumpectomy 09/06/2015: Papillary DCIS 2 cm in size, ER 95%, PR 70% with LCIS, DCIS focally 0.1 cm from inferior margin, LCIS focally at lateral margin, Tis N0 stage 0 Prior history of Hodgkin's lymphoma with spleen involvement at age 35 status post chemotherapy followed by mantle radiation  Pathology review: I discussed with the patient the difference between DCIS and invasive breast cancer. It is considered a precancerous lesion. DCIS is classified as a 0. It is generally detected through mammograms as calcifications. We discussed the significance of grades and its impact on prognosis. We also discussed the importance of ER and PR receptors and their implications to adjuvant treatment options. Prognosis of DCIS dependence on grade, comedo necrosis. It is anticipated that if not treated, 20-30% of DCIS can develop into invasive breast cancer.  Recommendation: 1. Tumor board presentation regarding the margin issue. 2. Followed by adjuvant radiation therapy (if the patient is able to receive in spite of her prior Hodgkin's lymphoma mantle radiation) 3. Followed by antiestrogen therapy with tamoxifen 5 years  Tamoxifen counseling: We discussed the risks and benefits of tamoxifen. These include but not limited to insomnia, hot flashes, mood changes, vaginal dryness, and weight gain. Although rare, serious side effects including endometrial cancer, risk of blood clots were also discussed. We strongly believe that the benefits far outweigh the risks. Patient understands these risks and consented to starting treatment. Planned treatment duration is 5 years.  If the patient cannot receive radiation, then I would strongly urge her to take tamoxifen therapy. If the patient can receive radiation then tamoxifen would be considered optional.  [facility] Nomogram for DCIS: Relates a 10 year risk of recurrence at 24% with no further treatment. With combination of radiation and antiestrogen therapy for risk was 5%,  with radiation alone the risk was a 10%, with antiestrogen therapy alone the risk was 12% over 10 years  I instructed her that we will call her after the tumor board presentation next week to make up the final decision regarding whether she needs additional surgery. If she cannot get radiation then she may need to choose to undergo mastectomy (if she needs additional surgery).  Return to clinic after she completes radiation therapy.

## 2015-09-14 ENCOUNTER — Encounter: Payer: Self-pay | Admitting: Genetic Counselor

## 2015-09-14 ENCOUNTER — Ambulatory Visit (HOSPITAL_BASED_OUTPATIENT_CLINIC_OR_DEPARTMENT_OTHER): Payer: PRIVATE HEALTH INSURANCE | Admitting: Genetic Counselor

## 2015-09-14 ENCOUNTER — Other Ambulatory Visit: Payer: PRIVATE HEALTH INSURANCE

## 2015-09-14 DIAGNOSIS — Z8571 Personal history of Hodgkin lymphoma: Secondary | ICD-10-CM

## 2015-09-14 DIAGNOSIS — Z808 Family history of malignant neoplasm of other organs or systems: Secondary | ICD-10-CM

## 2015-09-14 DIAGNOSIS — C50312 Malignant neoplasm of lower-inner quadrant of left female breast: Secondary | ICD-10-CM | POA: Diagnosis not present

## 2015-09-14 DIAGNOSIS — Z315 Encounter for genetic counseling: Secondary | ICD-10-CM

## 2015-09-14 DIAGNOSIS — Z800 Family history of malignant neoplasm of digestive organs: Secondary | ICD-10-CM

## 2015-09-14 DIAGNOSIS — E039 Hypothyroidism, unspecified: Secondary | ICD-10-CM

## 2015-09-14 NOTE — Progress Notes (Signed)
REFERRING PROVIDER: Arsenio Katz, NP [place], [place] [address]   Bonnie Lose, Brown  PRIMARY PROVIDER:  Arsenio Katz, NP  PRIMARY REASON FOR VISIT:  1. Breast cancer of lower-inner quadrant of left female breast (Bonnie Brown)   2. History of Hodgkin's disease   3. Hypothyroidism, unspecified hypothyroidism type   4. Family history of colon cancer   5. Family history of pancreatic cancer      HISTORY OF PRESENT ILLNESS:   Bonnie Brown, a 35 y.o. female, was seen for a [facility] cancer genetics consultation at the request of Bonnie Brown due to a personal and family history of cancer.  Bonnie Brown presents to clinic today to discuss the possibility of a hereditary predisposition to cancer, genetic testing, and to further clarify her future cancer risks, as well as potential cancer risks for family members.   In 1994, at the age of 31, Bonnie Brown was diagnosed with Hodgkin's Lymphoma. This was treated with chemotherapy and radiation to her chest and spleen.  In 2017, at the age of 63, Bonnie Brown was diagnosed with  Left breast cancer.  She has had a lumpectomy, but because she may not be able to get more radiation based on her previous dx, she may go on to get a mastectomy and tamoxifen for the next 5 years.  When Bonnie Brown was in her 89s, she was diagnosed with hypothyroidism.  She recently had her thyroid out due to multiple nodules and they were found to be "pre-cancerous".    CANCER HISTORY:    Breast cancer of lower-inner quadrant of left female breast (Brown)   07/26/2015 Mammogram Grouped faint segmental heterogeneous calcifications spanning 5.5 cm   08/16/2015 Initial Diagnosis Left breast biopsy: Atypical ductal hyperplasia with associated calcifications, LCIS   09/06/2015 Surgery Left lumpectomy: Papillary DCIS 2 cm in size, ER 95%, PR 70% with LCIS, DCIS focally 0.1 cm from inferior margin, LCIS focally at lateral margin, Tis N0 stage 0     HORMONAL RISK FACTORS:  Menarche was at age  7.  First live birth at age 51.  OCP use for approximately 3 years.  Ovaries intact: yes.  Hysterectomy: no.  Menopausal status: premenopausal.  HRT use: 0 years. Colonoscopy: no; not examined. Mammogram within the last year: yes. Number of breast biopsies: 1. Up to date with pelvic exams:  yes. Any excessive radiation exposure in the past:  YEs, due to Hodgkin's lymphoma at age 41.  Past Medical History  Diagnosis Date  . Hypothyroidism   . History of blood transfusion 1995  . History of chemotherapy age 44  . Migraines   . History of hodgkin's lymphoma age 22  . Hypertension     under control with med., has been on med. since 07/2015  . Irregular heartbeat     states due to radiation treatments - no med., has not seen cardiology since 2013  . Atypical hyperplasia of left breast 08/2015  . Family history of colon cancer   . Family history of pancreatic cancer     Past Surgical History  Procedure Laterality Date  . Lymph node biopsy  1992  . Cesarean section N/A 11/04/2012    Procedure: Primary cesarean section with delivery of baby boy at 2007. apgars 8/9.;  Surgeon: Bonnie Brown;  Location: [facility];  Service: Obstetrics;  Laterality: N/A;  . Wisdom tooth extraction    . Cesarean section N/A 07/30/2014    Procedure: REPEAT CESAREAN SECTION;  Surgeon: Bonnie Reichmann  Bonnie Brown;  Location: [facility];  Service: Obstetrics;  Laterality: N/A;  . Total thyroidectomy  05/17/2015  . Breast lumpectomy with radioactive seed localization Left 09/06/2015    Procedure: BREAST LUMPECTOMY WITH RADIOACTIVE SEED LOCALIZATION;  Surgeon: Bonnie Skates, Brown;  Location: [facility];  Service: General;  Laterality: Left;    Social History   Social History  . Marital Status: Married    Spouse Name: N/A  . Number of Children: N/A  . Years of Education: N/A   Social History Main Topics  . Smoking status: Never Smoker   . Smokeless tobacco: Never Used  . Alcohol Use: No  . Drug  Use: No  . Sexual Activity: Yes    Birth Control/ Protection: None   Other Topics Concern  . None   Social History Narrative     FAMILY HISTORY:  We obtained a detailed, 4-generation family history.  Significant diagnoses are listed below: Family History  Problem Relation Age of Onset  . Coronary artery disease Paternal Grandfather   . Diabetes Maternal Grandmother   . Stroke Maternal Grandmother   . Coronary artery disease Maternal Grandmother   . Pancreatic cancer Maternal Grandmother     dx in her 1s  . Coronary artery disease Father   . Hypertension Brother   . Hypertension Mother   . Colon cancer Paternal Aunt     dx in her 17s-70s  . Colon cancer Cousin     maternal first cousin dx in her 108s  . Brain cancer Cousin 5    maternal first cousin's daughter    The patient has one son and one daughter who are healthy.  She has one brother and a sister who are healthy and cancer free. Her father died of a heart attack at age 24.  He had one full sister and three half sisters and a half brother.  One half sister died of colon cancer.  The patient's mother had five sisters and two brothers.  One brother had a daughter who had colon cancer in her 63s, and that daughter had a daughter who had a brain tumor at age 60.  The patient's maternal grandmother was diagnosed with pancreatic cancer in her 6s.  Patient's maternal ancestors are of Caucasian descent, and paternal ancestors are of Caucasian descent. There is no reported Ashkenazi Jewish ancestry. There is no known consanguinity.  GENETIC COUNSELING ASSESSMENT: Bonnie Brown is a 35 y.o. female with a personal history of Hodgkin's lymphoma, thyroid nodules and breast cancer and a family history of colon, pancreatic and brain cancer which is somewhat suggestive of a hereditary cancer syndrome and predisposition to cancer. We, therefore, discussed and recommended the following at today's visit.   DISCUSSION: We discussed that  about 5-10% of breast cancer is hereditary, with most cases the result of BRCA mutations.  In women under 35 with breast cancer, who are negative for BRCA mutations, about 4% have a TP53 mutation.  We also reviewed her family history of colon, pancreatic and brain cancer.  This can sometimes be indicative of Lynch syndrome.  We reviewed the characteristics, features and inheritance patterns of hereditary cancer syndromes. We also discussed genetic testing, including the appropriate family members to test, the process of testing, insurance coverage and turn-around-time for results. We discussed the implications of a negative, positive and/or variant of uncertain significant result. We recommended Bonnie Brown pursue genetic testing for the Comprehensive Cancer gene panel. The Comprehensive Cancer Panel offered by [facility]  includes sequencing and/or deletion duplication testing of the following 32 genes: APC, ATM, AXIN2, BARD1, BMPR1A, BRCA1, BRCA2, BRIP1, CDH1, CDK4, CDKN2A, CHEK2, EPCAM, FANCC, MLH1, MSH2, MSH6, MUTYH, NBN, PALB2, PMS2, POLD1, POLE, PTEN, RAD51C, RAD51D, SCG5/GREM1, SMAD4, STK11, TP53, VHL, and XRCC2.     Based on Bonnie Brown's personal and family history of cancer, she meets medical criteria for genetic testing. Despite that she meets criteria, she may still have an out of pocket cost. We discussed that if her out of pocket cost for testing is over $100, the laboratory will call and confirm whether she wants to proceed with testing.  If the out of pocket cost of testing is less than $100 she will be billed by the genetic testing laboratory.   PLAN: After considering the risks, benefits, and limitations, Bonnie Brown  provided informed consent to pursue genetic testing and the blood sample was sent to [facility] for analysis of the Comprehensive cancer panel. Results should be available within approximately 2-3 weeks' time, at which point they will be disclosed by telephone to Bonnie Brown, as  will any additional recommendations warranted by these results. Bonnie Brown will receive a summary of her genetic counseling visit and a copy of her results once available. This information will also be available in Epic. We encouraged Bonnie Brown to remain in contact with cancer genetics annually so that we can continuously update the family history and inform her of any changes in cancer genetics and testing that may be of benefit for her family. Bonnie Brown questions were answered to her satisfaction today. Our contact information was provided should additional questions or concerns arise.  Lastly, we encouraged Bonnie Brown to remain in contact with cancer genetics annually so that we can continuously update the family history and inform her of any changes in cancer genetics and testing that may be of benefit for this family.   Ms.  Brown questions were answered to her satisfaction today. Our contact information was provided should additional questions or concerns arise. Thank you for the referral and allowing [place] to share in the care of your patient.   Cattleya Dobratz P. Florene Bonnie, [place], [facility] Certified Genetic Counselor [email or web address] phone: [phone]  The patient was seen for a total of 30 minutes in face-to-face genetic counseling.  This patient was discussed with Drs. Magrinat, Bonnie Brown and/or Burr Medico who agrees with the above.    _______________________________________________________________________ For Office Staff:  Number of people involved in session: 1 Was an Intern/ student involved with case: yes

## 2015-09-21 ENCOUNTER — Ambulatory Visit: Payer: PRIVATE HEALTH INSURANCE

## 2015-09-21 ENCOUNTER — Ambulatory Visit
Admission: RE | Admit: 2015-09-21 | Payer: PRIVATE HEALTH INSURANCE | Source: Ambulatory Visit | Admitting: Radiation Oncology

## 2015-10-05 ENCOUNTER — Other Ambulatory Visit: Payer: Self-pay | Admitting: General Surgery

## 2015-10-05 DIAGNOSIS — C50312 Malignant neoplasm of lower-inner quadrant of left female breast: Secondary | ICD-10-CM

## 2015-10-12 ENCOUNTER — Ambulatory Visit
Admission: RE | Admit: 2015-10-12 | Discharge: 2015-10-12 | Disposition: A | Discharge status: Home / Self Care | Payer: PRIVATE HEALTH INSURANCE | Source: Ambulatory Visit | Attending: General Surgery | Admitting: General Surgery

## 2015-10-12 DIAGNOSIS — C50312 Malignant neoplasm of lower-inner quadrant of left female breast: Secondary | ICD-10-CM

## 2015-10-12 MED ORDER — GADOBENATE DIMEGLUMINE 529 MG/ML IV SOLN
17.0000 mL | Freq: Once | INTRAVENOUS | Status: AC | PRN
  Administered 2015-10-12: 17 mL via INTRAVENOUS

## 2015-10-14 ENCOUNTER — Other Ambulatory Visit: Payer: Self-pay | Admitting: General Surgery

## 2015-10-14 ENCOUNTER — Other Ambulatory Visit: Payer: Self-pay | Admitting: Adult Health

## 2015-10-14 DIAGNOSIS — N6310 Unspecified lump in the right breast, unspecified quadrant: Secondary | ICD-10-CM

## 2015-10-17 ENCOUNTER — Other Ambulatory Visit: Payer: Self-pay | Admitting: Adult Health

## 2015-10-18 ENCOUNTER — Ambulatory Visit
Admission: RE | Admit: 2015-10-18 | Discharge: 2015-10-18 | Disposition: A | Discharge status: Home / Self Care | Payer: PRIVATE HEALTH INSURANCE | Source: Ambulatory Visit | Attending: Adult Health | Admitting: Adult Health

## 2015-10-18 ENCOUNTER — Ambulatory Visit
Admission: RE | Admit: 2015-10-18 | Discharge: 2015-10-18 | Disposition: A | Discharge status: Home / Self Care | Payer: PRIVATE HEALTH INSURANCE | Source: Ambulatory Visit | Attending: General Surgery | Admitting: General Surgery

## 2015-10-18 DIAGNOSIS — N6310 Unspecified lump in the right breast, unspecified quadrant: Secondary | ICD-10-CM

## 2015-10-18 MED ORDER — GADOBENATE DIMEGLUMINE 529 MG/ML IV SOLN
17.0000 mL | Freq: Once | INTRAVENOUS | Status: AC | PRN
  Administered 2015-10-18: 17 mL via INTRAVENOUS

## 2015-10-28 ENCOUNTER — Other Ambulatory Visit: Payer: Self-pay | Admitting: General Surgery

## 2015-10-28 ENCOUNTER — Telehealth: Payer: Self-pay | Admitting: Genetic Counselor

## 2015-10-28 DIAGNOSIS — C50912 Malignant neoplasm of unspecified site of left female breast: Secondary | ICD-10-CM

## 2015-10-28 NOTE — Telephone Encounter (Signed)
Discussed with Bonnie Brown that her genetic test result was negative for any known pathogenic mutations within any of 32 genes on the Comprehensive Cancer Panel which would increase her genetic risk for breast, colon, ovarian, or other related cancers.  Discussed that one uncertain change was found in one copy of the Bonnie Brown gene.  Discussed that we treat this like a negative result until it gets updated by the lab and reviewed why we do that.  Encouraged her to keep her phone number up-to-date with [place], so we can call and let her know if that gets updated in the future.  Encouraged her to follow her doctors' cancer screening recommendations in the future.  Discussed that her sister, nieces, and daughter are at an increased risk for breast cancer based on her own history.  Her nieces and daughter can begin mammogram screening around the age of 34.  As her daughter is still very young, the best thing for her to do as she gets older would be to make her primary doctor aware of this cancer history, so that she can receive the most appropriate screening even as guidelines and technology change.  Bonnie Brown is welcome to call me with any questions.  Bonnie Brown will also be back on Monday, so she can call her with questions as well.

## 2015-11-01 ENCOUNTER — Ambulatory Visit: Payer: Self-pay | Admitting: Genetic Counselor

## 2015-11-01 ENCOUNTER — Encounter: Payer: Self-pay | Admitting: Genetic Counselor

## 2015-11-01 DIAGNOSIS — Z800 Family history of malignant neoplasm of digestive organs: Secondary | ICD-10-CM

## 2015-11-01 DIAGNOSIS — Z1379 Encounter for other screening for genetic and chromosomal anomalies: Secondary | ICD-10-CM | POA: Insufficient documentation

## 2015-11-01 DIAGNOSIS — C50312 Malignant neoplasm of lower-inner quadrant of left female breast: Secondary | ICD-10-CM

## 2015-11-01 NOTE — Progress Notes (Signed)
HPI: Ms. Garlitz was previously seen in the [facility] clinic due to a personal and family history of cancer and concerns regarding a hereditary predisposition to cancer. Please refer to our prior cancer genetics clinic note for more information regarding Ms. Buttacavoli's medical, social and family histories, and our assessment and recommendations, at the time. Ms. Middlesworth recent genetic test results were disclosed to her, as were recommendations warranted by these results. These results and recommendations are discussed in more detail below.  FAMILY HISTORY:  We obtained a detailed, 4-generation family history.  Significant diagnoses are listed below: Family History  Problem Relation Age of Onset  . Coronary artery disease Paternal Grandfather   . Diabetes Maternal Grandmother   . Stroke Maternal Grandmother   . Coronary artery disease Maternal Grandmother   . Pancreatic cancer Maternal Grandmother     dx in her 24s  . Coronary artery disease Father   . Hypertension Brother   . Hypertension Mother   . Colon cancer Paternal Aunt     dx in her 1s-70s  . Colon cancer Cousin     maternal first cousin dx in her 48s  . Brain cancer Cousin 5    maternal first cousin's daughter    The patient has one son and one daughter who are healthy. She has one brother and a sister who are healthy and cancer free. Her father died of a heart attack at age 68. He had one full sister and three half sisters and a half brother. One half sister died of colon cancer. The patient's mother had five sisters and two brothers. One brother had a daughter who had colon cancer in her 13s, and that daughter had a daughter who had a brain tumor at age 26. The patient's maternal grandmother was diagnosed with pancreatic cancer in her 22s. Patient's maternal ancestors are of Caucasian descent, and paternal ancestors are of Caucasian descent. There is no reported Ashkenazi Jewish ancestry. There is no known  consanguinity.  GENETIC TEST RESULTS: At the time of Ms. Melamed's visit, we recommended she pursue genetic testing of the Comprehensive cancer gene panel. The Comprehensive Cancer Panel offered by [facility] includes sequencing and/or deletion duplication testing of the following 32 genes: APC, ATM, AXIN2, BARD1, BMPR1A, BRCA1, BRCA2, BRIP1, CDH1, CDK4, CDKN2A, CHEK2, EPCAM, FANCC, MLH1, MSH2, MSH6, MUTYH, NBN, PALB2, PMS2, POLD1, POLE, PTEN, RAD51C, RAD51D, SCG5/GREM1, SMAD4, STK11, TP53, VHL, and XRCC2.   The report date is October 27, 2015.  Genetic testing was normal, and did not reveal a deleterious mutation in these genes. The test report has been scanned into EPIC and is located under the Molecular Pathology section of the Results Review tab.   We discussed with Ms. Skelton that since the current genetic testing is not perfect, it is possible there may be a gene mutation in one of these genes that current testing cannot detect, but that chance is small. We also discussed, that it is possible that another gene that has not yet been discovered, or that we have not yet tested, is responsible for the cancer diagnoses in the family, and it is, therefore, important to remain in touch with cancer genetics in the future so that we can continue to offer Ms. Varden the most up to date genetic testing.   Genetic testing did detect a Variant of Unknown Significance in the [facility] gene called c.1170C>G. At this time, it is unknown if this variant is associated with increased cancer risk or if this  is a normal finding, but most variants such as this get reclassified to being inconsequential. It should not be used to make medical management decisions. With time, we suspect the lab will determine the significance of this variant, if any. If we do learn more about it, we will try to contact Ms. Kronk to discuss it further. However, it is important to stay in touch with [place] periodically and keep the address and phone number up  to date.   CANCER SCREENING RECOMMENDATIONS: This result is reassuring and indicates that Ms. Inclan likely does not have an increased risk for a future cancer due to a mutation in one of these genes. This normal test also suggests that Ms. Berhane's cancer was most likely not due to an inherited predisposition associated with one of these genes.  Most cancers happen by chance and this negative test suggests that her cancer falls into this category.  We, therefore, recommended she continue to follow the cancer management and screening guidelines provided by her oncology and primary healthcare provider.   RECOMMENDATIONS FOR FAMILY MEMBERS: Women in this family might be at some increased risk of developing cancer, over the general population risk, simply due to the family history of cancer. We recommended women in this family have a yearly mammogram beginning at age 59, or 1 years younger than the earliest onset of cancer, an an annual clinical breast exam, and perform monthly breast self-exams. Women in this family should also have a gynecological exam as recommended by their primary provider. All family members should have a colonoscopy by age 49.  FOLLOW-UP: Lastly, we discussed with Ms. Wiersma that cancer genetics is a rapidly advancing field and it is possible that new genetic tests will be appropriate for her and/or her family members in the future. We encouraged her to remain in contact with cancer genetics on an annual basis so we can update her personal and family histories and let her know of advances in cancer genetics that may benefit this family.   Our contact number was provided. Ms. Geno's questions were answered to her satisfaction, and she knows she is welcome to call us at anytime with additional questions or concerns.   Roma Kayser, MS, [facility] Certified Genetic Counselor [email or web address]

## 2015-11-07 ENCOUNTER — Ambulatory Visit: Payer: PRIVATE HEALTH INSURANCE | Admitting: Adult Health

## 2015-11-10 ENCOUNTER — Ambulatory Visit (INDEPENDENT_AMBULATORY_CARE_PROVIDER_SITE_OTHER): Payer: PRIVATE HEALTH INSURANCE | Admitting: Adult Health

## 2015-11-10 ENCOUNTER — Encounter: Payer: Self-pay | Admitting: Adult Health

## 2015-11-10 VITALS — BP 138/80 | HR 88 | Ht 64.0 in | Wt 187.0 lb

## 2015-11-10 DIAGNOSIS — I10 Essential (primary) hypertension: Secondary | ICD-10-CM

## 2015-11-10 NOTE — Patient Instructions (Signed)
Take microzide daily Use condoms Follow up in 7 months for pap and physical

## 2015-11-10 NOTE — Progress Notes (Signed)
Subjective:     Patient ID: Bonnie Brown, female   DOB: Nov 04, 1980, 35 y.o.   MRN: [id number]  HPI Bonnie Brown is a 35 year old white female, married, recently diagnosed with left breast cancer, and is in for BP check.No complaints, just tense at times, and some hot flashes.She is off micronor.  Review of Systems  Patient denies any headaches, hearing loss, fatigue, blurred vision, shortness of breath, chest pain, abdominal pain, problems with bowel movements, urination, or intercourse. No joint pain or mood swings.See HPI for positives. Reviewed past medical,surgical, social and family history. Reviewed medications and allergies.     Objective:   Physical Exam BP 138/80 mmHg  Pulse 88  Ht 5\' 4"  (1.626 m)  Wt 187 lb (84.823 kg)  BMI 32.08 kg/m2  LMP 11/09/2015  Breastfeeding? No Skin warm and dry. Lungs: clear to ausculation bilaterally. Cardiovascular: regular rate and rhythm.   We discussed her up coming surgery 6/5 for bilateral mastectomy, has breast cancer on the left, and is getting expanders and then implants.Genetic testing negative.  She is encouraged to use condoms, now is not good time to get pregnant.  Assessment:     Hypertension     Plan:    Use condoms Continue Microzide 12.5 mg daily Follow up in December for pap and physical

## 2015-11-11 ENCOUNTER — Telehealth: Payer: Self-pay | Admitting: Hematology and Oncology

## 2015-11-11 ENCOUNTER — Encounter: Payer: Self-pay | Admitting: *Deleted

## 2015-11-11 NOTE — Telephone Encounter (Signed)
cld & spoke to pt and gave pt time & date of appt for 6/12@11 

## 2015-11-21 ENCOUNTER — Other Ambulatory Visit: Payer: Self-pay | Admitting: Adult Health

## 2015-11-22 ENCOUNTER — Encounter: Payer: Self-pay | Admitting: Hematology and Oncology

## 2015-11-22 NOTE — Progress Notes (Signed)
left in box- requested notes. I faxed  [phone]

## 2015-12-02 ENCOUNTER — Encounter (HOSPITAL_COMMUNITY): Payer: Self-pay

## 2015-12-02 ENCOUNTER — Ambulatory Visit: Payer: Self-pay | Admitting: Physician Assistant

## 2015-12-02 NOTE — Pre-Procedure Instructions (Addendum)
KOURTNEE INSINGA  12/02/2015      [facility] - [place], [place] - [place] [address] Phone: [phone] Fax: [phone]  [facility] [place] [address] Phone: [phone] Fax: [phone]  [place], [place] [address] [place] [address] Phone: [phone] Fax: [phone]    Your procedure is scheduled on 12/12/15     Monday   Report to [facility] at 530 A.M.  Call this number if you have problems the morning of surgery:  [phone]   Remember:  Do not eat food or drink liquids after midnight.  Take these medicines the morning of surgery with A SIP OF WATER levothyroxine  STOP all herbel meds, nsaids (aleve,naproxen,advil,ibuprofen) starting now including vitamins, aspirin   Do not wear jewelry, make-up or nail polish.  Do not wear lotions, powders, or perfumes.  You may wear deodorant.  Do not shave 48 hours prior to surgery.  Men may shave face and neck.  Do not bring valuables to the hospital.  [facility] is not responsible for any belongings or valuables.  Contacts, dentures or bridgework may not be worn into surgery.  Leave your suitcase in the car.  After surgery it may be brought to your room.  For patients admitted to the hospital, discharge time will be determined by your treatment team.  Patients discharged the day of surgery will not be allowed to drive home.   Name and phone number of your driver:    Special instructions:   Special Instructions: [facility] - Preparing for Surgery  Before surgery, you can play an important role.  Because skin is not sterile, your skin needs to be as free of germs as possible.  You can reduce the number of germs on you skin by washing with CHG (chlorahexidine gluconate) soap before surgery.  CHG is  an antiseptic cleaner which kills germs and bonds with the skin to continue killing germs even after washing.  Please DO NOT use if you have an allergy to CHG or antibacterial soaps.  If your skin becomes reddened/irritated stop using the CHG and inform your nurse when you arrive at Short Stay.  Do not shave (including legs and underarms) for at least 48 hours prior to the first CHG shower.  You may shave your face.  Please follow these instructions carefully:   1.  Shower with CHG Soap the night before surgery and the morning of Surgery.  2.  If you choose to wash your hair, wash your hair first as usual with your normal shampoo.  3.  After you shampoo, rinse your hair and body thoroughly to remove the Shampoo.  4.  Use CHG as you would any other liquid soap.  You can apply chg directly  to the skin and wash gently with scrungie or a clean washcloth.  5.  Apply the CHG Soap to your body ONLY FROM THE NECK DOWN.  Do not use on open wounds or open sores.  Avoid contact with your eyes ears, mouth and genitals (private parts).  Wash genitals (private parts)       with your normal soap.  6.  Wash thoroughly, paying special attention to the area where your surgery will be performed.  7.  Thoroughly rinse your body with warm water  from the neck down.  8.  DO NOT shower/wash with your normal soap after using and rinsing off the CHG Soap.  9.  Pat yourself dry with a clean towel.            10.  Wear clean pajamas.            11.  Place clean sheets on your bed the night of your first shower and do not sleep with pets.  Day of Surgery  Do not apply any lotions/deodorants the morning of surgery.  Please wear clean clothes to the hospital/surgery center.  Please read over the following fact sheets that you were given. Pain Booklet

## 2015-12-06 ENCOUNTER — Encounter (HOSPITAL_COMMUNITY): Payer: Self-pay

## 2015-12-06 ENCOUNTER — Encounter (HOSPITAL_COMMUNITY)
Admission: RE | Admit: 2015-12-06 | Discharge: 2015-12-06 | Disposition: A | Discharge status: Home / Self Care | Payer: PRIVATE HEALTH INSURANCE | Source: Ambulatory Visit | Attending: General Surgery | Admitting: General Surgery

## 2015-12-06 DIAGNOSIS — C50912 Malignant neoplasm of unspecified site of left female breast: Secondary | ICD-10-CM | POA: Insufficient documentation

## 2015-12-06 DIAGNOSIS — Z01812 Encounter for preprocedural laboratory examination: Secondary | ICD-10-CM | POA: Diagnosis present

## 2015-12-06 LAB — CBC WITH DIFFERENTIAL/PLATELET
Basophils Absolute: 0 10*3/uL (ref 0.0–0.1)
Basophils Relative: 0 %
EOS ABS: 0.1 10*3/uL (ref 0.0–0.7)
EOS PCT: 1 %
HCT: 43.3 % (ref 36.0–46.0)
Hemoglobin: 13.6 g/dL (ref 12.0–15.0)
LYMPHS ABS: 2.9 10*3/uL (ref 0.7–4.0)
Lymphocytes Relative: 31 %
MCH: 28.6 pg (ref 26.0–34.0)
MCHC: 31.4 g/dL (ref 30.0–36.0)
MCV: 91 fL (ref 78.0–100.0)
MONO ABS: 0.6 10*3/uL (ref 0.1–1.0)
MONOS PCT: 7 %
Neutro Abs: 5.7 10*3/uL (ref 1.7–7.7)
Neutrophils Relative %: 61 %
PLATELETS: 415 10*3/uL — AB (ref 150–400)
RBC: 4.76 MIL/uL (ref 3.87–5.11)
RDW: 12.9 % (ref 11.5–15.5)
WBC: 9.3 10*3/uL (ref 4.0–10.5)

## 2015-12-06 LAB — HCG, SERUM, QUALITATIVE: PREG SERUM: NEGATIVE

## 2015-12-06 LAB — COMPREHENSIVE METABOLIC PANEL
ALK PHOS: 74 U/L (ref 38–126)
ALT: 15 U/L (ref 14–54)
AST: 19 U/L (ref 15–41)
Albumin: 3.9 g/dL (ref 3.5–5.0)
Anion gap: 11 (ref 5–15)
BILIRUBIN TOTAL: 0.6 mg/dL (ref 0.3–1.2)
BUN: 9 mg/dL (ref 6–20)
CALCIUM: 9.1 mg/dL (ref 8.9–10.3)
CHLORIDE: 103 mmol/L (ref 101–111)
CO2: 24 mmol/L (ref 22–32)
CREATININE: 0.79 mg/dL (ref 0.44–1.00)
Glucose, Bld: 97 mg/dL (ref 65–99)
Potassium: 3.6 mmol/L (ref 3.5–5.1)
Sodium: 138 mmol/L (ref 135–145)
Total Protein: 7 g/dL (ref 6.5–8.1)

## 2015-12-09 NOTE — H&P (Signed)
Bonnie Brown  Location: [facility] Patient #: [id number] DOB: [birth year] Married / Language: English / Race: White Female       History of Present Illness   The patient is a 35 year old female who presents with breast cancer. This is a 35 year old Caucasian female who returns for planning of her bilateral mastectomies.  History significant for mantle radiation for Hodgkin's disease in 1992. No prior breast problems. No family history of breast disease. Imaging studies showed some calcifications in the lower inner left side an image guided biopsy showed atypical ductal hyperplasia and lobular carcinoma in situ. Subsequent left breast lumpectomy performed on September 06, 2015 showed low grade papillary ductal carcinoma in situ and lobular carcinoma in situ. DCIS was 1 mm from the inferior margin and LCIS was present at the lateral margin. Hormone receptors positive.  We discussed her case at breast conference. She saw Dr. Lindi Adie who discussed antiestrogen therapy and close surveillance. She saw Dr. Pablo Ledger who is reluctant to offer radiation therapy because of prior radiation therapy and potential damage. Discussion of bilateral mastectomies ensued and the patient decided this was what she wanted to do. She has had genetic testing drawn but the results are still pending as of today.  Because of her young age she wants to go ahead with bilateral mastectomy. She has seen Dr. Marla Roe. Because her breasts are large and ptotic she is not a candidate for nipple sparing mastectomy. We are going to plan bilateral skin sparing mastectomies, left axillary sentinel node biopsy and implant-based reconstruction. I discussed the indications, details, techniques, and numerous risk of the surgery. She is aware of the risk of bleeding, infection, skin necrosis, multiple reoperations required to get good cosmetic result, nerve damage with chronic pain or numbness, arm swelling,  arm numbness, shoulder disability. She understands all these issues well. All of her questions are answered. She agrees with this plan.   Addendum Note Genetic testing is negative for any known pathogenic mutations, 32 genes on the comprehensive cancer panel.   Allergies Seldane *ANTIHISTAMINES* Codeine Sulfate *ANALGESICS - OPIOID* Penicillin V *PENICILLINS* Hives, Rash.  Medication History Tylenol (500MG  Capsule, Oral) Active. Synthroid (125MCG Tablet, Oral) Active. Norethindrone (0.35MG  Tablet, Oral) Active. HydroCHLOROthiazide (12.5MG  Capsule, Oral) Active. Medications Reconciled  Vitals  Weight: 183 lb Height: 64in Body Surface Area: 1.88 m Body Mass Index: 31.41 kg/m  Temp.: 97.49F(Temporal)  Pulse: 76 (Regular)  BP: 134/80 (Sitting, Left Arm, Standard)   Physical Exam  General Mental Status-Alert. General Appearance-Not in acute distress. Build & Nutrition-Well nourished. Posture-Normal posture. Gait-Normal.  Head and Neck Head-normocephalic, atraumatic with no lesions or palpable masses. Trachea-midline. Thyroid Gland Characteristics - normal size and consistency and no palpable nodules. Note: Well-healed thyroidectomy scar   Chest and Lung Exam Chest and lung exam reveals -on auscultation, normal breath sounds, no adventitious sounds and normal vocal resonance. Note: Transverse incision overlying left clavicle from previous lymph node biopsy.   Breast Note: Breasts are large and ptotic. 38D by history. Still has bruising on the right side periareolar area. Left side is soft and has healed well from the lumpectomy that was done through a circumareolar incision. No axillary adenopathy on either side.   Cardiovascular Cardiovascular examination reveals -normal heart sounds, regular rate and rhythm with no murmurs and femoral artery auscultation bilaterally reveals normal pulses, no bruits, no  thrills.  Abdomen Inspection Inspection of the abdomen reveals - No Hernias. Palpation/Percussion Palpation and Percussion of the abdomen reveal - Soft, Non Tender,  No Rigidity (guarding), No hepatosplenomegaly and No Palpable abdominal masses.  Neurologic Neurologic evaluation reveals -alert and oriented x 3 with no impairment of recent or remote memory, normal attention span and ability to concentrate, normal sensation and normal coordination.  Musculoskeletal Normal Exam - Bilateral-Upper Extremity Strength Normal and Lower Extremity Strength Normal.    Assessment & Plan  BREAST CANCER OF LOWER-INNER QUADRANT OF LEFT FEMALE BREAST (C50.312)   MRI showed enhancement in the right breast behind the nipple which was felt to be suspicious. This was biopsied and showed completely benign fibrocystic changes. There was a second area of enhancement in the left breast at the 12 o'clock position which was also felt to be suspicious. Because you're going to have bilateral mastectomies we did not feel it was necessary to biopsy that area. You have seen Dr. Marla Roe and she has discussed your options.  We have decided to proceed with left total mastectomy with left axillary sentinel node biopsy, right prophylactic mastectomy, bilateral implant-based reconstruction. We've discussed the indications, techniques and risks of the surgery in detail We will schedule this surgery for you sometime in mid to late May once we coordinate with Dr. Marla Roe.  HISTORY OF HODGKIN'S DISEASE (Z85.71) HISTORY OF EXTERNAL BEAM RADIATION THERAPY (Z92.3) HISTORY OF THYROIDECTOMY, TOTAL (E89.0) BMI 31.0-31.9,ADULT (Z68.31) BENIGN HYPERTENSION (I10) ATYPICAL DUCTAL HYPERPLASIA OF LEFT BREAST (N60.92)   Winslow Verrill M. Dalbert Batman, M.D., [facility] General and Minimally invasive Surgery Breast and Colorectal Surgery Office:   [phone] Pager:   [phone]

## 2015-12-11 MED ORDER — CIPROFLOXACIN IN D5W 400 MG/200ML IV SOLN
400.0000 mg | INTRAVENOUS | Status: AC
  Administered 2015-12-12: 400 mg via INTRAVENOUS
  Filled 2015-12-11: qty 200

## 2015-12-11 NOTE — Anesthesia Preprocedure Evaluation (Addendum)
Anesthesia Evaluation  Patient identified by MRN, date of birth, ID band Patient awake    Reviewed: Allergy & Precautions, NPO status , Patient's Chart, lab work & pertinent test results  History of Anesthesia Complications Negative for: history of anesthetic complications  Airway Mallampati: II  TM Distance: >3 FB Neck ROM: Full    Dental  (+) Teeth Intact, Dental Advisory Given   Pulmonary neg pulmonary ROS,    breath sounds clear to auscultation       Cardiovascular hypertension, Pt. on medications (-) angina Rhythm:Regular Rate:Normal     Neuro/Psych  Headaches,    GI/Hepatic negative GI ROS, Neg liver ROS,   Endo/Other  Hypothyroidism Morbid obesity  Renal/GU negative Renal ROS     Musculoskeletal   Abdominal (+) + obese,   Peds  Hematology H/o Hodgkin's: chemo   Anesthesia Other Findings   Reproductive/Obstetrics 12/06/15 preg test: NEG                           Anesthesia Physical Anesthesia Plan  ASA: II  Anesthesia Plan: General and Regional   Post-op Pain Management: GA combined w/ Regional for post-op pain   Induction: Intravenous  Airway Management Planned: Oral ETT  Additional Equipment:   Intra-op Plan:   Post-operative Plan: Extubation in OR  Informed Consent: I have reviewed the patients History and Physical, chart, labs and discussed the procedure including the risks, benefits and alternatives for the proposed anesthesia with the patient or authorized representative who has indicated his/her understanding and acceptance.   Dental advisory given  Plan Discussed with: CRNA, Anesthesiologist and Surgeon  Anesthesia Plan Comments: (Patient extremely difficult PIV stick. Pt states previous history of chemotherapy being administered ""through her veins" vs a port-a-cath. Plan routine monitors, GETA with bilateral pec blocks for post op analgesia)        Anesthesia Quick Evaluation

## 2015-12-12 ENCOUNTER — Other Ambulatory Visit: Payer: Self-pay | Admitting: Plastic Surgery

## 2015-12-12 ENCOUNTER — Encounter (HOSPITAL_COMMUNITY)
Admission: RE | Admit: 2015-12-12 | Discharge: 2015-12-12 | Disposition: A | Discharge status: Home / Self Care | Payer: PRIVATE HEALTH INSURANCE | Source: Ambulatory Visit | Attending: General Surgery | Admitting: General Surgery

## 2015-12-12 ENCOUNTER — Encounter (HOSPITAL_COMMUNITY): Payer: Self-pay | Admitting: General Practice

## 2015-12-12 ENCOUNTER — Ambulatory Visit (HOSPITAL_COMMUNITY): Payer: PRIVATE HEALTH INSURANCE | Admitting: Certified Registered Nurse Anesthetist

## 2015-12-12 ENCOUNTER — Encounter (HOSPITAL_COMMUNITY)
Admission: AD | Disposition: A | Discharge status: Home / Self Care | Payer: Self-pay | Source: Ambulatory Visit | Attending: Plastic Surgery

## 2015-12-12 ENCOUNTER — Inpatient Hospital Stay (HOSPITAL_COMMUNITY)
Admission: AD | Admit: 2015-12-12 | Discharge: 2015-12-13 | DRG: 581 | Disposition: A | Discharge status: Home / Self Care | Payer: PRIVATE HEALTH INSURANCE | Source: Ambulatory Visit | Attending: Plastic Surgery | Admitting: Plastic Surgery

## 2015-12-12 DIAGNOSIS — E890 Postprocedural hypothyroidism: Secondary | ICD-10-CM | POA: Diagnosis present

## 2015-12-12 DIAGNOSIS — Z6831 Body mass index (BMI) 31.0-31.9, adult: Secondary | ICD-10-CM

## 2015-12-12 DIAGNOSIS — C50312 Malignant neoplasm of lower-inner quadrant of left female breast: Principal | ICD-10-CM | POA: Diagnosis present

## 2015-12-12 DIAGNOSIS — Z8571 Personal history of Hodgkin lymphoma: Secondary | ICD-10-CM | POA: Diagnosis not present

## 2015-12-12 DIAGNOSIS — C50912 Malignant neoplasm of unspecified site of left female breast: Secondary | ICD-10-CM

## 2015-12-12 DIAGNOSIS — N6092 Unspecified benign mammary dysplasia of left breast: Secondary | ICD-10-CM | POA: Diagnosis present

## 2015-12-12 DIAGNOSIS — I10 Essential (primary) hypertension: Secondary | ICD-10-CM | POA: Diagnosis present

## 2015-12-12 DIAGNOSIS — C50919 Malignant neoplasm of unspecified site of unspecified female breast: Secondary | ICD-10-CM | POA: Diagnosis present

## 2015-12-12 HISTORY — PX: BREAST RECONSTRUCTION WITH PLACEMENT OF TISSUE EXPANDER AND FLEX HD (ACELLULAR HYDRATED DERMIS): SHX6295

## 2015-12-12 HISTORY — PX: MASTECTOMY W/ SENTINEL NODE BIOPSY: SHX2001

## 2015-12-12 LAB — BASIC METABOLIC PANEL
Anion gap: 7 (ref 5–15)
BUN: 10 mg/dL (ref 6–20)
CO2: 25 mmol/L (ref 22–32)
Calcium: 8.2 mg/dL — ABNORMAL LOW (ref 8.9–10.3)
Chloride: 105 mmol/L (ref 101–111)
Creatinine, Ser: 0.69 mg/dL (ref 0.44–1.00)
GFR calc Af Amer: >60 mL/min (ref >60)
GLUCOSE: 128 mg/dL — AB (ref 65–99)
POTASSIUM: 4 mmol/L (ref 3.5–5.1)
Sodium: 137 mmol/L (ref 135–145)

## 2015-12-12 LAB — CBC
HCT: 39.9 % (ref 36.0–46.0)
Hemoglobin: 12.7 g/dL (ref 12.0–15.0)
MCH: 28.4 pg (ref 26.0–34.0)
MCHC: 31.8 g/dL (ref 30.0–36.0)
MCV: 89.3 fL (ref 78.0–100.0)
PLATELETS: 384 10*3/uL (ref 150–400)
RBC: 4.47 MIL/uL (ref 3.87–5.11)
RDW: 12.9 % (ref 11.5–15.5)
WBC: 23.5 10*3/uL — ABNORMAL HIGH (ref 4.0–10.5)

## 2015-12-12 SURGERY — MASTECTOMY WITH SENTINEL LYMPH NODE BIOPSY
Anesthesia: General | Site: Chest | Laterality: Bilateral

## 2015-12-12 MED ORDER — NAPROXEN 250 MG PO TABS
500.0000 mg | ORAL_TABLET | Freq: Two times a day (BID) | ORAL | Status: DC | PRN
  Administered 2015-12-13: 500 mg via ORAL
  Filled 2015-12-12: qty 2

## 2015-12-12 MED ORDER — METHYLENE BLUE 0.5 % INJ SOLN
INTRAVENOUS | Status: AC
  Filled 2015-12-12: qty 10

## 2015-12-12 MED ORDER — POLYETHYLENE GLYCOL 3350 17 G PO PACK: 17.0000 g | PACK | Freq: Every day | ORAL | Status: DC | PRN

## 2015-12-12 MED ORDER — CHLORHEXIDINE GLUCONATE 4 % EX LIQD: 1.0000 "application " | Freq: Once | CUTANEOUS | Status: DC

## 2015-12-12 MED ORDER — DIPHENHYDRAMINE HCL 12.5 MG/5ML PO ELIX: 12.5000 mg | ORAL_SOLUTION | Freq: Four times a day (QID) | ORAL | Status: DC | PRN

## 2015-12-12 MED ORDER — HYDROCODONE-ACETAMINOPHEN 5-325 MG PO TABS
1.0000 | ORAL_TABLET | ORAL | Status: DC | PRN
  Administered 2015-12-12: 1 via ORAL
  Administered 2015-12-12: 2 via ORAL
  Administered 2015-12-13: 1 via ORAL
  Administered 2015-12-13 (×2): 2 via ORAL
  Filled 2015-12-12 (×4): qty 2

## 2015-12-12 MED ORDER — MIDAZOLAM HCL 5 MG/5ML IJ SOLN
INTRAMUSCULAR | Status: DC | PRN
  Administered 2015-12-12 (×2): 1 mg via INTRAVENOUS

## 2015-12-12 MED ORDER — HYDROMORPHONE HCL 1 MG/ML IJ SOLN
0.2500 mg | INTRAMUSCULAR | Status: DC | PRN
  Administered 2015-12-12 (×3): 0.5 mg via INTRAVENOUS

## 2015-12-12 MED ORDER — DIAZEPAM 5 MG PO TABS
ORAL_TABLET | ORAL | Status: AC
  Administered 2015-12-12: 2 mg via ORAL
  Filled 2015-12-12: qty 1

## 2015-12-12 MED ORDER — TECHNETIUM TC 99M SULFUR COLLOID FILTERED
1.0000 | Freq: Once | INTRAVENOUS | Status: DC | PRN
Start: 2015-12-12 — End: 2015-12-18

## 2015-12-12 MED ORDER — FENTANYL CITRATE (PF) 250 MCG/5ML IJ SOLN
INTRAMUSCULAR | Status: AC
  Filled 2015-12-12: qty 5

## 2015-12-12 MED ORDER — LEVOTHYROXINE SODIUM 25 MCG PO TABS
125.0000 ug | ORAL_TABLET | Freq: Every day | ORAL | Status: DC
  Administered 2015-12-13: 125 ug via ORAL
  Filled 2015-12-12: qty 1

## 2015-12-12 MED ORDER — MIDAZOLAM HCL 2 MG/2ML IJ SOLN: 0.5000 mg | Freq: Once | INTRAMUSCULAR | Status: DC | PRN

## 2015-12-12 MED ORDER — SODIUM CHLORIDE 0.9 % IV SOLN
INTRAVENOUS | Status: DC | PRN
  Administered 2015-12-12: 500 mL via INTRAMUSCULAR

## 2015-12-12 MED ORDER — VITAMIN C 500 MG PO TABS
500.0000 mg | ORAL_TABLET | Freq: Every day | ORAL | Status: DC
  Administered 2015-12-12 – 2015-12-13 (×2): 500 mg via ORAL
  Filled 2015-12-12 (×2): qty 1

## 2015-12-12 MED ORDER — HYDROMORPHONE HCL 1 MG/ML IJ SOLN
INTRAMUSCULAR | Status: AC
  Administered 2015-12-12: 0.5 mg via INTRAVENOUS
  Filled 2015-12-12: qty 1

## 2015-12-12 MED ORDER — ROCURONIUM BROMIDE 50 MG/5ML IV SOLN
INTRAVENOUS | Status: AC
  Filled 2015-12-12: qty 1

## 2015-12-12 MED ORDER — KCL IN DEXTROSE-NACL 20-5-0.45 MEQ/L-%-% IV SOLN
INTRAVENOUS | Status: AC
  Filled 2015-12-12: qty 1000

## 2015-12-12 MED ORDER — BUPIVACAINE-EPINEPHRINE (PF) 0.5% -1:200000 IJ SOLN
INTRAMUSCULAR | Status: DC | PRN
  Administered 2015-12-12 (×2): 30 mL

## 2015-12-12 MED ORDER — MORPHINE SULFATE (PF) 2 MG/ML IV SOLN
2.0000 mg | INTRAVENOUS | Status: DC | PRN
  Administered 2015-12-12 (×2): 2 mg via INTRAVENOUS
  Filled 2015-12-12 (×2): qty 1

## 2015-12-12 MED ORDER — BUPIVACAINE HCL (PF) 0.5 % IJ SOLN: INTRAMUSCULAR | Status: DC | PRN

## 2015-12-12 MED ORDER — SODIUM CHLORIDE 0.9 % IR SOLN
Status: DC | PRN
  Administered 2015-12-12 (×2): 500 mL

## 2015-12-12 MED ORDER — NEOSTIGMINE METHYLSULFATE 5 MG/5ML IV SOSY
PREFILLED_SYRINGE | INTRAVENOUS | Status: DC | PRN
  Administered 2015-12-12: 4 mg via INTRAVENOUS

## 2015-12-12 MED ORDER — PROMETHAZINE HCL 25 MG/ML IJ SOLN: 6.2500 mg | INTRAMUSCULAR | Status: DC | PRN

## 2015-12-12 MED ORDER — SENNA 8.6 MG PO TABS
1.0000 | ORAL_TABLET | Freq: Two times a day (BID) | ORAL | Status: DC
  Administered 2015-12-12 – 2015-12-13 (×3): 8.6 mg via ORAL
  Filled 2015-12-12 (×3): qty 1

## 2015-12-12 MED ORDER — PHENYLEPHRINE HCL 10 MG/ML IJ SOLN: 10.0000 mg | INTRAMUSCULAR | Status: DC | PRN

## 2015-12-12 MED ORDER — ONDANSETRON HCL 4 MG/2ML IJ SOLN: 4.0000 mg | Freq: Four times a day (QID) | INTRAMUSCULAR | Status: DC | PRN

## 2015-12-12 MED ORDER — PROPOFOL 10 MG/ML IV BOLUS
INTRAVENOUS | Status: DC | PRN
  Administered 2015-12-12: 200 mg via INTRAVENOUS

## 2015-12-12 MED ORDER — DIPHENHYDRAMINE HCL 50 MG/ML IJ SOLN: 12.5000 mg | Freq: Four times a day (QID) | INTRAMUSCULAR | Status: DC | PRN

## 2015-12-12 MED ORDER — LIDOCAINE 2% (20 MG/ML) 5 ML SYRINGE
INTRAMUSCULAR | Status: AC
  Filled 2015-12-12: qty 5

## 2015-12-12 MED ORDER — KCL IN DEXTROSE-NACL 20-5-0.45 MEQ/L-%-% IV SOLN
INTRAVENOUS | Status: DC
  Administered 2015-12-12: 12:00:00 via INTRAVENOUS
  Administered 2015-12-12: 125 mL/h via INTRAVENOUS
  Filled 2015-12-12 (×2): qty 1000

## 2015-12-12 MED ORDER — LIDOCAINE 2% (20 MG/ML) 5 ML SYRINGE
INTRAMUSCULAR | Status: DC | PRN
  Administered 2015-12-12: 20 mg via INTRAVENOUS

## 2015-12-12 MED ORDER — VANCOMYCIN HCL 1000 MG IV SOLR
1000.0000 mg | INTRAVENOUS | Status: DC | PRN
  Administered 2015-12-12: 1000 mg via INTRAVENOUS

## 2015-12-12 MED ORDER — MIDAZOLAM HCL 2 MG/2ML IJ SOLN
INTRAMUSCULAR | Status: AC
  Filled 2015-12-12: qty 2

## 2015-12-12 MED ORDER — PHENYLEPHRINE HCL 10 MG/ML IJ SOLN
10.0000 mg | INTRAVENOUS | Status: DC | PRN
  Administered 2015-12-12: 10 ug/min via INTRAVENOUS

## 2015-12-12 MED ORDER — ONDANSETRON HCL 4 MG/2ML IJ SOLN
INTRAMUSCULAR | Status: AC
  Filled 2015-12-12: qty 2

## 2015-12-12 MED ORDER — CIPROFLOXACIN IN D5W 400 MG/200ML IV SOLN
400.0000 mg | Freq: Two times a day (BID) | INTRAVENOUS | Status: DC
  Administered 2015-12-12 – 2015-12-13 (×3): 400 mg via INTRAVENOUS
  Filled 2015-12-12 (×4): qty 200

## 2015-12-12 MED ORDER — FENTANYL CITRATE (PF) 100 MCG/2ML IJ SOLN
INTRAMUSCULAR | Status: DC | PRN
  Administered 2015-12-12 (×3): 50 ug via INTRAVENOUS
  Administered 2015-12-12: 150 ug via INTRAVENOUS
  Administered 2015-12-12 (×2): 50 ug via INTRAVENOUS

## 2015-12-12 MED ORDER — ONDANSETRON HCL 4 MG/2ML IJ SOLN
INTRAMUSCULAR | Status: DC | PRN
  Administered 2015-12-12: 4 mg via INTRAVENOUS

## 2015-12-12 MED ORDER — HYDROCODONE-ACETAMINOPHEN 5-325 MG PO TABS
ORAL_TABLET | ORAL | Status: AC
  Administered 2015-12-12: 1 via ORAL
  Filled 2015-12-12: qty 1

## 2015-12-12 MED ORDER — PROPOFOL 10 MG/ML IV BOLUS
INTRAVENOUS | Status: AC
  Filled 2015-12-12: qty 40

## 2015-12-12 MED ORDER — 0.9 % SODIUM CHLORIDE (POUR BTL) OPTIME
TOPICAL | Status: DC | PRN
  Administered 2015-12-12 (×4): 1000 mL

## 2015-12-12 MED ORDER — MEPERIDINE HCL 25 MG/ML IJ SOLN: 6.2500 mg | INTRAMUSCULAR | Status: DC | PRN

## 2015-12-12 MED ORDER — DEXAMETHASONE SODIUM PHOSPHATE 10 MG/ML IJ SOLN
INTRAMUSCULAR | Status: AC
  Filled 2015-12-12: qty 1

## 2015-12-12 MED ORDER — HYDROCHLOROTHIAZIDE 12.5 MG PO CAPS
12.5000 mg | ORAL_CAPSULE | Freq: Every day | ORAL | Status: DC
  Administered 2015-12-12 – 2015-12-13 (×2): 12.5 mg via ORAL
  Filled 2015-12-12 (×2): qty 1

## 2015-12-12 MED ORDER — ROCURONIUM BROMIDE 10 MG/ML (PF) SYRINGE
PREFILLED_SYRINGE | INTRAVENOUS | Status: DC | PRN
  Administered 2015-12-12: 10 mg via INTRAVENOUS
  Administered 2015-12-12: 30 mg via INTRAVENOUS
  Administered 2015-12-12: 10 mg via INTRAVENOUS
  Administered 2015-12-12: 5 mg via INTRAVENOUS
  Administered 2015-12-12: 50 mg via INTRAVENOUS
  Administered 2015-12-12: 10 mg via INTRAVENOUS

## 2015-12-12 MED ORDER — ONDANSETRON 4 MG PO TBDP: 4.0000 mg | ORAL_TABLET | Freq: Four times a day (QID) | ORAL | Status: DC | PRN

## 2015-12-12 MED ORDER — NEOSTIGMINE METHYLSULFATE 5 MG/5ML IV SOSY
PREFILLED_SYRINGE | INTRAVENOUS | Status: AC
  Filled 2015-12-12: qty 5

## 2015-12-12 MED ORDER — LACTATED RINGERS IV SOLN
INTRAVENOUS | Status: DC | PRN
  Administered 2015-12-12 (×2): via INTRAVENOUS

## 2015-12-12 MED ORDER — GLYCOPYRROLATE 0.2 MG/ML IV SOSY
PREFILLED_SYRINGE | INTRAVENOUS | Status: DC | PRN
  Administered 2015-12-12: 0.6 mg via INTRAVENOUS

## 2015-12-12 MED ORDER — DEXAMETHASONE SODIUM PHOSPHATE 10 MG/ML IJ SOLN
INTRAMUSCULAR | Status: DC | PRN
  Administered 2015-12-12: 5 mg via INTRAVENOUS

## 2015-12-12 MED ORDER — PHENYLEPHRINE 40 MCG/ML (10ML) SYRINGE FOR IV PUSH (FOR BLOOD PRESSURE SUPPORT)
PREFILLED_SYRINGE | INTRAVENOUS | Status: DC | PRN
  Administered 2015-12-12: 40 ug via INTRAVENOUS
  Administered 2015-12-12 (×3): 80 ug via INTRAVENOUS

## 2015-12-12 MED ORDER — ACETAMINOPHEN 500 MG PO TABS
1000.0000 mg | ORAL_TABLET | Freq: Four times a day (QID) | ORAL | Status: DC
  Administered 2015-12-12 – 2015-12-13 (×4): 1000 mg via ORAL
  Filled 2015-12-12 (×4): qty 2

## 2015-12-12 MED ORDER — SODIUM CHLORIDE 0.9 % IJ SOLN
INTRAMUSCULAR | Status: DC | PRN
  Administered 2015-12-12: 5 mL

## 2015-12-12 MED ORDER — PHENYLEPHRINE 40 MCG/ML (10ML) SYRINGE FOR IV PUSH (FOR BLOOD PRESSURE SUPPORT)
PREFILLED_SYRINGE | INTRAVENOUS | Status: AC
  Filled 2015-12-12: qty 10

## 2015-12-12 MED ORDER — GLYCOPYRROLATE 0.2 MG/ML IV SOSY
PREFILLED_SYRINGE | INTRAVENOUS | Status: AC
Start: 2015-12-12 — End: 2015-12-12
  Filled 2015-12-12: qty 3

## 2015-12-12 MED ORDER — DIAZEPAM 2 MG PO TABS
2.0000 mg | ORAL_TABLET | Freq: Two times a day (BID) | ORAL | Status: DC | PRN
  Administered 2015-12-12 – 2015-12-13 (×2): 2 mg via ORAL
  Filled 2015-12-12 (×2): qty 1

## 2015-12-12 MED ORDER — SODIUM CHLORIDE 0.9 % IJ SOLN
INTRAMUSCULAR | Status: AC
  Filled 2015-12-12: qty 10

## 2015-12-12 MED ORDER — VANCOMYCIN HCL IN DEXTROSE 1-5 GM/200ML-% IV SOLN
INTRAVENOUS | Status: AC
  Filled 2015-12-12: qty 200

## 2015-12-12 SURGICAL SUPPLY — 86 items
APPLIER CLIP 9.375 MED OPEN (MISCELLANEOUS) ×4
BAG DECANTER FOR FLEXI CONT (MISCELLANEOUS) ×8 IMPLANT
BINDER BREAST LRG (GAUZE/BANDAGES/DRESSINGS) IMPLANT
BINDER BREAST XLRG (GAUZE/BANDAGES/DRESSINGS) ×4 IMPLANT
BIOPATCH RED 1 DISK 7.0 (GAUZE/BANDAGES/DRESSINGS) ×6 IMPLANT
BIOPATCH RED 1IN DISK 7.0MM (GAUZE/BANDAGES/DRESSINGS) ×2
CANISTER SUCTION 2500CC (MISCELLANEOUS) ×4 IMPLANT
CHLORAPREP W/TINT 26ML (MISCELLANEOUS) ×8 IMPLANT
CLIP APPLIE 9.375 MED OPEN (MISCELLANEOUS) ×2 IMPLANT
CONT SPEC 4OZ CLIKSEAL STRL BL (MISCELLANEOUS) ×4 IMPLANT
COVER PROBE W GEL 5X96 (DRAPES) ×4 IMPLANT
COVER SURGICAL LIGHT HANDLE (MISCELLANEOUS) ×8 IMPLANT
DERMABOND ADVANCED (GAUZE/BANDAGES/DRESSINGS) ×4
DERMABOND ADVANCED .7 DNX12 (GAUZE/BANDAGES/DRESSINGS) ×4 IMPLANT
DEVICE DISSECT PLASMABLAD 3.0S (MISCELLANEOUS) IMPLANT
DRAIN CHANNEL 19F RND (DRAIN) ×8 IMPLANT
DRAPE CHEST BREAST 15X10 FENES (DRAPES) IMPLANT
DRAPE ORTHO SPLIT 77X108 STRL (DRAPES) ×4
DRAPE PROXIMA HALF (DRAPES) ×16 IMPLANT
DRAPE SURG 17X23 STRL (DRAPES) ×16 IMPLANT
DRAPE SURG ORHT 6 SPLT 77X108 (DRAPES) ×4 IMPLANT
DRAPE UTILITY XL STRL (DRAPES) IMPLANT
DRAPE WARM FLUID 44X44 (DRAPE) ×4 IMPLANT
ELECT BLADE 4.0 EZ CLEAN MEGAD (MISCELLANEOUS) ×8
ELECT BLADE 6.5 EXT (BLADE) ×4 IMPLANT
ELECT CAUTERY BLADE 6.4 (BLADE) ×8 IMPLANT
ELECT REM PT RETURN 9FT ADLT (ELECTROSURGICAL) ×8
ELECTRODE BLDE 4.0 EZ CLN MEGD (MISCELLANEOUS) ×4 IMPLANT
ELECTRODE REM PT RTRN 9FT ADLT (ELECTROSURGICAL) ×4 IMPLANT
EVACUATOR SILICONE 100CC (DRAIN) ×8 IMPLANT
GAUZE SPONGE 4X4 12PLY STRL (GAUZE/BANDAGES/DRESSINGS) ×8 IMPLANT
GLOVE BIO SURGEON STRL SZ 6.5 (GLOVE) ×15 IMPLANT
GLOVE BIO SURGEON STRL SZ7.5 (GLOVE) ×4 IMPLANT
GLOVE BIO SURGEON STRL SZ8 (GLOVE) ×12 IMPLANT
GLOVE BIO SURGEONS STRL SZ 6.5 (GLOVE) ×5
GLOVE BIOGEL PI IND STRL 7.0 (GLOVE) ×2 IMPLANT
GLOVE BIOGEL PI IND STRL 7.5 (GLOVE) ×2 IMPLANT
GLOVE BIOGEL PI IND STRL 8 (GLOVE) ×2 IMPLANT
GLOVE BIOGEL PI IND STRL 8.5 (GLOVE) ×4 IMPLANT
GLOVE BIOGEL PI INDICATOR 7.0 (GLOVE) ×2
GLOVE BIOGEL PI INDICATOR 7.5 (GLOVE) ×2
GLOVE BIOGEL PI INDICATOR 8 (GLOVE) ×2
GLOVE BIOGEL PI INDICATOR 8.5 (GLOVE) ×4
GLOVE EUDERMIC 7 POWDERFREE (GLOVE) ×4 IMPLANT
GLOVE SURG SS PI 7.0 STRL IVOR (GLOVE) ×4 IMPLANT
GOWN STRL REUS W/ TWL LRG LVL3 (GOWN DISPOSABLE) ×6 IMPLANT
GOWN STRL REUS W/ TWL XL LVL3 (GOWN DISPOSABLE) ×8 IMPLANT
GOWN STRL REUS W/TWL LRG LVL3 (GOWN DISPOSABLE) ×6
GOWN STRL REUS W/TWL XL LVL3 (GOWN DISPOSABLE) ×8
GRAFT FLEX HD BILAT 4X16 THICK (Tissue Mesh) ×8 IMPLANT
ILLUMINATOR WAVEGUIDE N/F (MISCELLANEOUS) IMPLANT
IMPL BREAST TIS EXP M 350CC (Breast) ×4 IMPLANT
IMPLANT BREAST TIS EXP M 350CC (Breast) ×8 IMPLANT
KIT BASIN OR (CUSTOM PROCEDURE TRAY) ×8 IMPLANT
KIT ROOM TURNOVER OR (KITS) ×4 IMPLANT
LIGHT WAVEGUIDE WIDE FLAT (MISCELLANEOUS) IMPLANT
NEEDLE 18GX1X1/2 (RX/OR ONLY) (NEEDLE) ×4 IMPLANT
NEEDLE FILTER BLUNT 18X 1/2SAF (NEEDLE) ×2
NEEDLE FILTER BLUNT 18X1 1/2 (NEEDLE) ×2 IMPLANT
NEEDLE HYPO 25GX1X1/2 BEV (NEEDLE) ×4 IMPLANT
NS IRRIG 1000ML POUR BTL (IV SOLUTION) ×12 IMPLANT
PACK GENERAL/GYN (CUSTOM PROCEDURE TRAY) ×8 IMPLANT
PAD ABD 8X10 STRL (GAUZE/BANDAGES/DRESSINGS) ×16 IMPLANT
PAD ARMBOARD 7.5X6 YLW CONV (MISCELLANEOUS) IMPLANT
PIN SAFETY STERILE (MISCELLANEOUS) ×4 IMPLANT
PLASMABLADE 3.0S (MISCELLANEOUS)
SET ASEPTIC TRANSFER (MISCELLANEOUS) ×4 IMPLANT
SPECIMEN JAR LARGE (MISCELLANEOUS) ×8 IMPLANT
SPECIMEN JAR X LARGE (MISCELLANEOUS) IMPLANT
SPONGE LAP 18X18 X RAY DECT (DISPOSABLE) ×12 IMPLANT
SUT ETHILON 3 0 FSL (SUTURE) IMPLANT
SUT MNCRL AB 4-0 PS2 18 (SUTURE) ×8 IMPLANT
SUT MON AB 3-0 SH 27 (SUTURE) ×4
SUT MON AB 3-0 SH27 (SUTURE) ×4 IMPLANT
SUT MON AB 5-0 PS2 18 (SUTURE) ×8 IMPLANT
SUT PDS AB 2-0 CT1 27 (SUTURE) ×16 IMPLANT
SUT SILK 2 0 FS (SUTURE) ×4 IMPLANT
SUT SILK 4 0 PS 2 (SUTURE) ×8 IMPLANT
SUT VIC AB 3-0 SH 18 (SUTURE) ×4 IMPLANT
SYR BULB IRRIGATION 50ML (SYRINGE) ×4 IMPLANT
SYR CONTROL 10ML LL (SYRINGE) ×4 IMPLANT
TOWEL OR 17X24 6PK STRL BLUE (TOWEL DISPOSABLE) ×4 IMPLANT
TOWEL OR 17X26 10 PK STRL BLUE (TOWEL DISPOSABLE) ×4 IMPLANT
TRAY FOLEY CATH 14FRSI W/METER (CATHETERS) ×4 IMPLANT
TUBE CONNECTING 12'X1/4 (SUCTIONS)
TUBE CONNECTING 12X1/4 (SUCTIONS) IMPLANT

## 2015-12-12 NOTE — Brief Op Note (Signed)
12/12/2015  11:20 AM  PATIENT:  Bonnie Brown  35 y.o. female  PRE-OPERATIVE DIAGNOSIS:  LEFT BREAST CANCER   POST-OPERATIVE DIAGNOSIS:  LEFT BREAST CANCER   PROCEDURE:  Procedure(s): LEFT TOTAL MASTECTOMY WITH LEFT AXILLARY SENTINEL LYMPH NODE BIOPSY AND RIGHT PROPHYLACTIC MASTECTOMY (Bilateral) BILATERAL BREAST RECONSTRUCTION WITH PLACEMENT OF TISSUE EXPANDERS AND FLEX HD (ACELLULAR HYDRATED DERMIS) (Bilateral)  SURGEON:  Surgeon(s) and Role: Panel 1:    * Fanny Skates, MD - Primary  Panel 2:    * Loel Lofty Jeanann Balinski, DO - Primary  PHYSICIAN ASSISTANT: Shawn Rayburn, PA  ASSISTANTS: none   ANESTHESIA:   general  EBL:  Total I/O In: 1000 [I.V.:1000] Out: 485 [Urine:285; Blood:200]  BLOOD ADMINISTERED:none  DRAINS: (2) Jackson-Pratt drain(s) with closed bulb suction in the breast pocket on each side   LOCAL MEDICATIONS USED:  NONE  SPECIMEN:  No Specimen  DISPOSITION OF SPECIMEN:  N/A  COUNTS:  YES  TOURNIQUET:  * No tourniquets in log *  DICTATION: .Dragon Dictation  PLAN OF CARE: Admit to inpatient   PATIENT DISPOSITION:  PACU - hemodynamically stable.   Delay start of Pharmacological VTE agent (>24hrs) due to surgical blood loss or risk of bleeding: no

## 2015-12-12 NOTE — Op Note (Signed)
Op report    DATE OF OPERATION:  12/12/2015  LOCATION: [facility]  SURGICAL DIVISION: Plastic Surgery  PREOPERATIVE DIAGNOSES:  1. Left Breast cancer.    POSTOPERATIVE DIAGNOSES:  1. Left Breast cancer.   PROCEDURE:  1. Bilateral immediate breast reconstruction with placement of Acellular Dermal Matrix and tissue expanders.  SURGEON: Jaysen Wey Sanger Lonya Johannesen, DO  ASSISTANT: Shawn Rayburn, PA  ANESTHESIA:  General.   COMPLICATIONS: None.   IMPLANTS: Left - Mentor 350 cc expander with 200 cc of normal saline injected Right - Mentor 350 cc expander with 200 cc of normal saline injected Acellular Dermal Matrix 4 x 16 cm two  INDICATIONS FOR PROCEDURE:  The patient, Bonnie Brown, is a 35 y.o. female born on Apr 20, 1981, is here for bilateral immediate first stage breast reconstruction with placement of tissue expander and Acellular dermal matrix. MRN: [id number]  CONSENT:  Informed consent was obtained directly from the patient. Risks, benefits and alternatives were fully discussed. Specific risks including but not limited to bleeding, infection, hematoma, seroma, scarring, pain, implant infection, implant extrusion, capsular contracture, asymmetry, wound healing problems, and need for further surgery were all discussed. The patient did have an ample opportunity to have her questions answered to her satisfaction.   DESCRIPTION OF PROCEDURE:  The patient was taken to the operating room by the general surgery team. SCDs were placed and IV antibiotics were given. The patient's chest was prepped and draped in a sterile fashion. A time out was performed and the implants to be used were identified.  Bilateral mastectomies were performed.  Once the general surgery team had completed their portion of the case the patient was rendered to the plastic and reconstructive surgery team.  Right:  The pectoralis major muscle was lifted from the chest wall with release of the  lateral edge and lateral inframammary fold.  The pocket was irrigated with antibiotic solution and hemostasis was achieved with electrocautery.  The ADM was then prepared according to the manufacture guidelines and slits placed to help with postoperative fluid management.  The ADM was then sutured to the inferior and lateral edge of the inframammary fold with 2-0 PDS starting with an interrupted stitch and then a running stitch.  The lateral portion was sutured to with interrupted sutures after the expander was placed.  The expander was prepared according to the manufacture guidelines, the air evacuated and then it was placed under the ADM and pectoralis major muscle.  The inferior and lateral tabs were used to secure the expander to the chest wall with 2-0 PDS.  The drain was placed at the inframammary fold over the ADM and secured to the skin with 3-0 Silk.   The deep layers were closed with 3-0 Monocryl followed by 4-0 Monocryl.  The skin was closed with 5-0 Monocryl and then dermabond was applied.    Left: The pectoralis major muscle was lifted from the chest wall with release of the lateral edge and lateral inframammary fold.  The pocket was irrigated with antibiotic solution and hemostasis was achieved with electrocautery.  The ADM was then prepared according to the manufacture guidelines and slits placed to help with postoperative fluid management.  The ADM was then sutured to the inferior and lateral edge of the inframammary fold with 2-0 PDS starting with an interrupted stitch and then a running stitch.  The lateral portion was sutured to with interrupted sutures after the expander was placed.  The expander was prepared according to the manufacture guidelines,  the air evacuated and then it was placed under the ADM and pectoralis major muscle.  The inferior and lateral tabs were used to secure the expander to the chest wall with 2-0 PDS.  The drain was placed at the inframammary fold over the ADM and  secured to the skin with 3-0 Silk.   The deep layers were closed with 3-0 Monocryl followed by 4-0 Monocryl.  The skin was closed with 5-0 Monocryl and then dermabond was applied. The ABDs and breast binder were placed.  The patient tolerated the procedure well and there were no complications.  The patient was allowed to wake from anesthesia and taken to the recovery room in satisfactory condition.

## 2015-12-12 NOTE — Progress Notes (Signed)
Pt admitted to the unit from PACU. Pt is stable, alert and oriented per baseline. Oriented to room, staff, and call bell. Educated to call for any assistance. Bed in lowest position, call bell within reach- will continue to monitor.

## 2015-12-12 NOTE — Op Note (Signed)
Patient Name:           Bonnie Brown   Date of Surgery:        12/12/2015  Pre op Diagnosis:      Breast cancer lower inner quadrant left breast                                      History of external beam  mantle radiation for Hodgkin's disease, remote.  Post op Diagnosis:    Same   Procedure:                 Inject blue dye left breast                                      Left total skin sparing  mastectomy                                      Left axillary sentinel node biopsy                                      Right prophylactic total skin sparing mastectomy  Surgeon:                     Edsel Petrin. Dalbert Batman, M.D., FACS  Assistant:                      Ralene Ok, M.D., [facility]  Operative Indications:    This is a 35 year old Caucasian female who returns for planning of her bilateral mastectomies.     History significant for mantle external beam radiation for Hodgkin's disease in 1992. No prior breast problems. No family history of breast disease. Imaging studies showed some calcifications in the lower inner left side an image guided biopsy showed atypical ductal hyperplasia and lobular carcinoma in situ. Subsequent left breast lumpectomy performed on September 06, 2015 showed low grade papillary ductal carcinoma in situ and lobular carcinoma in situ. DCIS was 1 mm from the inferior margin and LCIS was present at the lateral margin. Hormone receptors positive.    We discussed her case at breast conference. She saw Dr. Lindi Adie who discussed antiestrogen therapy and close surveillance. She saw Dr. Pablo Ledger who is reluctant to offer radiation therapy because of prior radiation therapy and potential damage. Discussion of bilateral mastectomies ensued and the patient decided this was what she wanted to do. She has had genetic testing is negative.     Because of her young age she wants to go ahead with bilateral mastectomy. She has seen Dr. Marla Roe.  We are going to plan  bilateral skin sparing mastectomies, left axillary sentinel node biopsy and implant-based reconstruction.     I discussed the indications, details, techniques, and numerous risk of the surgery. She is aware of the risk of bleeding, infection, skin necrosis, multiple reoperations required to get good cosmetic result, nerve damage with chronic pain or numbness, arm swelling, arm numbness, shoulder disability. She understands all these issues well. All of her questions are answered. She agrees with this plan.  Operative Findings:       Technetium 99 radionuclide was injected  into the left breast in the holding area.  The breast tissues medially were slightly fibrotic, somewhat more on the left probably scarring from the previous lumpectomy.  There was no gross tumor.  There is no abnormal skin.  I found 2 sentinel lymph nodes on the left side.  Procedure in Detail:          Following the induction of general endotracheal anesthesia a Foley catheter was placed.  Surgical timeout was performed.  Intravenous antibiotics were given.  Following alcohol prep I injected 5 mL of dilute methylene blue into the left breast, subareolar area and massaged the breast for a few minutes.  The neck, bilateral chest wall, bilateral axilla were then prepped and draped in a sterile fashion.     Using a marking pin I planned and marked symmetrical bilateral elliptical skin sparing mastectomy incisions.  I performed the right mastectomy first.  The right incisions were made.  Skin flaps were race apparently to the infraclavicular area, medially to the parasternal area, inferiorly to the upper anterior rectus sheath and laterally to latissimus dorsi muscle.  The lateral skin margin was marked with a silk suture for the pathologist.  The right  breast was dissected off of the pectoralis major and minor muscles and sent to the lab for routine histology.  The wound was irrigated with saline.  Hemostasis was excellent and achieved   with electrocautery and some metal clips.  The wound was packed loosely with saline moistened gauze.     I then performed a mirror image left sided elliptical incision and raised the skin flaps superiorly, inferiorly, medially, and laterally in a similar fashion.  I dissected up in the axilla using the neoprobe and found 2 sentinel lymph nodes.  Both were very hot.  One was blue and one was not.  There was no more radioactivity.  I dissected the left breast off of the pectoralis major and minor muscles marked the lateral skin margin with silk suture and sent the specimen to the lab.  This side was also irrigated and hemostasis was excellent and achieved in a similar fashion.  The wound was packed off.     At this point EBL total was about 100 mL.  Counts correct.  Complications none.  At this point Dr. Marla Roe scrubbed in and I scrubbed out.  She will perform bilateral reconstruction and will dictate that separately.     Edsel Petrin. Dalbert Batman, M.D., FACS General and Minimally Invasive Surgery Breast and Colorectal Surgery  12/12/2015 10:05 AM

## 2015-12-12 NOTE — H&P (Signed)
Bonnie Brown is an 35 y.o. female.   Chief Complaint: breast cancer HPI: The patient is a 35 y.o. yrs old 65 here for breast reconstruction. She felt a lesion on the left breast and went for a mammogram. She was radiated at the age of 54 yrs for hodgkin's disease (1992). After ultrasound and MRI she was diagnosed with atypical ductal hyperplasia with calcifications and lobular carcinoma in situ. She has significant ptosis of her breasts. Genetics is pending. She is 5 feet 4 inches tall and weighs 180 pounds. Preop bra = 38 D. She has two children and her husband's mother and sister have just been treated for breast cancer/BRCA positive. She is not likely to be offered any additional radiation. Her skin looks good for the previous radiation.    Past Medical History  Diagnosis Date  . Hypothyroidism   . History of blood transfusion 1995  . History of chemotherapy age 60  . Migraines   . History of hodgkin's lymphoma age 67  . Hypertension     under control with med., has been on med. since 07/2015  . Irregular heartbeat     states due to radiation treatments - no med., has not seen cardiology since 2013  . Atypical hyperplasia of left breast 08/2015  . Family history of colon cancer   . Family history of pancreatic cancer   . H/O colonoscopy   . H/O bone density study   . Cancer [facility])     left breast  . Breast disorder     +breast cancer    Past Surgical History  Procedure Laterality Date  . Lymph node biopsy  1992  . Cesarean section N/A 11/04/2012    Procedure: Primary cesarean section with delivery of baby boy at 2007. apgars 8/9.;  Surgeon: Osborne Oman, MD;  Location: [facility];  Service: Obstetrics;  Laterality: N/A;  . Wisdom tooth extraction    . Cesarean section N/A 07/30/2014    Procedure: REPEAT CESAREAN SECTION;  Surgeon: Jonnie Kind, MD;  Location: [facility];  Service: Obstetrics;  Laterality: N/A;  . Total thyroidectomy  05/17/2015  . Breast lumpectomy with  radioactive seed localization Left 09/06/2015    Procedure: BREAST LUMPECTOMY WITH RADIOACTIVE SEED LOCALIZATION;  Surgeon: Fanny Skates, MD;  Location: [place];  Service: General;  Laterality: Left;  . Breast biopsy Bilateral     Family History  Problem Relation Age of Onset  . Coronary artery disease Paternal Grandfather   . Diabetes Maternal Grandmother   . Stroke Maternal Grandmother   . Coronary artery disease Maternal Grandmother   . Pancreatic cancer Maternal Grandmother     dx in her 19s  . Coronary artery disease Father   . Hypertension Brother   . Hypertension Mother   . Colon cancer Paternal Aunt     dx in her 27s-70s  . Colon cancer Cousin     maternal first cousin dx in her 41s  . Brain cancer Cousin 5    maternal first cousin's daughter   Social History:  reports that she has never smoked. She has never used smokeless tobacco. She reports that she does not drink alcohol or use illicit drugs.  Allergies:  Allergies  Allergen Reactions  . Seldane [Terfenadine] Other (See Comments)    ADVERSE INTERACTION WITH CHEMO DRUGS  . Vancomycin Itching, Rash and Other (See Comments)    Flush Likely "RED MAN SYNDROME"   . Adhesive [Tape] Itching    Steri  Strips-SKIN IRRITATION  . Codeine Other (See Comments)    UNKNOWN  . Penicillins Rash    Has patient had a PCN reaction causing immediate rash, facial/tongue/throat swelling, SOB or lightheadedness with hypotension: No Has patient had a PCN reaction causing severe rash involving mucus membranes or skin necrosis: No Has patient had a PCN reaction that required hospitalization No Has patient had a PCN reaction occurring within the last 10 years: No If all of the above answers are "NO", then may proceed with Cephalosporin use.     Medications Prior to Admission  Medication Sig Dispense Refill  . cetirizine (ZYRTEC) 10 MG tablet Take 10 mg by mouth daily as needed (for sinus infection).    .  hydrochlorothiazide (MICROZIDE) 12.5 MG capsule TAKE 1 CAPSULE BY MOUTH DAILY 30 capsule 3  . levothyroxine (SYNTHROID, LEVOTHROID) 125 MCG tablet TAKE 1 TABLET BY MOUTH DAILY 30 tablet 3  . Prenatal Vit-Fe Fumarate-FA (PRENATAL VITAMIN PO) Take 2 tablets by mouth daily.     . vitamin C (ASCORBIC ACID) 500 MG tablet Take 500 mg by mouth daily.      No results found for this or any previous visit (from the past 48 hour(s)). No results found.  Review of Systems  Constitutional: Negative.   HENT: Negative.   Eyes: Negative.   Respiratory: Negative.   Cardiovascular: Negative.   Gastrointestinal: Negative.   Genitourinary: Negative.   Musculoskeletal: Negative.   Skin: Negative.   Neurological: Negative.   Psychiatric/Behavioral: Negative.     Blood pressure 158/94, pulse 102, temperature 98.8 F (37.1 C), resp. rate 18, height '5\' 4"'  (1.626 m), weight 83.008 kg (183 lb), last menstrual period 12/04/2015, SpO2 100 %, not currently breastfeeding. Physical Exam  Constitutional: She is oriented to person, place, and time. She appears well-developed and well-nourished.  HENT:  Head: Normocephalic and atraumatic.  Eyes: Conjunctivae and EOM are normal. Pupils are equal, round, and reactive to light.  Cardiovascular: Normal rate.   Respiratory: Effort normal. No respiratory distress. She has no wheezes.  GI: Soft. She exhibits no distension. There is no tenderness.  Neurological: She is alert and oriented to person, place, and time.  Skin: Skin is warm.  Psychiatric: She has a normal mood and affect. Her behavior is normal. Judgment and thought content normal.     Assessment/Plan Once all reconstruction options were presented, a focused discussion was had regarding the patient's suitability for each of these procedures. Plan for bilateral immediate breast reconstruction with expander and FlexHD placement. We discussed the risks are higher for her due to her previous radiation history.     Wallace Going, DO 12/12/2015, 7:18 AM

## 2015-12-12 NOTE — Transfer of Care (Signed)
Immediate Anesthesia Transfer of Care Note  Patient: Bonnie Brown  Procedure(s) Performed: Procedure(s): LEFT TOTAL MASTECTOMY WITH LEFT AXILLARY SENTINEL LYMPH NODE BIOPSY AND RIGHT PROPHYLACTIC MASTECTOMY (Bilateral) BILATERAL BREAST RECONSTRUCTION WITH PLACEMENT OF TISSUE EXPANDERS AND FLEX HD (ACELLULAR HYDRATED DERMIS) (Bilateral)  Patient Location: PACU  Anesthesia Type:General  Level of Consciousness: awake, alert  and oriented  Airway & Oxygen Therapy: Patient Spontanous Breathing and Patient connected to nasal cannula oxygen  Post-op Assessment: Report given to RN and Post -op Vital signs reviewed and stable  Post vital signs: Reviewed and stable  Last Vitals:  Filed Vitals:   12/12/15 0640 12/12/15 0642  BP:  158/94  Pulse:  102  Temp: 37.1 C   Resp: 18     Last Pain: There were no vitals filed for this visit.    Patients Stated Pain Goal: 3 ([id number])  Complications: No apparent anesthesia complications

## 2015-12-12 NOTE — Anesthesia Postprocedure Evaluation (Signed)
Anesthesia Post Note  Patient: Bonnie Brown  Procedure(s) Performed: Procedure(s) (LRB): LEFT TOTAL MASTECTOMY WITH LEFT AXILLARY SENTINEL LYMPH NODE BIOPSY AND RIGHT PROPHYLACTIC MASTECTOMY (Bilateral) BILATERAL BREAST RECONSTRUCTION WITH PLACEMENT OF TISSUE EXPANDERS AND FLEX HD (ACELLULAR HYDRATED DERMIS) (Bilateral)  Patient location during evaluation: PACU Anesthesia Type: General Level of consciousness: awake and alert Pain management: pain level controlled Vital Signs Assessment: post-procedure vital signs reviewed and stable Respiratory status: spontaneous breathing, nonlabored ventilation, respiratory function stable and patient connected to nasal cannula oxygen Cardiovascular status: blood pressure returned to baseline and stable Postop Assessment: no signs of nausea or vomiting Anesthetic complications: no    Last Vitals:  Filed Vitals:   12/12/15 1300 12/12/15 1413  BP: 113/74 129/71  Pulse: 99 105  Temp: 36.2 C   Resp: 16     Last Pain:  Filed Vitals:   12/12/15 1533  PainSc: 7                  Francies Inch DAVID

## 2015-12-12 NOTE — Anesthesia Procedure Notes (Addendum)
Procedure Name: Intubation Date/Time: 12/12/2015 7:55 AM Performed by: Merdis Delay Pre-anesthesia Checklist: Patient identified, Timeout performed, Emergency Drugs available, Patient being monitored and Suction available Patient Re-evaluated:Patient Re-evaluated prior to inductionOxygen Delivery Method: Circle system utilized Preoxygenation: Pre-oxygenation with 100% oxygen Intubation Type: IV induction Ventilation: Mask ventilation without difficulty Laryngoscope Size: Mac and 3 Grade View: Grade I Tube type: Oral Tube size: 7.0 mm Number of attempts: 1 Airway Equipment and Method: Stylet Placement Confirmation: ETT inserted through vocal cords under direct vision,  breath sounds checked- equal and bilateral,  positive ETCO2 and CO2 detector Secured at: 22 cm Tube secured with: Tape Dental Injury: Teeth and Oropharynx as per pre-operative assessment     Anesthesia Regional Block:  Pectoralis block  Pre-Anesthetic Checklist: ,, timeout performed, Correct Patient, Correct Site, Correct Laterality, Correct Procedure, Correct Position, site marked, Risks and benefits discussed,  Surgical consent,  Pre-op evaluation,  At surgeon's request and post-op pain management  Laterality: Left  Prep: chloraprep       Needles:  Injection technique: Single-shot  Needle Type: Echogenic Needle     Needle Length: 9cm 9 cm Needle Gauge: 22 and 22 G    Additional Needles:  Procedures: ultrasound guided (picture in chart) Pectoralis block Narrative:  Start time: 12/12/2015 7:15 AM End time: 12/12/2015 7:23 AM Injection made incrementally with aspirations every 5 mL.  Performed by: Personally  Anesthesiologist: Glennon Mac, [place] Ponder  Additional Notes: Pt identified in Holding room.  Monitors applied. Working IV access confirmed. Sterile prep, drape L clavicle and pec.  #22ga ECHOgenic needle sub pec major between ribs 4,5 with US guidance.  30cc 0.5% Bupivacaine with 1:200k epi injected  incrementally after negative test dose, good spread of local.  Patient asymptomatic, VSS, no heme aspirated, tolerated well.  Jenita Seashore, MD   Anesthesia Regional Block:  Pectoralis block  Pre-Anesthetic Checklist: ,, timeout performed, Correct Patient, Correct Site, Correct Laterality, Correct Procedure, Correct Position, site marked, Risks and benefits discussed,  Surgical consent,  Pre-op evaluation,  At surgeon's request and post-op pain management  Laterality: Right  Prep: chloraprep       Needles:  Injection technique: Single-shot  Needle Type: Echogenic Needle     Needle Length: 9cm 9 cm Needle Gauge: 22 and 22 G    Additional Needles:  Procedures: ultrasound guided (picture in chart) Pectoralis block Narrative:  Start time: 12/12/2015 7:23 AM End time: 12/12/2015 7:29 AM Injection made incrementally with aspirations every 5 mL.  Performed by: Personally  Anesthesiologist: Glennon Mac, Jull Harral  Additional Notes: Pt identified in Holding room.  Monitors applied. Working IV access confirmed. Sterile prep, drape R clavicle and pec.  #22ga ECHOgenic needle sub pec major between ribs 4,5 with US guidance.  30cc 0.5% Bupivacaine with 1:200k epi injected incrementally after negative test dose, good spread of local.  Patient asymptomatic, VSS, no heme aspirated, tolerated well.  Jenita Seashore, MD

## 2015-12-12 NOTE — Interval H&P Note (Signed)
History and Physical Interval Note:  12/12/2015 6:41 AM  Bonnie Brown  has presented today for surgery, with the diagnosis of LEFT BREAST CANCER   The various methods of treatment have been discussed with the patient and family. After consideration of risks, benefits and other options for treatment, the patient has consented to  Procedure(s): LEFT TOTAL MASTECTOMY WITH LEFT AXILLARY SENTINEL LYMPH NODE BIOPSY AND RIGHT PROPHYLACTIC MASTECTOMY (Bilateral) BILATERAL BREAST RECONSTRUCTION WITH PLACEMENT OF TISSUE EXPANDER AND FLEX HD (ACELLULAR HYDRATED DERMIS) (Bilateral) as a surgical intervention .  The patient's history has been reviewed, patient examined, no change in status, stable for surgery.  I have reviewed the patient's chart and labs.  Questions were answered to the patient's satisfaction.     [name]

## 2015-12-13 ENCOUNTER — Encounter (HOSPITAL_COMMUNITY): Payer: Self-pay | Admitting: General Surgery

## 2015-12-13 MED ORDER — HYDROCODONE-ACETAMINOPHEN 5-325 MG PO TABS: 1.0000 | ORAL_TABLET | ORAL | Status: DC | PRN

## 2015-12-13 MED ORDER — DIAZEPAM 2 MG PO TABS: 2.0000 mg | ORAL_TABLET | Freq: Two times a day (BID) | ORAL | Status: DC | PRN

## 2015-12-13 MED ORDER — POLYETHYLENE GLYCOL 3350 17 G PO PACK: 17.0000 g | PACK | Freq: Every day | ORAL | Status: DC | PRN

## 2015-12-13 MED ORDER — ONDANSETRON 4 MG PO TBDP: 4.0000 mg | ORAL_TABLET | Freq: Four times a day (QID) | ORAL | Status: DC | PRN

## 2015-12-13 MED ORDER — DIPHENHYDRAMINE HCL 12.5 MG/5ML PO ELIX: 12.5000 mg | ORAL_SOLUTION | Freq: Four times a day (QID) | ORAL | Status: DC | PRN

## 2015-12-13 MED ORDER — SENNA 8.6 MG PO TABS: 1.0000 | ORAL_TABLET | Freq: Two times a day (BID) | ORAL | Status: DC

## 2015-12-13 NOTE — Discharge Instructions (Signed)
Wear breast binder or sports bra at all times except for showering  Record drainage amounts and bring to the office visits  No lifting more than 5 lbs, but you may move your arms as tolerated.   Get up and walk numerous times daily.

## 2015-12-13 NOTE — Progress Notes (Signed)
1 Day Post-Op  Subjective: Pt doing well today.  She's comfortable and ready to go home.  Objective: Vital signs in last 24 hours: Temp:  [98 F (36.7 C)-99.5 F (37.5 C)] 98 F (36.7 C) (06/06 1357) Pulse Rate:  [92-113] 99 (06/06 1357) Resp:  [18] 18 (06/06 1357) BP: (112-138)/(67-80) 138/80 mmHg (06/06 1357) SpO2:  [93 %-99 %] 99 % (06/06 1357) Last BM Date: 12/13/15  Intake/Output from previous day: 06/05 0701 - 06/06 0700 In: 4260.4 [I.V.:3860.4; IV Piggyback:400] Out: 772 [Urine:285; Drains:287; Blood:200] Intake/Output this shift: Total I/O In: 480 [P.O.:480] Out: 30 [Drains:30]  General appearance: alert and cooperative Chest wall: incisions c/d/i GI: soft, non-tender; bowel sounds normal; no masses,  no organomegaly  Lab Results:   Recent Labs  12/12/15 1313  WBC 23.5*  HGB 12.7  HCT 39.9  PLT 384   BMET  Recent Labs  12/12/15 1313  NA 137  K 4.0  CL 105  CO2 25  GLUCOSE 128*  BUN 10  CREATININE 0.69  CALCIUM 8.2*   PT/INR No results for input(s): LABPROT, INR in the last 72 hours. ABG No results for input(s): PHART, HCO3 in the last 72 hours.  Invalid input(s): PCO2, PO2  Studies/Results: Nm Sentinel Node Inj-no Rpt (breast)  12/12/2015  CLINICAL DATA: cancer left breast Sulfur colloid was injected intradermally by the nuclear medicine technologist for breast cancer sentinel node localization.    Anti-infectives: Anti-infectives    Start     Dose/Rate Route Frequency Ordered Stop   12/12/15 1400  ciprofloxacin (CIPRO) IVPB 400 mg     400 mg 200 mL/hr over 60 Minutes Intravenous Every 12 hours 12/12/15 1257     12/12/15 0838  polymyxin B 500,000 Units, bacitracin 50,000 Units in sodium chloride irrigation 0.9 % 500 mL irrigation  Status:  Discontinued       As needed 12/12/15 0838 12/12/15 1134   12/12/15 0600  ciprofloxacin (CIPRO) IVPB 400 mg     400 mg 200 mL/hr over 60 Minutes Intravenous On call to O.R. 12/11/15 1416 12/12/15  0825      Assessment/Plan: s/p Procedure(s): LEFT TOTAL MASTECTOMY WITH LEFT AXILLARY SENTINEL LYMPH NODE BIOPSY AND RIGHT PROPHYLACTIC MASTECTOMY (Bilateral) BILATERAL BREAST RECONSTRUCTION WITH PLACEMENT OF TISSUE EXPANDERS AND FLEX HD (ACELLULAR HYDRATED DERMIS) (Bilateral) Plan is home today as long as OK with Dr. Marla Roe. Path pending, will call her with results F/u with Dr. Dalbert Batman as scheduled.    LOS: 1 day    Bonnie Brown., Bonnie Brown 12/13/2015

## 2015-12-13 NOTE — Progress Notes (Signed)
Offered the Pt. A bath. Pt stated she would eat her breakfast and let it settle down and then we can go washed her up. Linens were changed

## 2015-12-13 NOTE — Progress Notes (Signed)
Discharge home. Home discharge instruction given, JP drain teaching discussed with patient and husband, verbalizes understanding. No other question verbalized.

## 2015-12-14 NOTE — Discharge Summary (Signed)
Physician Discharge Summary  Patient ID: Bonnie Brown MRN: [id number] DOB/AGE: [birth year] 35 y.o.  Admit date: 12/12/2015 Discharge date: 12/14/2015  Admission Diagnoses: Breast cancer of lower-inner quadrant of left breast  Discharge Diagnoses:  Active Problems:   Breast cancer [facility])   Discharged Condition: good  Hospital Course: BEVELY HACKBART is a 35 yo female admitted for bilateral mastectomies and immediate breast reconstruction for left breast cancer. She felt a lesion on the left breast and went for a mammogram. She was radiated at the age of 35 yrs for hodgkin's disease (1992). After ultrasound and MRI she was diagnosed with atypical ductal hyperplasia with calcifications and lobular carcinoma in situ. She has significant ptosis of her breasts.She has two children and her husband's mother and sister have just been treated for breast cancer/BRCA positive. She underwent left total mastectomy with left axillary sentinel lymph node biopsy and right prophylactic mastectomy followed by immediate bilateral reconstruction with tissue expanders and ADM. She did well following the procedure and was discharged home the following day in stable condition.     Significant Diagnostic Studies: labs: pathology pending  Treatments: surgery: Procedure:                                                     Left total skin sparing  mastectomy                                      Left axillary sentinel node biopsy                                      Right prophylactic total skin sparing mastectomy    Left - Mentor 350 cc expander with 200 cc of normal saline injected    Right - Mentor 350 cc expander with 200 cc of normal saline injected    Acellular Dermal Matrix 4 x 16 cm two    Discharge Exam: Blood pressure 138/80, pulse 99, temperature 98 F (36.7 C), temperature source Oral, resp. rate 18, height '5\' 4"'  (1.626 m), weight 83.008 kg (183 lb), last menstrual period 12/04/2015, SpO2 99 %,  not currently breastfeeding. General appearance: alert, cooperative, appears stated age and no distress Resp: clear to auscultation bilaterally Cardio: regular rate and rhythm bilateral breast flaps viable and without signs of hematoma or infection  Disposition: 01-Home or Self Care     Medication List    TAKE these medications        cetirizine 10 MG tablet  Commonly known as:  ZYRTEC  Take 10 mg by mouth daily as needed (for sinus infection).     diazepam 2 MG tablet  Commonly known as:  VALIUM  Take 1 tablet (2 mg total) by mouth every 12 (twelve) hours as needed for muscle spasms.     diphenhydrAMINE 12.5 MG/5ML elixir  Commonly known as:  BENADRYL  Take 5 mLs (12.5 mg total) by mouth every 6 (six) hours as needed for itching.     hydrochlorothiazide 12.5 MG capsule  Commonly known as:  MICROZIDE  TAKE 1 CAPSULE BY MOUTH DAILY     HYDROcodone-acetaminophen 5-325 MG tablet  Commonly known as:  NORCO/VICODIN  Take 1-2 tablets by mouth every 4 (four) hours as needed for moderate pain.     levothyroxine 125 MCG tablet  Commonly known as:  SYNTHROID, LEVOTHROID  TAKE 1 TABLET BY MOUTH DAILY     ondansetron 4 MG disintegrating tablet  Commonly known as:  ZOFRAN-ODT  Take 1 tablet (4 mg total) by mouth every 6 (six) hours as needed for nausea.     polyethylene glycol packet  Commonly known as:  MIRALAX / GLYCOLAX  Take 17 g by mouth daily as needed for mild constipation.     PRENATAL VITAMIN PO  Take 2 tablets by mouth daily.     senna 8.6 MG Tabs tablet  Commonly known as:  SENOKOT  Take 1 tablet (8.6 mg total) by mouth 2 (two) times daily.     vitamin C 500 MG tablet  Commonly known as:  ASCORBIC ACID  Take 500 mg by mouth daily.           Follow-up Information    Follow up with Wallace Going, DO In 1 week.   Specialty:  Plastic Surgery   Contact information:   [place] [address] [phone]       Follow up with  [name], MD In 2 weeks.   Specialty:  General Surgery   Contact information:   [place] [place] [address] [phone]       Signed: [facility] [phone]

## 2015-12-15 NOTE — Progress Notes (Signed)
Quick Note:  Inform patient of Pathology report,.If not done already, call her and tell her that there was no residual cancer in either breast and no cancer in the lymph nodes. Excellent news from a cancer standpoint.  I will review in detail in 2 weeks at office visit.  hmi ______

## 2015-12-19 ENCOUNTER — Telehealth: Payer: Self-pay | Admitting: Hematology and Oncology

## 2015-12-19 ENCOUNTER — Ambulatory Visit (HOSPITAL_BASED_OUTPATIENT_CLINIC_OR_DEPARTMENT_OTHER): Payer: PRIVATE HEALTH INSURANCE | Admitting: Hematology and Oncology

## 2015-12-19 ENCOUNTER — Encounter: Payer: Self-pay | Admitting: Hematology and Oncology

## 2015-12-19 VITALS — BP 130/92 | HR 100 | Temp 98.4°F | Resp 100 | Ht 64.0 in | Wt 181.0 lb

## 2015-12-19 DIAGNOSIS — D0512 Intraductal carcinoma in situ of left breast: Secondary | ICD-10-CM | POA: Diagnosis not present

## 2015-12-19 DIAGNOSIS — C50312 Malignant neoplasm of lower-inner quadrant of left female breast: Secondary | ICD-10-CM

## 2015-12-19 NOTE — Telephone Encounter (Signed)
appt made and avs printed °

## 2015-12-19 NOTE — Assessment & Plan Note (Signed)
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 Return to clinic in 1 year for surveillance checks with survivorship clinic.

## 2015-12-19 NOTE — Progress Notes (Signed)
Patient Care Team: Arsenio Katz, NP as PCP - General (Nurse Practitioner)   SUMMARY OF ONCOLOGIC HISTORY:   Breast cancer of lower-inner quadrant of left female breast ([place])   07/26/2015 Mammogram Grouped faint segmental heterogeneous calcifications spanning 5.5 cm   08/16/2015 Initial Diagnosis Left breast biopsy: Atypical ductal hyperplasia with associated calcifications, LCIS   09/06/2015 Surgery Left lumpectomy: Papillary DCIS 2 cm in size, ER 95%, PR 70% with LCIS, DCIS focally 0.1 cm from inferior margin, LCIS focally at lateral margin, Tis N0 stage 0   10/19/2015 Procedure NORMAL GENETICS (32 genes: APC, ATM, AXIN2, BARD1, BMPR1A, BRCA1, BRCA2, BRIP1, CDH1, CDK4, CDKN2A, CHEK2, EPCAM, FANCC, MLH1, MSH2, MSH6, MUTYH, NBN, PALB2, PMS2, POLD1, POLE, PTEN, RAD51C, RAD51D, SCG5/GREM1, SMAD4, STK11, TP53, VHL, and XRCC2).   12/12/2015 Surgery Bilateral mastectomies: Right mastectomy: Fibroadenoma with fibrocystic changes no malignancy; left mastectomy: Fibrocystic changes no malignancy, 0/2 left sentinel nodes    CHIEF COMPLIANT: Follow-up after recent bilateral mastectomies  INTERVAL HISTORY: Bonnie Brown is a 35 year old with above-mentioned history of left lumpectomy which revealed papillary DCIS but there were issues with margins and she had to undergo MRIs which showed additional abnormalities. Patient decided to undergo bilateral mastectomies and still going through more and more biopsies. She had no abnormal genetic mutations. She is recovering fairly well from surgery. She is more uncomfortable than in pain.  REVIEW OF SYSTEMS:   Constitutional: Denies fevers, chills or abnormal weight loss Eyes: Denies blurriness of vision Ears, nose, mouth, throat, and face: Denies mucositis or sore throat Respiratory: Denies cough, dyspnea or wheezes Cardiovascular: Denies palpitation, chest discomfort Gastrointestinal:  Denies nausea, heartburn or change in bowel habits Skin: Denies abnormal  skin rashes Lymphatics: Denies new lymphadenopathy or easy bruising Neurological:Denies numbness, tingling or new weaknesses Behavioral/Psych: Mood is stable, no new changes  Extremities: No lower extremity edema Breast: Recent bilateral mastectomies All other systems were reviewed with the patient and are negative.  I have reviewed the past medical history, past surgical history, social history and family history with the patient and they are unchanged from previous note.  ALLERGIES:  is allergic to seldane; vancomycin; adhesive; codeine; and penicillins.  MEDICATIONS:  Current Outpatient Prescriptions  Medication Sig Dispense Refill  . cetirizine (ZYRTEC) 10 MG tablet Take 10 mg by mouth daily as needed (for sinus infection).    . diazepam (VALIUM) 2 MG tablet Take 1 tablet (2 mg total) by mouth every 12 (twelve) hours as needed for muscle spasms. 30 tablet 0  . diphenhydrAMINE (BENADRYL) 12.5 MG/5ML elixir Take 5 mLs (12.5 mg total) by mouth every 6 (six) hours as needed for itching. 120 mL 0  . hydrochlorothiazide (MICROZIDE) 12.5 MG capsule TAKE 1 CAPSULE BY MOUTH DAILY 30 capsule 3  . HYDROcodone-acetaminophen (NORCO/VICODIN) 5-325 MG tablet Take 1-2 tablets by mouth every 4 (four) hours as needed for moderate pain. 30 tablet 0  . levothyroxine (SYNTHROID, LEVOTHROID) 125 MCG tablet TAKE 1 TABLET BY MOUTH DAILY 30 tablet 3  . ondansetron (ZOFRAN-ODT) 4 MG disintegrating tablet Take 1 tablet (4 mg total) by mouth every 6 (six) hours as needed for nausea. 20 tablet 0  . polyethylene glycol (MIRALAX / GLYCOLAX) packet Take 17 g by mouth daily as needed for mild constipation. 14 each 0  . Prenatal Vit-Fe Fumarate-FA (PRENATAL VITAMIN PO) Take 2 tablets by mouth daily.     Marland Kitchen senna (SENOKOT) 8.6 MG TABS tablet Take 1 tablet (8.6 mg total) by mouth 2 (two) times daily. [place]  each 0  . vitamin C (ASCORBIC ACID) 500 MG tablet Take 500 mg by mouth daily.     No current facility-administered  medications for this visit.    PHYSICAL EXAMINATION: ECOG PERFORMANCE STATUS: 1 - Symptomatic but completely ambulatory  Filed Vitals:   12/19/15 1121  BP: 130/92  Pulse: 100  Temp: 98.4 F (36.9 C)  Resp: 100   Filed Weights   12/19/15 1121  Weight: 181 lb (82.101 kg)    GENERAL:alert, no distress and comfortable SKIN: skin color, texture, turgor are normal, no rashes or significant lesions EYES: normal, Conjunctiva are pink and non-injected, sclera clear OROPHARYNX:no exudate, no erythema and lips, buccal mucosa, and tongue normal  NECK: supple, thyroid normal size, non-tender, without nodularity LYMPH:  no palpable lymphadenopathy in the cervical, axillary or inguinal LUNGS: clear to auscultation and percussion with normal breathing effort HEART: regular rate & rhythm and no murmurs and no lower extremity edema ABDOMEN:abdomen soft, non-tender and normal bowel sounds MUSCULOSKELETAL:no cyanosis of digits and no clubbing  NEURO: alert & oriented x 3 with fluent speech, no focal motor/sensory deficits EXTREMITIES: No lower extremity edema  LABORATORY DATA:  I have reviewed the data as listed   Chemistry      Component Value Date/Time   NA 137 12/12/2015 1313   NA 141 07/12/2015 1632   K 4.0 12/12/2015 1313   CL 105 12/12/2015 1313   CO2 25 12/12/2015 1313   BUN 10 12/12/2015 1313   BUN 11 07/12/2015 1632   CREATININE 0.69 12/12/2015 1313   CREATININE 0.50 04/26/2014 1638      Component Value Date/Time   CALCIUM 8.2* 12/12/2015 1313   ALKPHOS 74 12/06/2015 0836   AST 19 12/06/2015 0836   ALT 15 12/06/2015 0836   BILITOT 0.6 12/06/2015 0836   BILITOT <0.2 07/12/2015 1632       Lab Results  Component Value Date   WBC 23.5* 12/12/2015   HGB 12.7 12/12/2015   HCT 39.9 12/12/2015   MCV 89.3 12/12/2015   PLT 384 12/12/2015   NEUTROABS 5.7 12/06/2015     ASSESSMENT & PLAN:  Breast cancer of lower-inner quadrant of left female breast ([place]) Left  lumpectomy 09/06/2015: Papillary DCIS 2 cm in size, ER 95%, PR 70% with LCIS, DCIS focally 0.1 cm from inferior margin, LCIS focally at lateral margin, Tis N0 stage 0 Prior history of Hodgkin's lymphoma with spleen involvement at age 72 status post chemotherapy followed by mantle radiation  Bilateral mastectomies 12/12/2015: Right mastectomy: Fibroadenoma with fibrocystic changes no malignancy; left mastectomy: Fibrocystic changes no malignancy, 0/2 left sentinel nodes  Pathology review: I discussed the final pathology report and I recommended no further treatment. Return to clinic in 6 months for surveillance checks with survivorship clinic.   No orders of the defined types were placed in this encounter.   The patient has a good understanding of the overall plan. she agrees with it. she will call with any problems that may develop before the next visit here.   Rulon Eisenmenger, MD 12/19/2015

## 2016-01-04 ENCOUNTER — Ambulatory Visit: Payer: PRIVATE HEALTH INSURANCE | Attending: General Surgery | Admitting: Physical Therapy

## 2016-01-04 DIAGNOSIS — Z483 Aftercare following surgery for neoplasm: Secondary | ICD-10-CM

## 2016-01-04 DIAGNOSIS — M25612 Stiffness of left shoulder, not elsewhere classified: Secondary | ICD-10-CM | POA: Insufficient documentation

## 2016-01-04 DIAGNOSIS — M25611 Stiffness of right shoulder, not elsewhere classified: Secondary | ICD-10-CM | POA: Insufficient documentation

## 2016-01-04 NOTE — Patient Instructions (Signed)
Flexors Stick Stretch I    Stand or sit, dowel in palm of arm to be stretched. Other arm, holding dowel at side and behind body, pushes arm being stretched forward and upward until straight over head. Hold _5-10__ seconds. Repeat _5__ times per session. Do _2-3__ sessions per day.  Copyright  VHI. All rights reserved.  Inferior Capsule Stick Stretch I    Stand or sit, dowel in palm of arm to be stretched. Other arm, holding dowel in front of body, pushes outward and upward until arm being stretched is as high to the side as possible. Hold _5-10__ seconds. Repeat __5_ times per session. Do _2-3__ sessions per day.  Copyright  VHI. All rights reserved.  Internal Rotator Cuff Stick Stretch III, Standing (Passive)    Stand or sit, dowel in palm of arm to be stretched. Other arm, holding dowel at side and behind body, pushes in a large diagonal arc upward and backward until arm being stretched is in 90/90 of shoulder abduction and elbow flexion. Hold _5-10__ seconds. Repeat _5__ times per session. Do _2-3__ sessions per day.  Copyright  VHI. All rights reserved.

## 2016-01-04 NOTE — Therapy (Signed)
[place], [place], [address] Phone: [phone]   Fax:  [phone]  Physical Therapy Evaluation  Patient Details  Name: Bonnie Brown MRN: [id number] Date of Birth: [birth year] Referring Provider: Dr. Fanny Skates  Encounter Date: 01/04/2016      PT End of Session - 01/04/16 1719    Visit Number 1   Number of Visits 13   Date for PT Re-Evaluation 02/02/16   PT Start Time 0937   PT Stop Time 1018   PT Time Calculation (min) 41 min   Activity Tolerance Patient tolerated treatment well   Behavior During Therapy [facility] for tasks assessed/performed      Past Medical History  Diagnosis Date  . Hypothyroidism   . History of blood transfusion 1995  . History of chemotherapy age 5  . Migraines   . History of hodgkin's lymphoma age 71  . Hypertension     under control with med., has been on med. since 07/2015  . Irregular heartbeat     states due to radiation treatments - no med., has not seen cardiology since 2013  . Atypical hyperplasia of left breast 08/2015  . Family history of colon cancer   . Family history of pancreatic cancer   . H/O colonoscopy   . H/O bone density study   . Cancer [facility])     left breast  . Breast disorder     +breast cancer    Past Surgical History  Procedure Laterality Date  . Lymph node biopsy  1992  . Cesarean section N/A 11/04/2012    Procedure: Primary cesarean section with delivery of baby boy at 2007. apgars 8/9.;  Surgeon: Osborne Oman, MD;  Location: [facility];  Service: Obstetrics;  Laterality: N/A;  . Wisdom tooth extraction    . Cesarean section N/A 07/30/2014    Procedure: REPEAT CESAREAN SECTION;  Surgeon: Jonnie Kind, MD;  Location: [facility];  Service: Obstetrics;  Laterality: N/A;  . Total thyroidectomy  05/17/2015  . Breast lumpectomy with radioactive seed localization Left 09/06/2015    Procedure: BREAST LUMPECTOMY WITH RADIOACTIVE SEED  LOCALIZATION;  Surgeon: Fanny Skates, MD;  Location: [facility];  Service: General;  Laterality: Left;  . Breast biopsy Bilateral   . Mastectomy w/ sentinel node biopsy Bilateral 12/12/2015    Procedure: LEFT TOTAL MASTECTOMY WITH LEFT AXILLARY SENTINEL LYMPH NODE BIOPSY AND RIGHT PROPHYLACTIC MASTECTOMY;  Surgeon: Fanny Skates, MD;  Location: [facility];  Service: General;  Laterality: Bilateral;  . Breast reconstruction with placement of tissue expander and flex hd (acellular hydrated dermis) Bilateral 12/12/2015    Procedure: BILATERAL BREAST RECONSTRUCTION WITH PLACEMENT OF TISSUE EXPANDERS AND FLEX HD (ACELLULAR HYDRATED DERMIS);  Surgeon: Loel Lofty Dillingham, DO;  Location: [facility];  Service: Plastics;  Laterality: Bilateral;    There were no vitals filed for this visit.       Subjective Assessment - 01/04/16 0944    Subjective "I just had a bilateral mastectomy on June 5th.  I had early stages of breast cancer and I chose to go ahead and have a mastectomy."   Pertinent History Early stage left breast cancer with bilateral mastectomies, left SLNB (4 nodes removed) on 12/12/15, all negative.  Had immediate reconstruction with expanders in place.  Has had two fills already.  No date for that surgery.  HTN controlled with meds.  Thyroid resected last November with precancer, and patient has a visible incision at anterior neck  from that.   Patient Stated Goals Be able to use the left arm better.   Currently in Pain? No/denies            [facility] PT Assessment - 01/04/16 0001    Assessment   Medical Diagnosis left breast cancer with bilateral mastectomies and SLNB   Referring Provider Dr. Fanny Skates   Onset Date/Surgical Date 12/12/15   Precautions   Precautions Other (comment)   Precaution Comments cancer precautions   Restrictions   Weight Bearing Restrictions No   Balance Screen   Has the patient fallen in the past 6 months No   Has the patient had a decrease in  activity level because of a fear of falling?  No   Is the patient reluctant to leave their home because of a fear of falling?  No   Home Environment   Living Environment Private residence   Living Arrangements Children;Spouse/significant other  26 and 55 year-old children   Type of [place] Laundry or work area in basement   Prior Function   Level of Independence Independent   Vocation Full time employment  out now; may go back in about a week   Actor, Warehouse manager at [employer] no regular exercise; used to walk and run a little bit; also has weights in the basement   Cognition   Overall Cognitive Status Within Functional Limits for tasks assessed   Observation/Other Assessments   Skin Integrity bilateral mastectomy incisions are healing very well; has bandages over drainsites, which were not viewed today   ROM / Strength   AROM / PROM / Strength AROM;PROM   AROM   AROM Assessment Site Shoulder   Right/Left Shoulder Right;Left   Right Shoulder Flexion 102 Degrees  in sitting   Right Shoulder ABduction 77 Degrees   Left Shoulder Flexion 103 Degrees   Left Shoulder ABduction 70 Degrees   PROM   PROM Assessment Site Shoulder   Right/Left Shoulder Right;Left   Right Shoulder Flexion 120 Degrees  supine   Right Shoulder ABduction 61 Degrees   Right Shoulder Internal Rotation --  WFL   Right Shoulder External Rotation 55 Degrees   Left Shoulder Flexion 103 Degrees   Left Shoulder ABduction 55 Degrees   Left Shoulder Internal Rotation --  [facility]   Left Shoulder External Rotation 36 Degrees                   [facility] Adult PT Treatment/Exercise - 01/04/16 0001    Exercises   Exercises Shoulder   Shoulder Exercises: Standing   Other Standing Exercises Instructed in dowel exercises for each shoulder for flexion, abduction, and ER.                PT Education - 01/04/16 1719    Education provided Yes    Education Details dowel exercise for right and left shoulder flexion, abduction, ER   Person(s) Educated Patient   Methods Explanation;Demonstration;Handout;Verbal cues   Comprehension Verbalized understanding;Returned demonstration;Need further instruction                [facility] Goals - 01/04/16 1725    CC Long Term Goal  #1   Title Patient will show active shoulder flexion of at least 140 degrees bilat. for improved overhead reach   Time 4   Period Weeks   Status New   CC Long Term Goal  #2   Title Active  shoulder abduction bilateral of at least 120 degrees for improved ADLs   Time 4   Period Weeks   Status New   CC Long Term Goal  #3   Title Patient will demonstrate ability to perform simulated work tasks indicating she may be able to return to her job   Time [place] - 01/04/16 1720    Clinical Impression Statement Patient is s/p bilateral mastectomies with immediate reconstruction for left breast cancer with SLNB on 12/12/15.  She is still fairly soon after surgery but with significantly limited A/ROM and P/ROM of both shoulders. Bilateral shoulder strength is grossly 3-/5 and function is impaired.  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  She will need intensive therapy to reach that goal.    Rehab Potential Excellent   PT Frequency 3x / week   PT Duration 2 weeks  to 4 weeks prn   PT Treatment/Interventions ADLs/Self Care Home Management;Electrical Stimulation;DME Instruction;Therapeutic exercise;Patient/family education;Manual techniques;Passive range of motion;Taping   PT Next Visit Plan Review HEP given today.  Begin P/AA/A/ROM for both shoulders.   PT Home Exercise Plan dowel exercises for both shoulder ROM   Consulted and Agree with Plan of Care Patient      Patient will benefit from skilled therapeutic intervention in order to improve the  following deficits and impairments:  Decreased range of motion, Decreased strength, Impaired UE functional use  Visit Diagnosis: Stiffness of right shoulder, not elsewhere classified - Plan: PT plan of care cert/re-cert  Stiffness of left shoulder, not elsewhere classified - Plan: PT plan of care cert/re-cert  Aftercare following surgery for neoplasm - Plan: PT plan of care cert/re-cert     Problem List Patient Active Problem List   Diagnosis Date Noted  . Genetic testing 11/01/2015  . Family history of colon cancer   . Family history of pancreatic cancer   . Breast cancer of lower-inner quadrant of left female breast ([place]) 09/13/2015  . Left breast mass 07/12/2015  . H/O total thyroidectomy 06/20/2015  . Encounter for contraceptive management 04/28/2015  . Enlarged thyroid 09/06/2014  . Skin irritation 08/16/2014  . Status post cesarean delivery 07/30/2014  . Left sciatic nerve pain 04/26/2014  . Previous cesarean delivery, antepartum 12/30/2013  . Supervision of other high-risk pregnancy 12/30/2013  . History of Hodgkin's disease 11/24/2012  . Hypothyroidism     SALISBURY,DONNA 01/04/2016, 5:31 PM  [place], [place], [address] Phone: [phone]   Fax:  [phone]  Name: Bonnie Brown MRN: [id number] Date of Birth: 1980/11/23   Serafina Royals, PT 01/04/2016 5:31 PM

## 2016-01-06 ENCOUNTER — Ambulatory Visit: Payer: PRIVATE HEALTH INSURANCE | Admitting: Physical Therapy

## 2016-01-06 ENCOUNTER — Encounter: Payer: Self-pay | Admitting: Physical Therapy

## 2016-01-06 DIAGNOSIS — M25611 Stiffness of right shoulder, not elsewhere classified: Secondary | ICD-10-CM

## 2016-01-06 DIAGNOSIS — M25612 Stiffness of left shoulder, not elsewhere classified: Secondary | ICD-10-CM

## 2016-01-06 NOTE — Therapy (Signed)
[place], [place], [address] Phone: [phone]   Fax:  [phone]  Physical Therapy Treatment  Patient Details  Name: Bonnie Brown MRN: [id number] Date of Birth: [birth year] Referring Provider: Dr. Fanny Skates  Encounter Date: 01/06/2016      PT End of Session - 01/06/16 1156    Visit Number 2   Number of Visits 13   Date for PT Re-Evaluation 02/02/16   PT Start Time 1106   PT Stop Time 1150   PT Time Calculation (min) 44 min   Activity Tolerance Patient tolerated treatment well   Behavior During Therapy [facility] for tasks assessed/performed      Past Medical History  Diagnosis Date  . Hypothyroidism   . History of blood transfusion 1995  . History of chemotherapy age 12  . Migraines   . History of hodgkin's lymphoma age 48  . Hypertension     under control with med., has been on med. since 07/2015  . Irregular heartbeat     states due to radiation treatments - no med., has not seen cardiology since 2013  . Atypical hyperplasia of left breast 08/2015  . Family history of colon cancer   . Family history of pancreatic cancer   . H/O colonoscopy   . H/O bone density study   . Cancer [facility])     left breast  . Breast disorder     +breast cancer    Past Surgical History  Procedure Laterality Date  . Lymph node biopsy  1992  . Cesarean section N/A 11/04/2012    Procedure: Primary cesarean section with delivery of baby boy at 2007. apgars 8/9.;  Surgeon: Osborne Oman, MD;  Location: [facility];  Service: Obstetrics;  Laterality: N/A;  . Wisdom tooth extraction    . Cesarean section N/A 07/30/2014    Procedure: REPEAT CESAREAN SECTION;  Surgeon: Jonnie Kind, MD;  Location: [facility];  Service: Obstetrics;  Laterality: N/A;  . Total thyroidectomy  05/17/2015  . Breast lumpectomy with radioactive seed localization Left 09/06/2015    Procedure: BREAST LUMPECTOMY WITH RADIOACTIVE SEED  LOCALIZATION;  Surgeon: Fanny Skates, MD;  Location: [facility];  Service: General;  Laterality: Left;  . Breast biopsy Bilateral   . Mastectomy w/ sentinel node biopsy Bilateral 12/12/2015    Procedure: LEFT TOTAL MASTECTOMY WITH LEFT AXILLARY SENTINEL LYMPH NODE BIOPSY AND RIGHT PROPHYLACTIC MASTECTOMY;  Surgeon: Fanny Skates, MD;  Location: [facility];  Service: General;  Laterality: Bilateral;  . Breast reconstruction with placement of tissue expander and flex hd (acellular hydrated dermis) Bilateral 12/12/2015    Procedure: BILATERAL BREAST RECONSTRUCTION WITH PLACEMENT OF TISSUE EXPANDERS AND FLEX HD (ACELLULAR HYDRATED DERMIS);  Surgeon: Loel Lofty Dillingham, DO;  Location: [facility];  Service: Plastics;  Laterality: Bilateral;    There were no vitals filed for this visit.      Subjective Assessment - 01/06/16 1107    Subjective My exercises went pretty well. My three year old son was helping me. My arm doesn't seem as tight as it was and driving is not bothering me as bad.    Pertinent History Early stage left breast cancer with bilateral mastectomies, left SLNB (4 nodes removed) on 12/12/15, all negative.  Had immediate reconstruction with expanders in place.  Has had two fills already.  No date for that surgery.  HTN controlled with meds.  Thyroid resected last November with precancer, and patient has a visible incision  at anterior neck from that.   Patient Stated Goals Be able to use the left arm better.   Currently in Pain? No/denies                         [facility] Adult PT Treatment/Exercise - 01/06/16 0001    Manual Therapy   Manual Therapy Passive ROM;Myofascial release   Myofascial Release bilateral UE pulling while moving arm into abduction   Passive ROM to bilateral UEs into flexion, abduction, and ER to pt's tolerance. Left arm was tighter than Rt but loosened following PROM                        [facility] Goals - 01/04/16 1725     CC Long Term Goal  #1   Title Patient will show active shoulder flexion of at least 140 degrees bilat. for improved overhead reach   Time 4   Period Weeks   Status New   CC Long Term Goal  #2   Title Active shoulder abduction bilateral of at least 120 degrees for improved ADLs   Time 4   Period Weeks   Status New   CC Long Term Goal  #3   Title Patient will demonstrate ability to perform simulated work tasks indicating she may be able to return to her job   Time 4   Period Weeks   Status New            Plan - 01/06/16 1156    Clinical Impression Statement Pt was very tight prior to therapy today. Her left shoulder ROM is more limited than her right. PROM and myofascial release performed today with improvement in ROM noted and pt stated it felt better at end of session. She has been compliant with her home exercises and does not have any questions at this time. con   Rehab Potential Excellent   PT Frequency 3x / week   PT Duration 2 weeks   PT Treatment/Interventions ADLs/Self Care Home Management;Electrical Stimulation;DME Instruction;Therapeutic exercise;Patient/family education;Manual techniques;Passive range of motion;Taping   PT Next Visit Plan continue P/AA/AROM for both shoulders   PT Home Exercise Plan dowel exercises for both shoulder ROM   Consulted and Agree with Plan of Care Patient      Patient will benefit from skilled therapeutic intervention in order to improve the following deficits and impairments:  Decreased range of motion, Decreased strength, Impaired UE functional use  Visit Diagnosis: Stiffness of right shoulder, not elsewhere classified  Stiffness of left shoulder, not elsewhere classified     Problem List Patient Active Problem List   Diagnosis Date Noted  . Genetic testing 11/01/2015  . Family history of colon cancer   . Family history of pancreatic cancer   . Breast cancer of lower-inner quadrant of left female breast ([place]) 09/13/2015  .  Left breast mass 07/12/2015  . H/O total thyroidectomy 06/20/2015  . Encounter for contraceptive management 04/28/2015  . Enlarged thyroid 09/06/2014  . Skin irritation 08/16/2014  . Status post cesarean delivery 07/30/2014  . Left sciatic nerve pain 04/26/2014  . Previous cesarean delivery, antepartum 12/30/2013  . Supervision of other high-risk pregnancy 12/30/2013  . History of Hodgkin's disease 11/24/2012  . Hypothyroidism     Alexia Freestone 01/06/2016, 11:59 AM  [place], [place], [address] Phone: [phone]   Fax:  [phone]  Name: Bonnie Boniface  Brown MRN: [id number] Date of Birth: October 21, 1980    Allyson Sabal, PT 01/06/2016 11:59 AM

## 2016-01-09 ENCOUNTER — Ambulatory Visit: Payer: PRIVATE HEALTH INSURANCE | Attending: General Surgery

## 2016-01-09 DIAGNOSIS — M25611 Stiffness of right shoulder, not elsewhere classified: Secondary | ICD-10-CM | POA: Diagnosis present

## 2016-01-09 DIAGNOSIS — Z483 Aftercare following surgery for neoplasm: Secondary | ICD-10-CM | POA: Diagnosis present

## 2016-01-09 DIAGNOSIS — M25612 Stiffness of left shoulder, not elsewhere classified: Secondary | ICD-10-CM

## 2016-01-09 NOTE — Therapy (Signed)
[place], [place], [address] Phone: [phone]   Fax:  [phone]  Physical Therapy Treatment  Patient Details  Name: Bonnie Brown MRN: [id number] Date of Birth: [birth year] Referring Provider: Dr. Fanny Skates  Encounter Date: 01/09/2016      PT End of Session - 01/09/16 0843    Visit Number 3   Number of Visits 13   Date for PT Re-Evaluation 02/02/16   PT Start Time 0802   PT Stop Time 0846   PT Time Calculation (min) 44 min   Activity Tolerance Patient tolerated treatment well   Behavior During Therapy [facility] for tasks assessed/performed      Past Medical History  Diagnosis Date  . Hypothyroidism   . History of blood transfusion 1995  . History of chemotherapy age 63  . Migraines   . History of hodgkin's lymphoma age 31  . Hypertension     under control with med., has been on med. since 07/2015  . Irregular heartbeat     states due to radiation treatments - no med., has not seen cardiology since 2013  . Atypical hyperplasia of left breast 08/2015  . Family history of colon cancer   . Family history of pancreatic cancer   . H/O colonoscopy   . H/O bone density study   . Cancer [facility])     left breast  . Breast disorder     +breast cancer    Past Surgical History  Procedure Laterality Date  . Lymph node biopsy  1992  . Cesarean section N/A 11/04/2012    Procedure: Primary cesarean section with delivery of baby boy at 2007. apgars 8/9.;  Surgeon: Osborne Oman, MD;  Location: [place] [facility];  Service: Obstetrics;  Laterality: N/A;  . Wisdom tooth extraction    . Cesarean section N/A 07/30/2014    Procedure: REPEAT CESAREAN SECTION;  Surgeon: Jonnie Kind, MD;  Location: [place] [facility];  Service: Obstetrics;  Laterality: N/A;  . Total thyroidectomy  05/17/2015  . Breast lumpectomy with radioactive seed localization Left 09/06/2015    Procedure: BREAST LUMPECTOMY WITH RADIOACTIVE SEED  LOCALIZATION;  Surgeon: Fanny Skates, MD;  Location: [place];  Service: General;  Laterality: Left;  . Breast biopsy Bilateral   . Mastectomy w/ sentinel node biopsy Bilateral 12/12/2015    Procedure: LEFT TOTAL MASTECTOMY WITH LEFT AXILLARY SENTINEL LYMPH NODE BIOPSY AND RIGHT PROPHYLACTIC MASTECTOMY;  Surgeon: Fanny Skates, MD;  Location: [place];  Service: General;  Laterality: Bilateral;  . Breast reconstruction with placement of tissue expander and flex hd (acellular hydrated dermis) Bilateral 12/12/2015    Procedure: BILATERAL BREAST RECONSTRUCTION WITH PLACEMENT OF TISSUE EXPANDERS AND FLEX HD (ACELLULAR HYDRATED DERMIS);  Surgeon: Loel Lofty Dillingham, DO;  Location: [place];  Service: Plastics;  Laterality: Bilateral;    There were no vitals filed for this visit.      Subjective Assessment - 01/09/16 0805    Subjective I felt really good after last visit. No new complaints.    Pertinent History Early stage left breast cancer with bilateral mastectomies, left SLNB (4 nodes removed) on 12/12/15, all negative.  Had immediate reconstruction with expanders in place.  Has had two fills already.  No date for that surgery.  HTN controlled with meds.  Thyroid resected last November with precancer, and patient has a visible incision at anterior neck from that.   Patient Stated Goals Be able to use the left arm better.  Currently in Pain? No/denies                         [facility] Adult PT Treatment/Exercise - 01/09/16 0001    Shoulder Exercises: Pulleys   Flexion 2 minutes   ABduction 2 minutes   Shoulder Exercises: Therapy Ball   Flexion 10 reps   Manual Therapy   Manual Therapy Passive ROM;Myofascial release   Myofascial Release bilateral UE pulling while moving arm into abduction   Passive ROM to bilateral UEs into flexion, abduction, and ER to pt's tolerance. Left arm was tighter than Rt but loosened following PROM                         [facility] Goals - 01/04/16 1725    CC Long Term Goal  #1   Title Patient will show active shoulder flexion of at least 140 degrees bilat. for improved overhead reach   Time 4   Period Weeks   Status New   CC Long Term Goal  #2   Title Active shoulder abduction bilateral of at least 120 degrees for improved ADLs   Time 4   Period Weeks   Status New   CC Long Term Goal  #3   Title Patient will demonstrate ability to perform simulated work tasks indicating she may be able to return to her job   Time 4   Period Weeks   Status New            Plan - 01/09/16 0843    Clinical Impression Statement Pt reported feeling very good after last visit and has been continuing to do HEP. Is already noticing imporvements with her A/ROM with her ADLs and does not have increased pain after stretching.    Rehab Potential Excellent   PT Frequency 3x / week   PT Duration 2 weeks   PT Treatment/Interventions ADLs/Self Care Home Management;Electrical Stimulation;DME Instruction;Therapeutic exercise;Patient/family education;Manual techniques;Passive range of motion;Taping   PT Next Visit Plan Measure A/ROM for goal assess; continue P/AA/AROM for both shoulders and progress HEP prn.    PT Home Exercise Plan dowel exercises for both shoulder ROM   Consulted and Agree with Plan of Care Patient      Patient will benefit from skilled therapeutic intervention in order to improve the following deficits and impairments:  Decreased range of motion, Decreased strength, Impaired UE functional use  Visit Diagnosis: Stiffness of right shoulder, not elsewhere classified  Stiffness of left shoulder, not elsewhere classified  Aftercare following surgery for neoplasm     Problem List Patient Active Problem List   Diagnosis Date Noted  . Genetic testing 11/01/2015  . Family history of colon cancer   . Family history of pancreatic cancer   . Breast cancer of lower-inner quadrant of left female breast  ([place]) 09/13/2015  . Left breast mass 07/12/2015  . H/O total thyroidectomy 06/20/2015  . Encounter for contraceptive management 04/28/2015  . Enlarged thyroid 09/06/2014  . Skin irritation 08/16/2014  . Status post cesarean delivery 07/30/2014  . Left sciatic nerve pain 04/26/2014  . Previous cesarean delivery, antepartum 12/30/2013  . Supervision of other high-risk pregnancy 12/30/2013  . History of Hodgkin's disease 11/24/2012  . Hypothyroidism     Otelia Limes, PTA 01/09/2016, 8:48 AM  [place], [place], [address] Phone: [phone]   Fax:  [phone]  Name: Bonnie Hove  Brown MRN: [id number] Date of Birth: [birth year]

## 2016-01-11 ENCOUNTER — Ambulatory Visit: Payer: PRIVATE HEALTH INSURANCE

## 2016-01-11 DIAGNOSIS — M25611 Stiffness of right shoulder, not elsewhere classified: Secondary | ICD-10-CM

## 2016-01-11 DIAGNOSIS — M25612 Stiffness of left shoulder, not elsewhere classified: Secondary | ICD-10-CM

## 2016-01-11 DIAGNOSIS — Z483 Aftercare following surgery for neoplasm: Secondary | ICD-10-CM

## 2016-01-11 NOTE — Patient Instructions (Signed)
Over Head Pull: Narrow Grip and Wide Grip    Cancer Rehab [phone]   On back, knees bent, feet flat, band across thighs, elbows straight but relaxed. Pull hands apart (start). Keeping elbows straight, bring arms up and over head, hands toward floor. Keep pull steady on band. Hold momentarily. Return slowly, keeping pull steady, back to start. Then do with a wider grip. Repeat _10__ times. Band color __yellow____   Side Pull: Double Arm   On back, knees bent, feet flat. Arms perpendicular to body, shoulder level, elbows straight but relaxed. Pull arms out to sides, elbows straight. Resistance band comes across collarbones, hands toward floor. Hold momentarily. Slowly return to starting position. Repeat _10__ times. Band color yellow_____   Sword   On back, knees bent, feet flat, left hand on left hip, right hand above left. Pull right arm DIAGONALLY (hip to shoulder) across chest. Bring right arm along head toward floor. Hold momentarily. Slowly return to starting position. Repeat __10_ times. Do with left arm. Band color _yellow_____   Shoulder Rotation: Double Arm   On back, knees bent, feet flat, elbows tucked at sides, bent 90, hands palms up. Pull hands apart and down toward floor, keeping elbows near sides. Hold momentarily. Slowly return to starting position. Repeat _10__ times. Band color _yellow_____

## 2016-01-11 NOTE — Therapy (Signed)
[place], [place], [address] Phone: [phone]   Fax:  [phone]  Physical Therapy Treatment  Patient Details  Name: Bonnie Brown MRN: [id number] Date of Birth: 01-28-1981 Referring Provider: Dr. Fanny Skates  Encounter Date: 01/11/2016      PT End of Session - 01/11/16 1206    Visit Number 4   Number of Visits 13   Date for PT Re-Evaluation 02/02/16   PT Start Time 1105   PT Stop Time 1151   PT Time Calculation (min) 46 min   Activity Tolerance Patient tolerated treatment well   Behavior During Therapy [facility] for tasks assessed/performed      Past Medical History  Diagnosis Date  . Hypothyroidism   . History of blood transfusion 1995  . History of chemotherapy age 59  . Migraines   . History of hodgkin's lymphoma age 35  . Hypertension     under control with med., has been on med. since 35/2017  . Irregular heartbeat     states due to radiation treatments - no med., has not seen cardiology since 2013  . Atypical hyperplasia of left breast 08/2015  . Family history of colon cancer   . Family history of pancreatic cancer   . H/O colonoscopy   . H/O bone density study   . Cancer [facility])     left breast  . Breast disorder     +breast cancer    Past Surgical History  Procedure Laterality Date  . Lymph node biopsy  1992  . Cesarean section N/A 11/04/2012    Procedure: Primary cesarean section with delivery of baby boy at 2007. apgars 8/9.;  Surgeon: Osborne Oman, MD;  Location: [place] [facility];  Service: Obstetrics;  Laterality: N/A;  . Wisdom tooth extraction    . Cesarean section N/A 07/30/2014    Procedure: REPEAT CESAREAN SECTION;  Surgeon: Jonnie Kind, MD;  Location: [place] [facility];  Service: Obstetrics;  Laterality: N/A;  . Total thyroidectomy  05/17/2015  . Breast lumpectomy with radioactive seed localization Left 09/06/2015    Procedure: BREAST LUMPECTOMY WITH RADIOACTIVE SEED  LOCALIZATION;  Surgeon: Fanny Skates, MD;  Location: [place];  Service: General;  Laterality: Left;  . Breast biopsy Bilateral   . Mastectomy w/ sentinel node biopsy Bilateral 12/12/2015    Procedure: LEFT TOTAL MASTECTOMY WITH LEFT AXILLARY SENTINEL LYMPH NODE BIOPSY AND RIGHT PROPHYLACTIC MASTECTOMY;  Surgeon: Fanny Skates, MD;  Location: [place];  Service: General;  Laterality: Bilateral;  . Breast reconstruction with placement of tissue expander and flex hd (acellular hydrated dermis) Bilateral 12/12/2015    Procedure: BILATERAL BREAST RECONSTRUCTION WITH PLACEMENT OF TISSUE EXPANDERS AND FLEX HD (ACELLULAR HYDRATED DERMIS);  Surgeon: Loel Lofty Dillingham, DO;  Location: [place];  Service: Plastics;  Laterality: Bilateral;    There were no vitals filed for this visit.      Subjective Assessment - 01/11/16 1108    Subjective I felt great after last visit. Feel like I can do more with my Lt arm now. I have an appointment with my doctor after my session with you guys Friday and I hope they let me go back to work this month.    Pertinent History Early stage left breast cancer with bilateral mastectomies, left SLNB (4 nodes removed) on 12/12/15, all negative.  Had immediate reconstruction with expanders in place.  Has had two fills already.  No date for that surgery.  HTN controlled with meds.  Thyroid resected last November with precancer, and patient has a visible incision at anterior neck from that.   Patient Stated Goals Be able to use the left arm better.   Currently in Pain? No/denies            [facility] PT Assessment - 01/11/16 0001    AROM   Right Shoulder Flexion 135 Degrees   Right Shoulder ABduction 131 Degrees   Left Shoulder Flexion 127 Degrees   Left Shoulder ABduction 119 Degrees                     [facility] Adult PT Treatment/Exercise - 01/11/16 0001    Shoulder Exercises: Supine   Horizontal ABduction Strengthening;Both;10 reps;Theraband   Theraband  Level (Shoulder Horizontal ABduction) Level 1 (Yellow)   External Rotation Strengthening;Both;10 reps;Theraband   Theraband Level (Shoulder External Rotation) Level 1 (Yellow)   Flexion Strengthening;Both;10 reps;Theraband  Narrow and wide grip x10 each   Other Supine Exercises Bil D2 with yellow theraband x10 each with pt returning therapist demo.    Shoulder Exercises: Pulleys   Flexion 2 minutes   ABduction 2 minutes   Shoulder Exercises: Therapy Ball   Flexion 10 reps   ABduction 10 reps   Manual Therapy   Manual Therapy Passive ROM;Myofascial release   Myofascial Release bilateral UE pulling while moving arm into abduction   Passive ROM to bilateral UEs into flexion, abduction, and er to pt's tolerance.                 PT Education - 01/11/16 1131    Education provided Yes   Education Details Supine scapular series with yellow theraband   Person(s) Educated Patient   Methods Explanation;Demonstration;Handout   Comprehension Verbalized understanding;Returned demonstration;Need further instruction                [facility] Goals - 01/11/16 1215    CC Long Term Goal  #1   Title Patient will show active shoulder flexion of at least 140 degrees bilat. for improved overhead reach  Rt 135 and Lt 127 degrees 01/11/16   Status On-going   CC Long Term Goal  #2   Title Active shoulder abduction bilateral of at least 120 degrees for improved ADLs  Rt 131 and Lt 119 degrees 01/11/16   Status Partially Met   CC Long Term Goal  #3   Title Patient will demonstrate ability to perform simulated work tasks indicating she may be able to return to her job   Status On-going            Plan - 01/11/16 1207    Clinical Impression Statement Pt continues to report feeling better after therapy session and has been working on her HEP over weekend and can notice improvement with her A/ROM with her ADLs. She also made great improvements towards her goals today as well. Pt is  pleased with her progress thus far.    Rehab Potential Excellent   PT Frequency 3x / week   PT Duration 2 weeks   PT Treatment/Interventions ADLs/Self Care Home Management;Electrical Stimulation;DME Instruction;Therapeutic exercise;Patient/family education;Manual techniques;Passive range of motion;Taping   PT Next Visit Plan Continue P/AA/AROM for both shoulders and review and progress HEP prn.    PT Home Exercise Plan Cont dowel exercises for both shoulder ROM and added supine scapular sereies with yellow theraband but only a few times a week for now.   Consulted and Agree with Plan of Care Patient  Patient will benefit from skilled therapeutic intervention in order to improve the following deficits and impairments:  Decreased range of motion, Decreased strength, Impaired UE functional use  Visit Diagnosis: Stiffness of right shoulder, not elsewhere classified  Stiffness of left shoulder, not elsewhere classified  Aftercare following surgery for neoplasm     Problem List Patient Active Problem List   Diagnosis Date Noted  . Genetic testing 11/01/2015  . Family history of colon cancer   . Family history of pancreatic cancer   . Breast cancer of lower-inner quadrant of left female breast ([place]) 09/13/2015  . Left breast mass 07/12/2015  . H/O total thyroidectomy 06/20/2015  . Encounter for contraceptive management 04/28/2015  . Enlarged thyroid 09/06/2014  . Skin irritation 08/16/2014  . Status post cesarean delivery 07/30/2014  . Left sciatic nerve pain 04/26/2014  . Previous cesarean delivery, antepartum 12/30/2013  . Supervision of other high-risk pregnancy 12/30/2013  . History of Hodgkin's disease 11/24/2012  . Hypothyroidism     Otelia Limes, PTA 01/11/2016, 12:18 PM  [place], [place], [address] Phone: [phone]   Fax:  [phone]  Name: Bonnie Brown MRN:  [id number] Date of Birth: 22-Sep-1980

## 2016-01-13 ENCOUNTER — Encounter: Payer: PRIVATE HEALTH INSURANCE | Admitting: Physical Therapy

## 2016-01-16 ENCOUNTER — Ambulatory Visit: Payer: PRIVATE HEALTH INSURANCE

## 2016-01-16 DIAGNOSIS — M25611 Stiffness of right shoulder, not elsewhere classified: Secondary | ICD-10-CM | POA: Diagnosis not present

## 2016-01-16 DIAGNOSIS — Z483 Aftercare following surgery for neoplasm: Secondary | ICD-10-CM

## 2016-01-16 DIAGNOSIS — M25612 Stiffness of left shoulder, not elsewhere classified: Secondary | ICD-10-CM

## 2016-01-16 NOTE — Therapy (Signed)
[place], [place], [address] Phone: [phone]   Fax:  [phone]  Physical Therapy Treatment  Patient Details  Name: Bonnie Brown MRN: [id number] Date of Birth: 09-24-1980 Referring Provider: Dr. Fanny Skates  Encounter Date: 01/16/2016      PT End of Session - 01/16/16 1018    Visit Number 5   Number of Visits 13   Date for PT Re-Evaluation 02/02/16   PT Start Time 0935   PT Stop Time 1017   PT Time Calculation (min) 42 min   Activity Tolerance Patient tolerated treatment well   Behavior During Therapy [facility] for tasks assessed/performed      Past Medical History  Diagnosis Date  . Hypothyroidism   . History of blood transfusion 1995  . History of chemotherapy age 61  . Migraines   . History of hodgkin's lymphoma age 70  . Hypertension     under control with med., has been on med. since 07/2015  . Irregular heartbeat     states due to radiation treatments - no med., has not seen cardiology since 2013  . Atypical hyperplasia of left breast 08/2015  . Family history of colon cancer   . Family history of pancreatic cancer   . H/O colonoscopy   . H/O bone density study   . Cancer [facility])     left breast  . Breast disorder     +breast cancer    Past Surgical History  Procedure Laterality Date  . Lymph node biopsy  1992  . Cesarean section N/A 11/04/2012    Procedure: Primary cesarean section with delivery of baby boy at 2007. apgars 8/9.;  Surgeon: Osborne Oman, MD;  Location: [place] [facility];  Service: Obstetrics;  Laterality: N/A;  . Wisdom tooth extraction    . Cesarean section N/A 07/30/2014    Procedure: REPEAT CESAREAN SECTION;  Surgeon: Jonnie Kind, MD;  Location: [place] [facility];  Service: Obstetrics;  Laterality: N/A;  . Total thyroidectomy  05/17/2015  . Breast lumpectomy with radioactive seed localization Left 09/06/2015    Procedure: BREAST LUMPECTOMY WITH RADIOACTIVE SEED  LOCALIZATION;  Surgeon: Fanny Skates, MD;  Location: [place];  Service: General;  Laterality: Left;  . Breast biopsy Bilateral   . Mastectomy w/ sentinel node biopsy Bilateral 12/12/2015    Procedure: LEFT TOTAL MASTECTOMY WITH LEFT AXILLARY SENTINEL LYMPH NODE BIOPSY AND RIGHT PROPHYLACTIC MASTECTOMY;  Surgeon: Fanny Skates, MD;  Location: [place];  Service: General;  Laterality: Bilateral;  . Breast reconstruction with placement of tissue expander and flex hd (acellular hydrated dermis) Bilateral 12/12/2015    Procedure: BILATERAL BREAST RECONSTRUCTION WITH PLACEMENT OF TISSUE EXPANDERS AND FLEX HD (ACELLULAR HYDRATED DERMIS);  Surgeon: Loel Lofty Dillingham, DO;  Location: [place];  Service: Plastics;  Laterality: Bilateral;    There were no vitals filed for this visit.      Subjective Assessment - 01/16/16 0938    Subjective I got filled (in expander) with 30 cc's Friday so felt pretty tight over weekend but feeling better today. I was put on antibiotics that I finish today for a sinus infection last week, don't remember the name.     Pertinent History Early stage left breast cancer with bilateral mastectomies, left SLNB (4 nodes removed) on 12/12/15, all negative.  Had immediate reconstruction with expanders in place.  Has had two fills already.  No date for that surgery.  HTN controlled with meds.  Thyroid resected last  November with precancer, and patient has a visible incision at anterior neck from that.   Patient Stated Goals Be able to use the left arm better.   Currently in Pain? No/denies                         [facility] Adult PT Treatment/Exercise - 01/16/16 0001    Shoulder Exercises: Standing   Flexion Strengthening;Both;10 reps;Weights  with back against wall and core engaged   Shoulder Flexion Weight (lbs) 2   ABduction Strengthening;Both;10 reps;Weights  with back against wall and core engaged   Shoulder ABduction Weight (lbs) 2   Other Standing  Exercises Bil scaption to 90 degrees with 2 lbs x10 with back against wall and core engaged   Shoulder Exercises: Pulleys   Flexion 2 minutes   ABduction 2 minutes   Shoulder Exercises: Therapy Ball   Flexion 10 reps   ABduction 10 reps   Shoulder Exercises: Stretch   External Rotation Stretch 3 reps;10 seconds  In doorframe   Manual Therapy   Manual Therapy Passive ROM;Myofascial release   Myofascial Release bilateral UE pulling while moving arm into abduction   Passive ROM to bilateral UEs into flexion, abduction, and er to pt's tolerance.                         [facility] Goals - 01/11/16 1215    CC Long Term Goal  #1   Title Patient will show active shoulder flexion of at least 140 degrees bilat. for improved overhead reach  Rt 135 and Lt 127 degrees 01/11/16   Status On-going   CC Long Term Goal  #2   Title Active shoulder abduction bilateral of at least 120 degrees for improved ADLs  Rt 131 and Lt 119 degrees 01/11/16   Status Partially Met   CC Long Term Goal  #3   Title Patient will demonstrate ability to perform simulated work tasks indicating she may be able to return to her job   Status On-going            Plan - 01/16/16 1018    Clinical Impression Statement Pt continuing to tolerate stretching well. Began progression of gentle strengthening today with standing 3 way raises with 2 lbs which pt reported felt okay during and after. Pt still tight with Lt>Rt shoulders end ROM and will benefit from contuing therpay to progress toward goals of increasing her  A/ROM.   Rehab Potential Excellent   PT Frequency 3x / week   PT Duration 2 weeks   PT Treatment/Interventions ADLs/Self Care Home Management;Electrical Stimulation;DME Instruction;Therapeutic exercise;Patient/family education;Manual techniques;Passive range of motion;Taping   PT Next Visit Plan Measure ROM and assess goals next visit. Continue P/AA/AROM for both shoulders and review and  progress HEP prn.    Consulted and Agree with Plan of Care Patient      Patient will benefit from skilled therapeutic intervention in order to improve the following deficits and impairments:  Decreased range of motion, Decreased strength, Impaired UE functional use  Visit Diagnosis: Stiffness of right shoulder, not elsewhere classified  Stiffness of left shoulder, not elsewhere classified  Aftercare following surgery for neoplasm     Problem List Patient Active Problem List   Diagnosis Date Noted  . Genetic testing 11/01/2015  . Family history of colon cancer   . Family history of pancreatic cancer   . Breast cancer of lower-inner quadrant of  left female breast ([place]) 09/13/2015  . Left breast mass 07/12/2015  . H/O total thyroidectomy 06/20/2015  . Encounter for contraceptive management 04/28/2015  . Enlarged thyroid 09/06/2014  . Skin irritation 08/16/2014  . Status post cesarean delivery 07/30/2014  . Left sciatic nerve pain 04/26/2014  . Previous cesarean delivery, antepartum 12/30/2013  . Supervision of other high-risk pregnancy 12/30/2013  . History of Hodgkin's disease 11/24/2012  . Hypothyroidism     Otelia Limes, PTA 01/16/2016, 10:21 AM  [place], [place], [address] Phone: [phone]   Fax:  [phone]  Name: Bonnie Brown MRN: [id number] Date of Birth: 19-Sep-1980

## 2016-01-18 ENCOUNTER — Ambulatory Visit: Payer: PRIVATE HEALTH INSURANCE

## 2016-01-18 DIAGNOSIS — M25612 Stiffness of left shoulder, not elsewhere classified: Secondary | ICD-10-CM

## 2016-01-18 DIAGNOSIS — M25611 Stiffness of right shoulder, not elsewhere classified: Secondary | ICD-10-CM

## 2016-01-18 DIAGNOSIS — Z483 Aftercare following surgery for neoplasm: Secondary | ICD-10-CM

## 2016-01-18 NOTE — Therapy (Signed)
[place], [place], [address] Phone: [phone]   Fax:  [phone]  Physical Therapy Treatment  Patient Details  Name: Bonnie Brown MRN: [id number] Date of Birth: 1980/12/07 Referring Provider: Dr. Fanny Skates  Encounter Date: 01/18/2016      PT End of Session - 01/18/16 1020    Visit Number 6   Number of Visits 13   Date for PT Re-Evaluation 02/02/16   PT Start Time 0939   PT Stop Time 1020   PT Time Calculation (min) 41 min   Activity Tolerance Patient tolerated treatment well   Behavior During Therapy [facility] for tasks assessed/performed      Past Medical History  Diagnosis Date  . Hypothyroidism   . History of blood transfusion 1995  . History of chemotherapy age 35  . Migraines   . History of hodgkin's lymphoma age 35  . Hypertension     under control with med., has been on med. since 07/2015  . Irregular heartbeat     states due to radiation treatments - no med., has not seen cardiology since 2013  . Atypical hyperplasia of left breast 08/2015  . Family history of colon cancer   . Family history of pancreatic cancer   . H/O colonoscopy   . H/O bone density study   . Cancer [facility])     left breast  . Breast disorder     +breast cancer    Past Surgical History  Procedure Laterality Date  . Lymph node biopsy  1992  . Cesarean section N/A 11/04/2012    Procedure: Primary cesarean section with delivery of baby boy at 2007. apgars 8/9.;  Surgeon: Osborne Oman, MD;  Location: [place] [facility];  Service: Obstetrics;  Laterality: N/A;  . Wisdom tooth extraction    . Cesarean section N/A 07/30/2014    Procedure: REPEAT CESAREAN SECTION;  Surgeon: Jonnie Kind, MD;  Location: [place] [facility];  Service: Obstetrics;  Laterality: N/A;  . Total thyroidectomy  05/17/2015  . Breast lumpectomy with radioactive seed localization Left 09/06/2015    Procedure: BREAST LUMPECTOMY WITH RADIOACTIVE SEED  LOCALIZATION;  Surgeon: Fanny Skates, MD;  Location: [place];  Service: General;  Laterality: Left;  . Breast biopsy Bilateral   . Mastectomy w/ sentinel node biopsy Bilateral 12/12/2015    Procedure: LEFT TOTAL MASTECTOMY WITH LEFT AXILLARY SENTINEL LYMPH NODE BIOPSY AND RIGHT PROPHYLACTIC MASTECTOMY;  Surgeon: Fanny Skates, MD;  Location: [place];  Service: General;  Laterality: Bilateral;  . Breast reconstruction with placement of tissue expander and flex hd (acellular hydrated dermis) Bilateral 12/12/2015    Procedure: BILATERAL BREAST RECONSTRUCTION WITH PLACEMENT OF TISSUE EXPANDERS AND FLEX HD (ACELLULAR HYDRATED DERMIS);  Surgeon: Loel Lofty Dillingham, DO;  Location: [place];  Service: Plastics;  Laterality: Bilateral;    There were no vitals filed for this visit.      Subjective Assessment - 01/18/16 0942    Subjective I felt good after last visit, no pain.    Pertinent History Early stage left breast cancer with bilateral mastectomies, left SLNB (4 nodes removed) on 12/12/15, all negative.  Had immediate reconstruction with expanders in place.  Has had two fills already.  No date for that surgery.  HTN controlled with meds.  Thyroid resected last November with precancer, and patient has a visible incision at anterior neck from that.   Patient Stated Goals Be able to use the left arm better.   Currently in  Pain? No/denies            [facility] PT Assessment - 01/18/16 0001    AROM   Right Shoulder Flexion 138 Degrees   Right Shoulder ABduction 142 Degrees   Left Shoulder Flexion 125 Degrees   Left Shoulder ABduction 121 Degrees                     [facility] Adult PT Treatment/Exercise - 01/18/16 0001    Shoulder Exercises: Standing   Flexion Strengthening;Both;10 reps;Weights  With back against wall and core engaged   Shoulder Flexion Weight (lbs) 2   ABduction Strengthening;Both;10 reps;Weights  With back against wall and core engaged   Shoulder  ABduction Weight (lbs) 2   Row Strengthening;Both;10 reps;Weights   Row Weight (lbs) 4   Row Limitations Tactile cuing for correct scapular retraction ROM bil.    Other Standing Exercises Bil scaption to 90 degrees with 2 lbs x10 with back against wall and core engaged   Shoulder Exercises: Pulleys   Flexion 2 minutes   ABduction 2 minutes   Shoulder Exercises: Therapy Ball   Flexion 10 reps   ABduction 10 reps   Shoulder Exercises: ROM/Strengthening   "W" Arms Tried "W" but pt unable to get wrists on wall so did with UE's straight 2 sets of 5 times with backward sholder rolls between sets.   Shoulder Exercises: Stretch   Corner Stretch 5 reps;10 seconds  In doorway   External Rotation Stretch 3 reps;10 seconds  In doorframe, Lt UE   Manual Therapy   Manual Therapy Passive ROM;Myofascial release   Myofascial Release bilateral UE pulling while moving arm into abduction   Passive ROM to bilateral UEs into flexion, abduction, D2, and er to pt's tolerance.                         [facility] Goals - 01/18/16 1024    CC Long Term Goal  #1   Title Patient will show active shoulder flexion of at least 140 degrees bilat. for improved overhead reach   Baseline Rt 135 and Lt 127 degrees 01/11/16; Rt 138 and Lt 125 degrees 01/18/16   Status On-going   CC Long Term Goal  #2   Title Active shoulder abduction bilateral of at least 120 degrees for improved ADLs   Baseline Rt 131 and Lt 119 01/11/16; Rt 142 and Lt 121 degrees 01/18/16   Status On-going   CC Long Term Goal  #3   Title Patient will demonstrate ability to perform simulated work tasks indicating she may be able to return to her job   Status On-going            Plan - 01/18/16 1021    Clinical Impression Statement Pt did well with session today without any increased soreness after. Her A/ROM in abduction has improved, minimal to no change with flexion but pt had a fill in her expander last week and feels this  has limited her ROM slightly since then.    Rehab Potential Excellent   PT Frequency 3x / week   PT Duration 2 weeks   PT Treatment/Interventions ADLs/Self Care Home Management;Electrical Stimulation;DME Instruction;Therapeutic exercise;Patient/family education;Manual techniques;Passive range of motion;Taping   PT Next Visit Plan Continue P/AA/AROM for both shoulders and review and progress HEP prn.    Consulted and Agree with Plan of Care Patient      Patient will benefit from skilled therapeutic intervention  in order to improve the following deficits and impairments:  Decreased range of motion, Decreased strength, Impaired UE functional use  Visit Diagnosis: Stiffness of right shoulder, not elsewhere classified  Stiffness of left shoulder, not elsewhere classified  Aftercare following surgery for neoplasm     Problem List Patient Active Problem List   Diagnosis Date Noted  . Genetic testing 11/01/2015  . Family history of colon cancer   . Family history of pancreatic cancer   . Breast cancer of lower-inner quadrant of left female breast ([place]) 09/13/2015  . Left breast mass 07/12/2015  . H/O total thyroidectomy 06/20/2015  . Encounter for contraceptive management 04/28/2015  . Enlarged thyroid 09/06/2014  . Skin irritation 08/16/2014  . Status post cesarean delivery 07/30/2014  . Left sciatic nerve pain 04/26/2014  . Previous cesarean delivery, antepartum 12/30/2013  . Supervision of other high-risk pregnancy 12/30/2013  . History of Hodgkin's disease 11/24/2012  . Hypothyroidism     Otelia Limes, PTA 01/18/2016, 10:26 AM  [place], [place], [address] Phone: [phone]   Fax:  [phone]  Name: ZAKIA POPHAM MRN: [id number] Date of Birth: 03/22/1981

## 2016-01-19 ENCOUNTER — Ambulatory Visit: Payer: PRIVATE HEALTH INSURANCE

## 2016-01-19 DIAGNOSIS — M25611 Stiffness of right shoulder, not elsewhere classified: Secondary | ICD-10-CM

## 2016-01-19 DIAGNOSIS — M25612 Stiffness of left shoulder, not elsewhere classified: Secondary | ICD-10-CM

## 2016-01-19 DIAGNOSIS — Z483 Aftercare following surgery for neoplasm: Secondary | ICD-10-CM

## 2016-01-19 NOTE — Therapy (Signed)
[place], [place], [address] Phone: [phone]   Fax:  [phone]  Physical Therapy Treatment  Patient Details  Name: Bonnie Brown MRN: [id number] Date of Birth: 1980-10-11 Referring Provider: Dr. Fanny Skates  Encounter Date: 01/19/2016      PT End of Session - 01/19/16 1107    Visit Number 7   Number of Visits 13   Date for PT Re-Evaluation 02/02/16   PT Start Time 1020   PT Stop Time 1105   PT Time Calculation (min) 45 min   Activity Tolerance Patient tolerated treatment well   Behavior During Therapy [facility] for tasks assessed/performed      Past Medical History  Diagnosis Date  . Hypothyroidism   . History of blood transfusion 1995  . History of chemotherapy age 25  . Migraines   . History of hodgkin's lymphoma age 24  . Hypertension     under control with med., has been on med. since 07/2015  . Irregular heartbeat     states due to radiation treatments - no med., has not seen cardiology since 2013  . Atypical hyperplasia of left breast 08/2015  . Family history of colon cancer   . Family history of pancreatic cancer   . H/O colonoscopy   . H/O bone density study   . Cancer [facility])     left breast  . Breast disorder     +breast cancer    Past Surgical History  Procedure Laterality Date  . Lymph node biopsy  1992  . Cesarean section N/A 11/04/2012    Procedure: Primary cesarean section with delivery of baby boy at 2007. apgars 8/9.;  Surgeon: Osborne Oman, MD;  Location: [place] [facility];  Service: Obstetrics;  Laterality: N/A;  . Wisdom tooth extraction    . Cesarean section N/A 07/30/2014    Procedure: REPEAT CESAREAN SECTION;  Surgeon: Jonnie Kind, MD;  Location: [place] [facility];  Service: Obstetrics;  Laterality: N/A;  . Total thyroidectomy  05/17/2015  . Breast lumpectomy with radioactive seed localization Left 09/06/2015    Procedure: BREAST LUMPECTOMY WITH RADIOACTIVE SEED  LOCALIZATION;  Surgeon: Fanny Skates, MD;  Location: [place];  Service: General;  Laterality: Left;  . Breast biopsy Bilateral   . Mastectomy w/ sentinel node biopsy Bilateral 12/12/2015    Procedure: LEFT TOTAL MASTECTOMY WITH LEFT AXILLARY SENTINEL LYMPH NODE BIOPSY AND RIGHT PROPHYLACTIC MASTECTOMY;  Surgeon: Fanny Skates, MD;  Location: [place];  Service: General;  Laterality: Bilateral;  . Breast reconstruction with placement of tissue expander and flex hd (acellular hydrated dermis) Bilateral 12/12/2015    Procedure: BILATERAL BREAST RECONSTRUCTION WITH PLACEMENT OF TISSUE EXPANDERS AND FLEX HD (ACELLULAR HYDRATED DERMIS);  Surgeon: Loel Lofty Dillingham, DO;  Location: [place];  Service: Plastics;  Laterality: Bilateral;    There were no vitals filed for this visit.      Subjective Assessment - 01/19/16 1026    Subjective A little sore since yesterday, but went shopping for awhile after and then rocked my daughter to sleep last night and laid her in her crib which  haven't done in awhile and so I think I have extra soreness just from all that.   Pertinent History Early stage left breast cancer with bilateral mastectomies, left SLNB (4 nodes removed) on 12/12/15, all negative.  Had immediate reconstruction with expanders in place.  Has had two fills already.  No date for that surgery.  HTN controlled with meds.  Thyroid resected last November with precancer, and patient has a visible incision at anterior neck from that.   Patient Stated Goals Be able to use the left arm better.   Currently in Pain? No/denies                         [facility] Adult PT Treatment/Exercise - 01/19/16 0001    Shoulder Exercises: Supine   Horizontal ABduction AROM;Both;15 reps  On towel roll between scapulae   Other Supine Exercises Bil UE's in a "V" on towel roll between scapulae 15 times   Manual Therapy   Manual Therapy Passive ROM;Myofascial release   Myofascial Release bilateral  UE pulling while moving arm into abduction   Passive ROM to bilateral UEs into flexion, abduction, D2, and er to pt's tolerance.                         [facility] Goals - 01/18/16 1024    CC Long Term Goal  #1   Title Patient will show active shoulder flexion of at least 140 degrees bilat. for improved overhead reach   Baseline Rt 135 and Lt 127 degrees 01/11/16; Rt 138 and Lt 125 degrees 01/18/16   Status On-going   CC Long Term Goal  #2   Title Active shoulder abduction bilateral of at least 120 degrees for improved ADLs   Baseline Rt 131 and Lt 119 01/11/16; Rt 142 and Lt 121 degrees 01/18/16   Status On-going   CC Long Term Goal  #3   Title Patient will demonstrate ability to perform simulated work tasks indicating she may be able to return to her job   Status On-going            Plan - 01/19/16 1108    Clinical Impression Statement Pt was sore from yesterdays session so focused on gentler A/ROM stretching and manual therapy today. Pts P/ROM continues to improve with stretching and she reports noticing improvements with her ADLs.    Rehab Potential Excellent   PT Frequency 3x / week   PT Duration 2 weeks   PT Treatment/Interventions ADLs/Self Care Home Management;Electrical Stimulation;DME Instruction;Therapeutic exercise;Patient/family education;Manual techniques;Passive range of motion;Taping   PT Next Visit Plan Continue P/AA/AROM for both shoulders and review and progress HEP prn.    Consulted and Agree with Plan of Care Patient      Patient will benefit from skilled therapeutic intervention in order to improve the following deficits and impairments:  Decreased range of motion, Decreased strength, Impaired UE functional use  Visit Diagnosis: Stiffness of right shoulder, not elsewhere classified  Stiffness of left shoulder, not elsewhere classified  Aftercare following surgery for neoplasm     Problem List Patient Active Problem List    Diagnosis Date Noted  . Genetic testing 11/01/2015  . Family history of colon cancer   . Family history of pancreatic cancer   . Breast cancer of lower-inner quadrant of left female breast ([place]) 09/13/2015  . Left breast mass 07/12/2015  . H/O total thyroidectomy 06/20/2015  . Encounter for contraceptive management 04/28/2015  . Enlarged thyroid 09/06/2014  . Skin irritation 08/16/2014  . Status post cesarean delivery 07/30/2014  . Left sciatic nerve pain 04/26/2014  . Previous cesarean delivery, antepartum 12/30/2013  . Supervision of other high-risk pregnancy 12/30/2013  . History of Hodgkin's disease 11/24/2012  . Hypothyroidism     Otelia Limes, PTA 01/19/2016, 11:10 AM  [place], [place], [address] Phone: [phone]   Fax:  [phone]  Name: Bonnie Brown MRN: [id number] Date of Birth: [birth year]

## 2016-01-23 ENCOUNTER — Ambulatory Visit: Payer: PRIVATE HEALTH INSURANCE

## 2016-01-23 DIAGNOSIS — M25612 Stiffness of left shoulder, not elsewhere classified: Secondary | ICD-10-CM

## 2016-01-23 DIAGNOSIS — M25611 Stiffness of right shoulder, not elsewhere classified: Secondary | ICD-10-CM

## 2016-01-23 DIAGNOSIS — Z483 Aftercare following surgery for neoplasm: Secondary | ICD-10-CM

## 2016-01-23 NOTE — Therapy (Signed)
[place], [place], [address] Phone: [phone]   Fax:  [phone]  Physical Therapy Treatment  Patient Details  Name: Bonnie Brown MRN: [id number] Date of Birth: February 19, 1981 Referring Provider: Dr. Fanny Skates  Encounter Date: 01/23/2016      PT End of Session - 01/23/16 0934    Visit Number 8   Number of Visits 13   Date for PT Re-Evaluation 02/02/16   PT Start Time 0851   PT Stop Time 0935   PT Time Calculation (min) 44 min   Activity Tolerance Patient tolerated treatment well   Behavior During Therapy [facility] for tasks assessed/performed      Past Medical History  Diagnosis Date  . Hypothyroidism   . History of blood transfusion 1995  . History of chemotherapy age 35  . Migraines   . History of hodgkin's lymphoma age 35  . Hypertension     under control with med., has been on med. since 07/2015  . Irregular heartbeat     states due to radiation treatments - no med., has not seen cardiology since 2013  . Atypical hyperplasia of left breast 08/2015  . Family history of colon cancer   . Family history of pancreatic cancer   . H/O colonoscopy   . H/O bone density study   . Cancer [facility])     left breast  . Breast disorder     +breast cancer    Past Surgical History  Procedure Laterality Date  . Lymph node biopsy  1992  . Cesarean section N/A 11/04/2012    Procedure: Primary cesarean section with delivery of baby boy at 2007. apgars 8/9.;  Surgeon: Osborne Oman, MD;  Location: [place] [facility];  Service: Obstetrics;  Laterality: N/A;  . Wisdom tooth extraction    . Cesarean section N/A 07/30/2014    Procedure: REPEAT CESAREAN SECTION;  Surgeon: Jonnie Kind, MD;  Location: [place] [facility];  Service: Obstetrics;  Laterality: N/A;  . Total thyroidectomy  05/17/2015  . Breast lumpectomy with radioactive seed localization Left 09/06/2015    Procedure: BREAST LUMPECTOMY WITH RADIOACTIVE SEED  LOCALIZATION;  Surgeon: Fanny Skates, MD;  Location: [place];  Service: General;  Laterality: Left;  . Breast biopsy Bilateral   . Mastectomy w/ sentinel node biopsy Bilateral 12/12/2015    Procedure: LEFT TOTAL MASTECTOMY WITH LEFT AXILLARY SENTINEL LYMPH NODE BIOPSY AND RIGHT PROPHYLACTIC MASTECTOMY;  Surgeon: Fanny Skates, MD;  Location: [place];  Service: General;  Laterality: Bilateral;  . Breast reconstruction with placement of tissue expander and flex hd (acellular hydrated dermis) Bilateral 12/12/2015    Procedure: BILATERAL BREAST RECONSTRUCTION WITH PLACEMENT OF TISSUE EXPANDERS AND FLEX HD (ACELLULAR HYDRATED DERMIS);  Surgeon: Loel Lofty Dillingham, DO;  Location: [place];  Service: Plastics;  Laterality: Bilateral;    There were no vitals filed for this visit.      Subjective Assessment - 01/23/16 0852    Subjective Was a little sore after last visit with having the sessions back to back, but nothing unreasonable. I really want to go back to work so I want to push with therapy.    Pertinent History Early stage left breast cancer with bilateral mastectomies, left SLNB (4 nodes removed) on 12/12/15, all negative.  Had immediate reconstruction with expanders in place.  Has had two fills already.  No date for that surgery.  HTN controlled with meds.  Thyroid resected last November with precancer, and patient has a  visible incision at anterior neck from that.   Patient Stated Goals Be able to use the left arm better.   Currently in Pain? No/denies                         [facility] Adult PT Treatment/Exercise - 01/23/16 0001    Shoulder Exercises: Standing   Flexion Strengthening;Both;20 reps;Weights  2 sets of 10   Shoulder Flexion Weight (lbs) 2   ABduction Strengthening;Both;20 reps;Weights  2 sets of 10   Shoulder ABduction Weight (lbs) 2   Row Strengthening;Both;12 reps;Weights   Row Weight (lbs) 4   Row Limitations Tactile cuing for correct scapular  retraction ROM bil.    Other Standing Exercises Bil scaption to 90 degrees with 2 lbs, 2 x10 with back against wall and core engaged   Other Standing Exercises Leaning on chair for tricep extensions with 3 lbs, x 10; then progressed pts HEP by having her perform Scapular Series in standing with red theraband x 10 each except stopped narrow grip with flexion due to her Rt shoulder popping.    Shoulder Exercises: Pulleys   Flexion 2 minutes   ABduction 2 minutes   Shoulder Exercises: ROM/Strengthening   Wall Pushups 20 reps  2 sets of 10   Manual Therapy   Manual Therapy Passive ROM;Myofascial release   Myofascial Release bilateral UE pulling while moving arm into abduction   Passive ROM to bilateral UEs into flexion, abduction, D2, and er to pt's tolerance.                 PT Education - 01/23/16 [phone]    Education provided Yes   Education Details Standing scapular sereies with red theraband   Person(s) Educated Patient   Methods Explanation;Demonstration   Comprehension Verbalized understanding;Returned demonstration                [facility] Goals - 01/18/16 1024    CC Long Term Goal  #1   Title Patient will show active shoulder flexion of at least 140 degrees bilat. for improved overhead reach   Baseline Rt 135 and Lt 127 degrees 01/11/16; Rt 138 and Lt 125 degrees 01/18/16   Status On-going   CC Long Term Goal  #2   Title Active shoulder abduction bilateral of at least 120 degrees for improved ADLs   Baseline Rt 131 and Lt 119 01/11/16; Rt 142 and Lt 121 degrees 01/18/16   Status On-going   CC Long Term Goal  #3   Title Patient will demonstrate ability to perform simulated work tasks indicating she may be able to return to her job   Status On-going            Plan - 01/23/16 0935    Clinical Impression Statement Pt continues to progress well though is still limited with her A/ROM. Her scapular ROM seems to be most limiting factor at this time so began  scapular mobs with Lt UE P/ROM today and pt had increased P/ROM of Lt shoulder after this. Also progressed pts scapual strength to perform her supine scapular series in standing which she did well with except narrow grip flexion she will not do for now as this causes her Lt shoulder to pop each time.    Rehab Potential Excellent   PT Frequency 3x / week   PT Duration 4 weeks   PT Treatment/Interventions ADLs/Self Care Home Management;Electrical Stimulation;DME Instruction;Therapeutic exercise;Patient/family education;Manual techniques;Passive range of motion;Taping  PT Next Visit Plan Assess goals next visit. Incorporate scapular mobilizations (Lt >Rt ) with P/ROM as this seems to be limiting pt greatly. Continue P/AA/AROM for both shoulders and review and progress HEP prn.    Consulted and Agree with Plan of Care Patient      Patient will benefit from skilled therapeutic intervention in order to improve the following deficits and impairments:  Decreased range of motion, Decreased strength, Impaired UE functional use  Visit Diagnosis: Stiffness of right shoulder, not elsewhere classified  Stiffness of left shoulder, not elsewhere classified  Aftercare following surgery for neoplasm     Problem List Patient Active Problem List   Diagnosis Date Noted  . Genetic testing 11/01/2015  . Family history of colon cancer   . Family history of pancreatic cancer   . Breast cancer of lower-inner quadrant of left female breast ([place]) 09/13/2015  . Left breast mass 07/12/2015  . H/O total thyroidectomy 06/20/2015  . Encounter for contraceptive management 04/28/2015  . Enlarged thyroid 09/06/2014  . Skin irritation 08/16/2014  . Status post cesarean delivery 07/30/2014  . Left sciatic nerve pain 04/26/2014  . Previous cesarean delivery, antepartum 12/30/2013  . Supervision of other high-risk pregnancy 12/30/2013  . History of Hodgkin's disease 11/24/2012  . Hypothyroidism     Otelia Limes, PTA 01/23/2016, 9:38 AM  [place], [place], [address] Phone: [phone]   Fax:  [phone]  Name: LAURAANNE EYMARD MRN: [id number] Date of Birth: 08-30-1980

## 2016-01-25 ENCOUNTER — Encounter: Payer: Self-pay | Admitting: Physical Therapy

## 2016-01-25 ENCOUNTER — Ambulatory Visit: Payer: PRIVATE HEALTH INSURANCE | Admitting: Physical Therapy

## 2016-01-25 DIAGNOSIS — M25611 Stiffness of right shoulder, not elsewhere classified: Secondary | ICD-10-CM

## 2016-01-25 DIAGNOSIS — M25612 Stiffness of left shoulder, not elsewhere classified: Secondary | ICD-10-CM

## 2016-01-25 NOTE — Therapy (Signed)
[place], [place], [address] Phone: [phone]   Fax:  [phone]  Physical Therapy Treatment  Patient Details  Name: Bonnie Brown MRN: [id number] Date of Birth: Nov 25, 1980 Referring Provider: Dr. Fanny Skates  Encounter Date: 01/25/2016      PT End of Session - 01/25/16 1550    Visit Number 9   Number of Visits 13   Date for PT Re-Evaluation 02/02/16   PT Start Time 1436   PT Stop Time 1518   PT Time Calculation (min) 42 min   Activity Tolerance Patient tolerated treatment well   Behavior During Therapy [facility] for tasks assessed/performed      Past Medical History  Diagnosis Date  . Hypothyroidism   . History of blood transfusion 1995  . History of chemotherapy age 28  . Migraines   . History of hodgkin's lymphoma age 44  . Hypertension     under control with med., has been on med. since 07/2015  . Irregular heartbeat     states due to radiation treatments - no med., has not seen cardiology since 2013  . Atypical hyperplasia of left breast 08/2015  . Family history of colon cancer   . Family history of pancreatic cancer   . H/O colonoscopy   . H/O bone density study   . Cancer [facility])     left breast  . Breast disorder     +breast cancer    Past Surgical History  Procedure Laterality Date  . Lymph node biopsy  1992  . Cesarean section N/A 11/04/2012    Procedure: Primary cesarean section with delivery of baby boy at 2007. apgars 8/9.;  Surgeon: Osborne Oman, MD;  Location: [facility];  Service: Obstetrics;  Laterality: N/A;  . Wisdom tooth extraction    . Cesarean section N/A 07/30/2014    Procedure: REPEAT CESAREAN SECTION;  Surgeon: Jonnie Kind, MD;  Location: [facility];  Service: Obstetrics;  Laterality: N/A;  . Total thyroidectomy  05/17/2015  . Breast lumpectomy with radioactive seed localization Left 09/06/2015    Procedure: BREAST LUMPECTOMY WITH RADIOACTIVE SEED  LOCALIZATION;  Surgeon: Fanny Skates, MD;  Location: [place] [facility];  Service: General;  Laterality: Left;  . Breast biopsy Bilateral   . Mastectomy w/ sentinel node biopsy Bilateral 12/12/2015    Procedure: LEFT TOTAL MASTECTOMY WITH LEFT AXILLARY SENTINEL LYMPH NODE BIOPSY AND RIGHT PROPHYLACTIC MASTECTOMY;  Surgeon: Fanny Skates, MD;  Location: [place] [facility];  Service: General;  Laterality: Bilateral;  . Breast reconstruction with placement of tissue expander and flex hd (acellular hydrated dermis) Bilateral 12/12/2015    Procedure: BILATERAL BREAST RECONSTRUCTION WITH PLACEMENT OF TISSUE EXPANDERS AND FLEX HD (ACELLULAR HYDRATED DERMIS);  Surgeon: Loel Lofty Dillingham, DO;  Location: [place];  Service: Plastics;  Laterality: Bilateral;    There were no vitals filed for this visit.      Subjective Assessment - 01/25/16 1438    Subjective I was pretty sore after Monday. She worked me hard but thats what I wanted.    Pertinent History Early stage left breast cancer with bilateral mastectomies, left SLNB (4 nodes removed) on 12/12/15, all negative.  Had immediate reconstruction with expanders in place.  Has had two fills already.  No date for that surgery.  HTN controlled with meds.  Thyroid resected last November with precancer, and patient has a visible incision at anterior neck from that.   Patient Stated Goals Be able to use the  left arm better.   Currently in Pain? No/denies            [facility] PT Assessment - 01/25/16 0001    AROM   Right Shoulder Flexion 150 Degrees   Right Shoulder ABduction 115 Degrees   Left Shoulder Flexion 150 Degrees   Left Shoulder ABduction 110 Degrees                     [facility] Adult PT Treatment/Exercise - 01/25/16 0001    Shoulder Exercises: Standing   Flexion Strengthening;Both;20 reps;Weights   Shoulder Flexion Weight (lbs) 2   ABduction Strengthening;Both;20 reps;Weights   Shoulder ABduction Weight (lbs) 2   Row Strengthening;Both;12  reps;Weights   Row Weight (lbs) 4   Row Limitations Tactile cuing for correct scapular retraction ROM bil.    Other Standing Exercises Bil scaption to 90 degrees with 2 lbs, 2 x10 with back against wall and core engaged   Other Standing Exercises Leaning on chair for tricep extensions with 3 lbs, x 10; Scapular Series in standing with red theraband x 10 each   Shoulder Exercises: Pulleys   Flexion 2 minutes   ABduction 2 minutes   Shoulder Exercises: ROM/Strengthening   Wall Pushups 20 reps  2 sets of 10   Manual Therapy   Manual Therapy Passive ROM;Myofascial release;Joint mobilization   Joint Mobilization In left sidelying to R scapula into protraction and depression, in right sidelying to L scapula into protraction and depression   Myofascial Release bilateral UE pulling while moving arm into abduction   Passive ROM to bilateral UEs into flexion, abduction, and er to pt's tolerance.                         [facility] Goals - 01/25/16 1455    CC Long Term Goal  #1   Title Patient will show active shoulder flexion of at least 140 degrees bilat. for improved overhead reach   Baseline Rt 135 and Lt 127 degrees 01/11/16; Rt 138 and Lt 125 degrees 01/18/16, 01/25/16- L 150 deg, 150 on R   Time 4   Period Weeks   Status Achieved   CC Long Term Goal  #2   Title Active shoulder abduction bilateral of at least 120 degrees for improved ADLs   Baseline Rt 131 and Lt 119 01/11/16; Rt 142 and Lt 121 degrees 01/18/16, 01/25/16- 110 deg on L (pt can get more with some scaption), 115 R   Time 4   Period Weeks   Status On-going   CC Long Term Goal  #3   Title Patient will demonstrate ability to perform simulated work tasks indicating she may be able to return to her job   Time 4   Period Weeks   Status On-going            Plan - 01/25/16 1551    Clinical Impression Statement Pt tolerated PROM very well today. Her left shoulder is more limited actively than her right.  Scapular mobilization performed this session bilaterally to improve shoulder ROM. Pt was able to complete all exercises in scapular series while in standing with minimal cueing. There was no increase in popping this session.    Rehab Potential Excellent   PT Frequency 3x / week   PT Duration 4 weeks   PT Treatment/Interventions ADLs/Self Care Home Management;Electrical Stimulation;DME Instruction;Therapeutic exercise;Patient/family education;Manual techniques;Passive range of motion;Taping   PT Next Visit Plan Scapular mobs, Continue  P/AA/AROM for both shoulders and review and progress HEP prn.    PT Home Exercise Plan Cont dowel exercises for both shoulder ROM and added supine scapular sereies with yellow theraband but only a few times a week for now.   Consulted and Agree with Plan of Care Patient      Patient will benefit from skilled therapeutic intervention in order to improve the following deficits and impairments:  Decreased range of motion, Decreased strength, Impaired UE functional use  Visit Diagnosis: Stiffness of right shoulder, not elsewhere classified  Stiffness of left shoulder, not elsewhere classified     Problem List Patient Active Problem List   Diagnosis Date Noted  . Genetic testing 11/01/2015  . Family history of colon cancer   . Family history of pancreatic cancer   . Breast cancer of lower-inner quadrant of left female breast ([place]) 09/13/2015  . Left breast mass 07/12/2015  . H/O total thyroidectomy 06/20/2015  . Encounter for contraceptive management 04/28/2015  . Enlarged thyroid 09/06/2014  . Skin irritation 08/16/2014  . Status post cesarean delivery 07/30/2014  . Left sciatic nerve pain 04/26/2014  . Previous cesarean delivery, antepartum 12/30/2013  . Supervision of other high-risk pregnancy 12/30/2013  . History of Hodgkin's disease 11/24/2012  . Hypothyroidism     Alexia Freestone 01/25/2016, 3:55 PM  [place], [place], [address] Phone: [phone]   Fax:  [phone]  Name: LINNAE ANGIOLILLO MRN: [id number] Date of Birth: [birth year]    Allyson Sabal, PT 01/25/2016 3:55 PM

## 2016-01-27 ENCOUNTER — Ambulatory Visit: Payer: PRIVATE HEALTH INSURANCE | Admitting: Physical Therapy

## 2016-01-27 ENCOUNTER — Encounter: Payer: Self-pay | Admitting: Physical Therapy

## 2016-01-27 DIAGNOSIS — M25611 Stiffness of right shoulder, not elsewhere classified: Secondary | ICD-10-CM | POA: Diagnosis not present

## 2016-01-27 DIAGNOSIS — M25612 Stiffness of left shoulder, not elsewhere classified: Secondary | ICD-10-CM

## 2016-01-27 NOTE — Therapy (Signed)
[place], [place], [address] Phone: [phone]   Fax:  [phone]  Physical Therapy Treatment  Patient Details  Name: Bonnie Brown MRN: [id number] Date of Birth: 1980/10/08 Referring Provider: Dr. Fanny Skates  Encounter Date: 01/27/2016      PT End of Session - 01/27/16 1125    Visit Number 10   Number of Visits 13   Date for PT Re-Evaluation 02/02/16   PT Start Time 0802   PT Stop Time 0849   PT Time Calculation (min) 47 min   Activity Tolerance Patient tolerated treatment well   Behavior During Therapy [facility] for tasks assessed/performed      Past Medical History  Diagnosis Date  . Hypothyroidism   . History of blood transfusion 1995  . History of chemotherapy age 38  . Migraines   . History of hodgkin's lymphoma age 67  . Hypertension     under control with med., has been on med. since 07/2015  . Irregular heartbeat     states due to radiation treatments - no med., has not seen cardiology since 2013  . Atypical hyperplasia of left breast 08/2015  . Family history of colon cancer   . Family history of pancreatic cancer   . H/O colonoscopy   . H/O bone density study   . Cancer [facility])     left breast  . Breast disorder     +breast cancer    Past Surgical History  Procedure Laterality Date  . Lymph node biopsy  1992  . Cesarean section N/A 11/04/2012    Procedure: Primary cesarean section with delivery of baby boy at 2007. apgars 8/9.;  Surgeon: Osborne Oman, MD;  Location: [facility];  Service: Obstetrics;  Laterality: N/A;  . Wisdom tooth extraction    . Cesarean section N/A 07/30/2014    Procedure: REPEAT CESAREAN SECTION;  Surgeon: Jonnie Kind, MD;  Location: [facility];  Service: Obstetrics;  Laterality: N/A;  . Total thyroidectomy  05/17/2015  . Breast lumpectomy with radioactive seed localization Left 09/06/2015    Procedure: BREAST LUMPECTOMY WITH RADIOACTIVE SEED  LOCALIZATION;  Surgeon: Fanny Skates, MD;  Location: [place];  Service: General;  Laterality: Left;  . Breast biopsy Bilateral   . Mastectomy w/ sentinel node biopsy Bilateral 12/12/2015    Procedure: LEFT TOTAL MASTECTOMY WITH LEFT AXILLARY SENTINEL LYMPH NODE BIOPSY AND RIGHT PROPHYLACTIC MASTECTOMY;  Surgeon: Fanny Skates, MD;  Location: [place];  Service: General;  Laterality: Bilateral;  . Breast reconstruction with placement of tissue expander and flex hd (acellular hydrated dermis) Bilateral 12/12/2015    Procedure: BILATERAL BREAST RECONSTRUCTION WITH PLACEMENT OF TISSUE EXPANDERS AND FLEX HD (ACELLULAR HYDRATED DERMIS);  Surgeon: Loel Lofty Dillingham, DO;  Location: [facility];  Service: Plastics;  Laterality: Bilateral;    There were no vitals filed for this visit.      Subjective Assessment - 01/27/16 0804    Subjective Pt reports she has pain in her upper back and beside her spine.   Pertinent History Early stage left breast cancer with bilateral mastectomies, left SLNB (4 nodes removed) on 12/12/15, all negative.  Had immediate reconstruction with expanders in place.  Has had two fills already.  No date for that surgery.  HTN controlled with meds.  Thyroid resected last November with precancer, and patient has a visible incision at anterior neck from that.   Patient Stated Goals Be able to use the left arm better.  [facility] Adult PT Treatment/Exercise - 01/27/16 0001    Shoulder Exercises: Standing   Flexion Strengthening;Both;20 reps;Weights   Shoulder Flexion Weight (lbs) 2   ABduction Strengthening;Both;20 reps;Weights   Shoulder ABduction Weight (lbs) 2   Row Strengthening;Both;12 reps;Weights   Row Weight (lbs) 4   Row Limitations Tactile cuing for correct scapular retraction ROM bil.    Other Standing Exercises Bil scaption to 90 degrees with 2 lbs, 2 x10 with back against wall and core engaged   Other Standing Exercises  Leaning on chair for tricep extensions with 3 lbs, x 10; Scapular Series in standing with red theraband x 10 each   Shoulder Exercises: Pulleys   Flexion 2 minutes   ABduction 2 minutes   Shoulder Exercises: ROM/Strengthening   Wall Pushups 20 reps  2 sets of 10   Manual Therapy   Manual Therapy Passive ROM;Myofascial release;Joint mobilization   Joint Mobilization In left sidelying to R scapula into protraction and depression, in right sidelying to L scapula into protraction and depression   Myofascial Release bilateral UE pulling while moving arm into abduction   Passive ROM to bilateral UEs into flexion, abduction, and er to pt's tolerance.                         [facility] Goals - 01/25/16 1455    CC Long Term Goal  #1   Title Patient will show active shoulder flexion of at least 140 degrees bilat. for improved overhead reach   Baseline Rt 135 and Lt 127 degrees 01/11/16; Rt 138 and Lt 125 degrees 01/18/16, 01/25/16- L 150 deg, 150 on R   Time 4   Period Weeks   Status Achieved   CC Long Term Goal  #2   Title Active shoulder abduction bilateral of at least 120 degrees for improved ADLs   Baseline Rt 131 and Lt 119 01/11/16; Rt 142 and Lt 121 degrees 01/18/16, 01/25/16- 110 deg on L (pt can get more with some scaption), 115 R   Time 4   Period Weeks   Status On-going   CC Long Term Goal  #3   Title Patient will demonstrate ability to perform simulated work tasks indicating she may be able to return to her job   Time 4   Period Weeks   Status On-going            Plan - 01/27/16 1125    Clinical Impression Statement Pt is progressing towards goals in therapy. She demonstrated increased independence with her exercises and required less cueing today. Pt feels relief with scapular mobility. She wants to return to work soon and next Wednesday may be possible discharge date. There was no shoulder popping noted this visit.    Rehab Potential Excellent   PT  Frequency 3x / week   PT Duration 4 weeks   PT Treatment/Interventions ADLs/Self Care Home Management;Electrical Stimulation;DME Instruction;Therapeutic exercise;Patient/family education;Manual techniques;Passive range of motion;Taping   PT Next Visit Plan Scapular mobs, Continue P/AA/AROM for both shoulders and review and progress HEP prn. Supine scapular series possibly increase resistance   PT Home Exercise Plan Cont dowel exercises for both shoulder ROM and added supine scapular sereies with yellow theraband but only a few times a week for now.   Consulted and Agree with Plan of Care Patient      Patient will benefit from skilled therapeutic intervention in order to improve the following deficits and impairments:  Decreased range  of motion, Decreased strength, Impaired UE functional use  Visit Diagnosis: Stiffness of right shoulder, not elsewhere classified  Stiffness of left shoulder, not elsewhere classified     Problem List Patient Active Problem List   Diagnosis Date Noted  . Genetic testing 11/01/2015  . Family history of colon cancer   . Family history of pancreatic cancer   . Breast cancer of lower-inner quadrant of left female breast ([place]) 09/13/2015  . Left breast mass 07/12/2015  . H/O total thyroidectomy 06/20/2015  . Encounter for contraceptive management 04/28/2015  . Enlarged thyroid 09/06/2014  . Skin irritation 08/16/2014  . Status post cesarean delivery 07/30/2014  . Left sciatic nerve pain 04/26/2014  . Previous cesarean delivery, antepartum 12/30/2013  . Supervision of other high-risk pregnancy 12/30/2013  . History of Hodgkin's disease 11/24/2012  . Hypothyroidism     Bonnie Brown 01/27/2016, 11:27 AM  [place], [place], [address] Phone: [phone]   Fax:  [phone]  Name: RENEASHA SHONTZ MRN: [id number] Date of Birth: Sep 05, 1980    Allyson Sabal,  PT 01/27/2016 11:27 AM

## 2016-01-30 ENCOUNTER — Encounter: Payer: Self-pay | Admitting: Physical Therapy

## 2016-01-30 ENCOUNTER — Ambulatory Visit: Payer: PRIVATE HEALTH INSURANCE | Admitting: Physical Therapy

## 2016-01-30 DIAGNOSIS — M25612 Stiffness of left shoulder, not elsewhere classified: Secondary | ICD-10-CM

## 2016-01-30 DIAGNOSIS — M25611 Stiffness of right shoulder, not elsewhere classified: Secondary | ICD-10-CM | POA: Diagnosis not present

## 2016-01-30 NOTE — Therapy (Signed)
[place], [place], [address] Phone: [phone]   Fax:  [phone]  Physical Therapy Treatment  Patient Details  Name: Bonnie Brown MRN: [id number] Date of Birth: 1981/01/13 Referring Provider: Dr. Fanny Skates  Encounter Date: 01/30/2016      PT End of Session - 01/30/16 1205    Visit Number 11   Number of Visits 13   Date for PT Re-Evaluation 02/02/16   PT Start Time 1025   PT Stop Time 1106   PT Time Calculation (min) 41 min   Activity Tolerance Patient tolerated treatment well   Behavior During Therapy [facility] for tasks assessed/performed      Past Medical History:  Diagnosis Date  . Atypical hyperplasia of left breast 08/2015  . Breast disorder    +breast cancer  . Cancer [facility])    left breast  . Family history of colon cancer   . Family history of pancreatic cancer   . H/O bone density study   . H/O colonoscopy   . History of blood transfusion 1995  . History of chemotherapy age 27  . History of hodgkin's lymphoma age 50  . Hypertension    under control with med., has been on med. since 07/2015  . Hypothyroidism   . Irregular heartbeat    states due to radiation treatments - no med., has not seen cardiology since 2013  . Migraines     Past Surgical History:  Procedure Laterality Date  . BREAST BIOPSY Bilateral   . BREAST LUMPECTOMY WITH RADIOACTIVE SEED LOCALIZATION Left 09/06/2015   Procedure: BREAST LUMPECTOMY WITH RADIOACTIVE SEED LOCALIZATION;  Surgeon: Fanny Skates, MD;  Location: [place];  Service: General;  Laterality: Left;  . BREAST RECONSTRUCTION WITH PLACEMENT OF TISSUE EXPANDER AND FLEX HD (ACELLULAR HYDRATED DERMIS) Bilateral 12/12/2015   Procedure: BILATERAL BREAST RECONSTRUCTION WITH PLACEMENT OF TISSUE EXPANDERS AND FLEX HD (ACELLULAR HYDRATED DERMIS);  Surgeon: Loel Lofty Dillingham, DO;  Location: [place];  Service: Plastics;  Laterality:  Bilateral;  . CESAREAN SECTION N/A 11/04/2012   Procedure: Primary cesarean section with delivery of baby boy at 2007. apgars 8/9.;  Surgeon: Osborne Oman, MD;  Location: [facility];  Service: Obstetrics;  Laterality: N/A;  . CESAREAN SECTION N/A 07/30/2014   Procedure: REPEAT CESAREAN SECTION;  Surgeon: Jonnie Kind, MD;  Location: [facility];  Service: Obstetrics;  Laterality: N/A;  . LYMPH NODE BIOPSY  1992  . MASTECTOMY W/ SENTINEL NODE BIOPSY Bilateral 12/12/2015   Procedure: LEFT TOTAL MASTECTOMY WITH LEFT AXILLARY SENTINEL LYMPH NODE BIOPSY AND RIGHT PROPHYLACTIC MASTECTOMY;  Surgeon: Fanny Skates, MD;  Location: [facility];  Service: General;  Laterality: Bilateral;  . TOTAL THYROIDECTOMY  05/17/2015  . WISDOM TOOTH EXTRACTION      There were no vitals filed for this visit.      Subjective Assessment - 01/30/16 1028    Subjective My soreness is actually better.    Pertinent History Early stage left breast cancer with bilateral mastectomies, left SLNB (4 nodes removed) on 12/12/15, all negative.  Had immediate reconstruction with expanders in place.  Has had two fills already.  No date for that surgery.  HTN controlled with meds.  Thyroid resected last November with precancer, and patient has a visible incision at anterior neck from that.   Patient Stated Goals Be able to use the left arm better.   Currently in Pain? No/denies  [facility] Adult PT Treatment/Exercise - 01/30/16 0001      Shoulder Exercises: Standing   Flexion Strengthening;Both;20 reps;Weights   Shoulder Flexion Weight (lbs) 2   ABduction Strengthening;Both;20 reps;Weights   Shoulder ABduction Weight (lbs) 2   Row Strengthening;Both;12 reps;Weights   Row Weight (lbs) 4   Row Limitations Tactile cuing for correct scapular retraction ROM bil.    Other Standing Exercises Bil scaption to 90 degrees with 2 lbs, 2 x10 with back against wall and core engaged   Other Standing Exercises Leaning  on chair for tricep extensions with 4  lbs, x 10; Scapular Series in standing with red theraband x 10 each     Shoulder Exercises: Pulleys   Flexion 2 minutes   ABduction 2 minutes     Shoulder Exercises: ROM/Strengthening   Wall Pushups 20 reps  2 sets of 10     Manual Therapy   Manual Therapy Passive ROM;Myofascial release;Joint mobilization   Joint Mobilization In left sidelying to R scapula into protraction and depression, in right sidelying to L scapula into protraction and depression   Myofascial Release bilateral UE pulling while moving arm into abduction   Passive ROM to bilateral UEs into flexion, abduction, and er to pt's tolerance.                         [facility] Goals - 01/25/16 1455      CC Long Term Goal  #1   Title Patient will show active shoulder flexion of at least 140 degrees bilat. for improved overhead reach   Baseline Rt 135 and Lt 127 degrees 01/11/16; Rt 138 and Lt 125 degrees 01/18/16, 01/25/16- L 150 deg, 150 on R   Time 4   Period Weeks   Status Achieved     CC Long Term Goal  #2   Title Active shoulder abduction bilateral of at least 120 degrees for improved ADLs   Baseline Rt 131 and Lt 119 01/11/16; Rt 142 and Lt 121 degrees 01/18/16, 01/25/16- 110 deg on L (pt can get more with some scaption), 115 R   Time 4   Period Weeks   Status On-going     CC Long Term Goal  #3   Title Patient will demonstrate ability to perform simulated work tasks indicating she may be able to return to her job   Time 4   Period Weeks   Status On-going            Plan - 01/30/16 1205    Clinical Impression Statement Pt demonstrated improved bilateral shoulder ROM during PROM. She was able to attain full range during PROM in all directions in bilateral shoulders. Pt is hoping to return to work this week. Pt may be ready for discharge at next visit if she has met all of her goals.    Rehab Potential Excellent   PT Frequency 3x / week   PT Duration  4 weeks   PT Treatment/Interventions ADLs/Self Care Home Management;Electrical Stimulation;DME Instruction;Therapeutic exercise;Patient/family education;Manual techniques;Passive range of motion;Taping   PT Next Visit Plan reassess goals, possible d/c?   PT Home Exercise Plan Cont dowel exercises for both shoulder ROM and added supine scapular sereies with red theraband    Consulted and Agree with Plan of Care Patient      Patient will benefit from skilled therapeutic intervention in order to improve the following deficits and impairments:  Decreased range of motion, Decreased strength, Impaired UE functional  use  Visit Diagnosis: Stiffness of right shoulder, not elsewhere classified  Stiffness of left shoulder, not elsewhere classified     Problem List Patient Active Problem List   Diagnosis Date Noted  . Genetic testing 11/01/2015  . Family history of colon cancer   . Family history of pancreatic cancer   . Breast cancer of lower-inner quadrant of left female breast ([place]) 09/13/2015  . Left breast mass 07/12/2015  . H/O total thyroidectomy 06/20/2015  . Encounter for contraceptive management 04/28/2015  . Enlarged thyroid 09/06/2014  . Skin irritation 08/16/2014  . Status post cesarean delivery 07/30/2014  . Left sciatic nerve pain 04/26/2014  . Previous cesarean delivery, antepartum 12/30/2013  . Supervision of other high-risk pregnancy 12/30/2013  . History of Hodgkin's disease 11/24/2012  . Hypothyroidism     Alexia Freestone 01/30/2016, 12:10 PM  [place], [place], [address] Phone: [phone]   Fax:  [phone]  Name: Bonnie Brown MRN: [id number] Date of Birth: 1980/11/29   Allyson Sabal, PT 01/30/16 12:10 PM

## 2016-02-01 ENCOUNTER — Encounter: Payer: Self-pay | Admitting: Physical Therapy

## 2016-02-01 ENCOUNTER — Ambulatory Visit: Payer: PRIVATE HEALTH INSURANCE | Admitting: Physical Therapy

## 2016-02-01 DIAGNOSIS — M25611 Stiffness of right shoulder, not elsewhere classified: Secondary | ICD-10-CM

## 2016-02-01 DIAGNOSIS — M25612 Stiffness of left shoulder, not elsewhere classified: Secondary | ICD-10-CM

## 2016-02-01 NOTE — Patient Instructions (Signed)
Strengthening: Resisted Internal Rotation   Hold tubing in left hand, elbow at side and forearm out. Rotate forearm in across body. Repeat __10__ times per set. Do __1__ sets per session. Do _2___ sessions per day. Repeat on opposite side.  http://orth.exer.us/830   Copyright  VHI. All rights reserved.  Strengthening: Resisted External Rotation   Hold tubing in right hand, elbow at side and forearm across body. Rotate forearm out. Repeat on other side.  Repeat _10___ times per set. Do _1___ sets per session. Do _2___ sessions per day.  http://orth.exer.us/828   Copyright  VHI. All rights reserved.  Strengthening: Resisted Flexion   Hold tubing with left arm at side. Pull forward and up. Move shoulder through pain-free range of motion. Repeat on opposite side. Repeat _10___ times per set. Do ___1_ sets per session. Do _2___ sessions per day.  http://orth.exer.us/824   Copyright  VHI. All rights reserved.  Strengthening: Resisted Extension   Hold tubing in right hand, arm forward. Pull arm back, elbow straight. Repeat on opposite side. Repeat _10___ times per set. Do __1__ sets per session. Do __2__ sessions per day.  http://orth.exer.us/832   Copyright  VHI. All rights reserved.

## 2016-02-01 NOTE — Therapy (Signed)
[place], [place], [address] Phone: [phone]   Fax:  [phone]  Physical Therapy Treatment  Patient Details  Name: Bonnie Brown MRN: [id number] Date of Birth: 1980-12-30 Referring Provider: Dr. Fanny Skates  Encounter Date: 02/01/2016      PT End of Session - 02/01/16 1350    Visit Number 12   Number of Visits 17   Date for PT Re-Evaluation 02/15/16   PT Start Time 5449   PT Stop Time 1350   PT Time Calculation (min) 45 min   Activity Tolerance Patient tolerated treatment well   Behavior During Therapy [facility] for tasks assessed/performed      Past Medical History:  Diagnosis Date  . Atypical hyperplasia of left breast 08/2015  . Breast disorder    +breast cancer  . Cancer [facility])    left breast  . Family history of colon cancer   . Family history of pancreatic cancer   . H/O bone density study   . H/O colonoscopy   . History of blood transfusion 1995  . History of chemotherapy age 55  . History of hodgkin's lymphoma age 37  . Hypertension    under control with med., has been on med. since 07/2015  . Hypothyroidism   . Irregular heartbeat    states due to radiation treatments - no med., has not seen cardiology since 2013  . Migraines     Past Surgical History:  Procedure Laterality Date  . BREAST BIOPSY Bilateral   . BREAST LUMPECTOMY WITH RADIOACTIVE SEED LOCALIZATION Left 09/06/2015   Procedure: BREAST LUMPECTOMY WITH RADIOACTIVE SEED LOCALIZATION;  Surgeon: Fanny Skates, MD;  Location: [place];  Service: General;  Laterality: Left;  . BREAST RECONSTRUCTION WITH PLACEMENT OF TISSUE EXPANDER AND FLEX HD (ACELLULAR HYDRATED DERMIS) Bilateral 12/12/2015   Procedure: BILATERAL BREAST RECONSTRUCTION WITH PLACEMENT OF TISSUE EXPANDERS AND FLEX HD (ACELLULAR HYDRATED DERMIS);  Surgeon: Loel Lofty Dillingham, DO;  Location: [place];  Service: Plastics;  Laterality:  Bilateral;  . CESAREAN SECTION N/A 11/04/2012   Procedure: Primary cesarean section with delivery of baby boy at 2007. apgars 8/9.;  Surgeon: Osborne Oman, MD;  Location: [place] [facility];  Service: Obstetrics;  Laterality: N/A;  . CESAREAN SECTION N/A 07/30/2014   Procedure: REPEAT CESAREAN SECTION;  Surgeon: Jonnie Kind, MD;  Location: [place] [facility];  Service: Obstetrics;  Laterality: N/A;  . LYMPH NODE BIOPSY  1992  . MASTECTOMY W/ SENTINEL NODE BIOPSY Bilateral 12/12/2015   Procedure: LEFT TOTAL MASTECTOMY WITH LEFT AXILLARY SENTINEL LYMPH NODE BIOPSY AND RIGHT PROPHYLACTIC MASTECTOMY;  Surgeon: Fanny Skates, MD;  Location: [place];  Service: General;  Laterality: Bilateral;  . TOTAL THYROIDECTOMY  05/17/2015  . WISDOM TOOTH EXTRACTION      There were no vitals filed for this visit.      Subjective Assessment - 02/01/16 1307    Subjective I went back to work today. I am a little sore today. Yesterday I was pretty pretty sore. I got filled on Monday and that made me sore.    Pertinent History Early stage left breast cancer with bilateral mastectomies, left SLNB (4 nodes removed) on 12/12/15, all negative.  Had immediate reconstruction with expanders in place.  Has had two fills already.  No date for that surgery.  HTN controlled with meds.  Thyroid resected last November with precancer, and patient has a visible incision at anterior neck from that.   Patient Stated Goals Be  able to use the left arm better.   Currently in Pain? Yes   Pain Score 4    Pain Location Breast   Pain Orientation Right;Left   Pain Descriptors / Indicators Tightness;Sore   Pain Type Acute pain   Pain Onset In the past 7 days                         [facility] Adult PT Treatment/Exercise - 02/01/16 0001      Shoulder Exercises: Standing   Flexion Strengthening;Both;20 reps;Weights   Shoulder Flexion Weight (lbs) 2   ABduction Strengthening;Both;20 reps;Weights   Shoulder ABduction Weight (lbs) 2   Row  Strengthening;Both;12 reps;Weights   Row Weight (lbs) 4   Other Standing Exercises Bil scaption to 90 degrees with 2 lbs, 2 x10 with back against wall and core engaged   Other Standing Exercises Leaning on chair for tricep extensions with 4  lbs, x 10; rockwood Series in standing with red theraband x 10 each     Shoulder Exercises: Pulleys   Flexion 2 minutes   ABduction 2 minutes     Manual Therapy   Manual Therapy Passive ROM;Myofascial release;Joint mobilization   Joint Mobilization In left sidelying to R scapula into protraction and depression, in right sidelying to L scapula into protraction and depression   Myofascial Release bilateral UE pulling while moving arm into abduction   Passive ROM to bilateral UEs into flexion, abduction, and er to pt's tolerance.                         [facility] Goals - 02/01/16 1346      CC Long Term Goal  #1   Title Patient will show active shoulder flexion of at least 140 degrees bilat. for improved overhead reach   Baseline Rt 135 and Lt 127 degrees 01/11/16; Rt 138 and Lt 125 degrees 01/18/16, 01/25/16- L 150 deg, 150 on R   Status Achieved     CC Long Term Goal  #2   Title Active shoulder abduction bilateral of at least 120 degrees for improved ADLs   Baseline Rt 131 and Lt 119 01/11/16; Rt 142 and Lt 121 degrees 01/18/16, 01/25/16- 110 deg on L (pt can get more with some scaption), 115 R, 02/01/16- 148 on L, 172 on R   Time 4   Period Weeks   Status Achieved     CC Long Term Goal  #3   Title Patient will demonstrate ability to perform simulated work tasks indicating she may be able to return to her job   Time 4   Period Weeks   Status Achieved     CC Long Term Goal  #4   Title Pt will demonstrate 165 degrees of left shoulder abduction to allow pt to reach items out to sides   Baseline 148 degrees   Time 4   Period Weeks   Status New            Plan - 02/01/16 1351    Clinical Impression Statement Pt has made  excellent progress towards goals in therapy. She is becoming more independent with her exercises and her ROM has improved greatly. She met all of her goals so a new ROM goal for left shoulder abduction was added today. Pt's current POC to be extended at 2x/wk for another two weeks to allow pt to acheive full ROM. Pt started back to work today.  Rehab Potential Excellent   PT Frequency 2x / week   PT Duration 2 weeks   PT Treatment/Interventions ADLs/Self Care Home Management;Electrical Stimulation;DME Instruction;Therapeutic exercise;Patient/family education;Manual techniques;Passive range of motion;Taping   PT Next Visit Plan reassess goals, assess for indep with HEP and new rockwood exercises   PT Home Exercise Plan Cont dowel exercises for both shoulder ROM and added supine scapular sereies with red theraband    Consulted and Agree with Plan of Care Patient      Patient will benefit from skilled therapeutic intervention in order to improve the following deficits and impairments:  Decreased range of motion, Decreased strength, Impaired UE functional use  Visit Diagnosis: Stiffness of right shoulder, not elsewhere classified - Plan: PT plan of care cert/re-cert  Stiffness of left shoulder, not elsewhere classified - Plan: PT plan of care cert/re-cert     Problem List Patient Active Problem List   Diagnosis Date Noted  . Genetic testing 11/01/2015  . Family history of colon cancer   . Family history of pancreatic cancer   . Breast cancer of lower-inner quadrant of left female breast ([place]) 09/13/2015  . Left breast mass 07/12/2015  . H/O total thyroidectomy 06/20/2015  . Encounter for contraceptive management 04/28/2015  . Enlarged thyroid 09/06/2014  . Skin irritation 08/16/2014  . Status post cesarean delivery 07/30/2014  . Left sciatic nerve pain 04/26/2014  . Previous cesarean delivery, antepartum 12/30/2013  . Supervision of other high-risk pregnancy 12/30/2013  . History  of Hodgkin's disease 11/24/2012  . Hypothyroidism     Alexia Freestone 02/01/2016, 4:42 PM  [place], [place], [address] Phone: [phone]   Fax:  [phone]  Name: Bonnie Brown MRN: [id number] Date of Birth: October 27, 1980   Allyson Sabal, PT 02/01/16 4:42 PM

## 2016-02-08 ENCOUNTER — Ambulatory Visit: Payer: PRIVATE HEALTH INSURANCE | Admitting: Physical Therapy

## 2016-02-10 ENCOUNTER — Ambulatory Visit: Payer: PRIVATE HEALTH INSURANCE | Attending: General Surgery | Admitting: Physical Therapy

## 2016-02-10 ENCOUNTER — Encounter: Payer: Self-pay | Admitting: Physical Therapy

## 2016-02-10 DIAGNOSIS — M25611 Stiffness of right shoulder, not elsewhere classified: Secondary | ICD-10-CM | POA: Diagnosis not present

## 2016-02-10 DIAGNOSIS — M25612 Stiffness of left shoulder, not elsewhere classified: Secondary | ICD-10-CM | POA: Insufficient documentation

## 2016-02-10 NOTE — Therapy (Signed)
[place], [place], [address] Phone: [phone]   Fax:  [phone]  Physical Therapy Treatment  Patient Details  Name: REGANA KEMPLE MRN: [id number] Date of Birth: September 02, 1980 Referring Provider: Dr. Fanny Skates  Encounter Date: 02/10/2016      PT End of Session - 02/10/16 0845    Visit Number 13   Number of Visits 17   Date for PT Re-Evaluation 02/15/16   PT Start Time 0805   PT Stop Time 2409   PT Time Calculation (min) 39 min   Activity Tolerance Patient tolerated treatment well   Behavior During Therapy [facility] for tasks assessed/performed      Past Medical History:  Diagnosis Date  . Atypical hyperplasia of left breast 08/2015  . Breast disorder    +breast cancer  . Cancer [facility])    left breast  . Family history of colon cancer   . Family history of pancreatic cancer   . H/O bone density study   . H/O colonoscopy   . History of blood transfusion 1995  . History of chemotherapy age 58  . History of hodgkin's lymphoma age 62  . Hypertension    under control with med., has been on med. since 07/2015  . Hypothyroidism   . Irregular heartbeat    states due to radiation treatments - no med., has not seen cardiology since 2013  . Migraines     Past Surgical History:  Procedure Laterality Date  . BREAST BIOPSY Bilateral   . BREAST LUMPECTOMY WITH RADIOACTIVE SEED LOCALIZATION Left 09/06/2015   Procedure: BREAST LUMPECTOMY WITH RADIOACTIVE SEED LOCALIZATION;  Surgeon: Fanny Skates, MD;  Location: [place];  Service: General;  Laterality: Left;  . BREAST RECONSTRUCTION WITH PLACEMENT OF TISSUE EXPANDER AND FLEX HD (ACELLULAR HYDRATED DERMIS) Bilateral 12/12/2015   Procedure: BILATERAL BREAST RECONSTRUCTION WITH PLACEMENT OF TISSUE EXPANDERS AND FLEX HD (ACELLULAR HYDRATED DERMIS);  Surgeon: Loel Lofty Dillingham, DO;  Location: [place];  Service: Plastics;  Laterality:  Bilateral;  . CESAREAN SECTION N/A 11/04/2012   Procedure: Primary cesarean section with delivery of baby boy at 2007. apgars 8/9.;  Surgeon: Osborne Oman, MD;  Location: [place] [facility];  Service: Obstetrics;  Laterality: N/A;  . CESAREAN SECTION N/A 07/30/2014   Procedure: REPEAT CESAREAN SECTION;  Surgeon: Jonnie Kind, MD;  Location: [place] [facility];  Service: Obstetrics;  Laterality: N/A;  . LYMPH NODE BIOPSY  1992  . MASTECTOMY W/ SENTINEL NODE BIOPSY Bilateral 12/12/2015   Procedure: LEFT TOTAL MASTECTOMY WITH LEFT AXILLARY SENTINEL LYMPH NODE BIOPSY AND RIGHT PROPHYLACTIC MASTECTOMY;  Surgeon: Fanny Skates, MD;  Location: [place];  Service: General;  Laterality: Bilateral;  . TOTAL THYROIDECTOMY  05/17/2015  . WISDOM TOOTH EXTRACTION      There were no vitals filed for this visit.      Subjective Assessment - 02/10/16 0809    Subjective I have been driving all week for work with no increase in pain.    Pertinent History Early stage left breast cancer with bilateral mastectomies, left SLNB (4 nodes removed) on 12/12/15, all negative.  Had immediate reconstruction with expanders in place.  Has had two fills already.  No date for that surgery.  HTN controlled with meds.  Thyroid resected last November with precancer, and patient has a visible incision at anterior neck from that.   Patient Stated Goals Be able to use the left arm better.   Currently in Pain? No/denies  Pain Score 0-No pain                         [facility] Adult PT Treatment/Exercise - 02/10/16 0001      Shoulder Exercises: Supine   Horizontal ABduction Strengthening;Both;10 reps;Theraband   Theraband Level (Shoulder Horizontal ABduction) Level 4 (Blue)   External Rotation Strengthening;Both;10 reps;Theraband   Theraband Level (Shoulder External Rotation) Level 4 (Blue)   Flexion Strengthening;Both;Theraband;10 reps   Theraband Level (Shoulder Flexion) Level 4 (Blue)   Other Supine Exercises D2 x 10 bilat with blue  theraband     Shoulder Exercises: Standing   Flexion Strengthening;Both;20 reps;Weights   Shoulder Flexion Weight (lbs) 2   ABduction Strengthening;Both;20 reps;Weights   Shoulder ABduction Weight (lbs) 2   Row Strengthening;Both;12 reps;Weights   Row Weight (lbs) 4   Other Standing Exercises Bil scaption to 90 degrees with 2 lbs, 2 x10 with back against wall and core engaged   Other Standing Exercises Leaning on chair for tricep extensions with 4  lbs, x 10; rockwood Series in standing with green theraband x 10 each     Shoulder Exercises: Pulleys   Flexion 2 minutes   ABduction 2 minutes     Shoulder Exercises: ROM/Strengthening   Wall Pushups 20 reps  2 sets of 10   Other ROM/Strengthening Exercises standing wit w arms working to get forearm against wall with 30 sec holds x 2     Manual Therapy   Manual Therapy Passive ROM;Myofascial release;Joint mobilization   Joint Mobilization In left sidelying to R scapula into protraction and depression, in right sidelying to L scapula into protraction and depression   Myofascial Release bilateral UE pulling while moving arm into abduction   Passive ROM to bilateral UEs into flexion, abduction, and er to pt's tolerance.                         [facility] Goals - 02/10/16 0825      CC Long Term Goal  #1   Title Patient will show active shoulder flexion of at least 140 degrees bilat. for improved overhead reach   Baseline Rt 135 and Lt 127 degrees 01/11/16; Rt 138 and Lt 125 degrees 01/18/16, 01/25/16- L 150 deg, 150 on R   Status Achieved     CC Long Term Goal  #2   Title Active shoulder abduction bilateral of at least 120 degrees for improved ADLs   Baseline Rt 131 and Lt 119 01/11/16; Rt 142 and Lt 121 degrees 01/18/16, 01/25/16- 110 deg on L (pt can get more with some scaption), 115 R, 02/01/16- 148 on L, 172 on R   Status Achieved     CC Long Term Goal  #3   Title Patient will demonstrate ability to perform simulated  work tasks indicating she may be able to return to her job   Status Achieved     CC Long Term Goal  #4   Title Pt will demonstrate 165 degrees of left shoulder abduction to allow pt to reach items out to sides   Baseline 148 degrees, 02/10/16- 171 degrees   Status Achieved            Plan - 02/10/16 0846    Clinical Impression Statement Pt has now met all goals in therapy. She has regained full shoulder ROM and is independent with a home exercise program. Pt was given green band today for standing  shoulder exercises and blue band for supine scapular stabilization exercises with pt tolerating both well. She will be discharged from skilled PT services at this time.    Rehab Potential Excellent   PT Frequency 2x / week   PT Duration 2 weeks   PT Treatment/Interventions ADLs/Self Care Home Management;Electrical Stimulation;DME Instruction;Therapeutic exercise;Patient/family education;Manual techniques;Passive range of motion;Taping   PT Next Visit Plan d/c today   PT Home Exercise Plan Cont dowel exercises for both shoulder ROM and added supine scapular sereies with blue theraband, rockwood with green theraband   Consulted and Agree with Plan of Care Patient      Patient will benefit from skilled therapeutic intervention in order to improve the following deficits and impairments:  Decreased range of motion, Decreased strength, Impaired UE functional use  Visit Diagnosis: Stiffness of right shoulder, not elsewhere classified  Stiffness of left shoulder, not elsewhere classified     Problem List Patient Active Problem List   Diagnosis Date Noted  . Genetic testing 11/01/2015  . Family history of colon cancer   . Family history of pancreatic cancer   . Breast cancer of lower-inner quadrant of left female breast ([place]) 09/13/2015  . Left breast mass 07/12/2015  . H/O total thyroidectomy 06/20/2015  . Encounter for contraceptive management 04/28/2015  . Enlarged thyroid 09/06/2014   . Skin irritation 08/16/2014  . Status post cesarean delivery 07/30/2014  . Left sciatic nerve pain 04/26/2014  . Previous cesarean delivery, antepartum 12/30/2013  . Supervision of other high-risk pregnancy 12/30/2013  . History of Hodgkin's disease 11/24/2012  . Hypothyroidism     Alexia Freestone 02/10/2016, 8:48 AM  [place], [place], [address] Phone: [phone]   Fax:  [phone]  Name: TRACY GERKEN MRN: [id number] Date of Birth: 10/11/1980   PHYSICAL THERAPY DISCHARGE SUMMARY  Visits from Start of Care: 13  Current functional level related to goals / functional outcomes: Independent with home exercise program, regained full ROM    Remaining deficits: none   Education / Equipment: HEP Plan: Patient agrees to discharge.  Patient goals were met. Patient is being discharged due to meeting the stated rehab goals.  ?????

## 2016-02-24 ENCOUNTER — Telehealth: Payer: Self-pay | Admitting: Adult Health

## 2016-02-24 NOTE — Telephone Encounter (Signed)
She had questions about implants, saline vs silicone, I would go with saline

## 2016-03-22 ENCOUNTER — Encounter (HOSPITAL_BASED_OUTPATIENT_CLINIC_OR_DEPARTMENT_OTHER): Payer: Self-pay | Admitting: *Deleted

## 2016-03-22 NOTE — Pre-Procedure Instructions (Signed)
To go to [place] for BMET 

## 2016-03-23 ENCOUNTER — Other Ambulatory Visit: Payer: Self-pay | Admitting: Adult Health

## 2016-03-26 ENCOUNTER — Encounter (HOSPITAL_COMMUNITY)
Admission: RE | Admit: 2016-03-26 | Discharge: 2016-03-26 | Disposition: A | Discharge status: Home / Self Care | Payer: PRIVATE HEALTH INSURANCE | Source: Ambulatory Visit | Attending: Plastic Surgery | Admitting: Plastic Surgery

## 2016-03-26 DIAGNOSIS — Z853 Personal history of malignant neoplasm of breast: Secondary | ICD-10-CM | POA: Diagnosis not present

## 2016-03-26 DIAGNOSIS — Z8571 Personal history of Hodgkin lymphoma: Secondary | ICD-10-CM | POA: Diagnosis not present

## 2016-03-26 DIAGNOSIS — Z79899 Other long term (current) drug therapy: Secondary | ICD-10-CM | POA: Diagnosis not present

## 2016-03-26 DIAGNOSIS — Z9013 Acquired absence of bilateral breasts and nipples: Secondary | ICD-10-CM | POA: Diagnosis not present

## 2016-03-26 DIAGNOSIS — Z923 Personal history of irradiation: Secondary | ICD-10-CM | POA: Diagnosis not present

## 2016-03-26 DIAGNOSIS — E039 Hypothyroidism, unspecified: Secondary | ICD-10-CM | POA: Diagnosis not present

## 2016-03-26 DIAGNOSIS — Z9221 Personal history of antineoplastic chemotherapy: Secondary | ICD-10-CM | POA: Diagnosis not present

## 2016-03-26 DIAGNOSIS — I10 Essential (primary) hypertension: Secondary | ICD-10-CM | POA: Diagnosis not present

## 2016-03-26 DIAGNOSIS — Z421 Encounter for breast reconstruction following mastectomy: Secondary | ICD-10-CM | POA: Diagnosis present

## 2016-03-26 LAB — BASIC METABOLIC PANEL
Anion gap: 6 (ref 5–15)
BUN: 11 mg/dL (ref 6–20)
CHLORIDE: 104 mmol/L (ref 101–111)
CO2: 29 mmol/L (ref 22–32)
CREATININE: 0.7 mg/dL (ref 0.44–1.00)
Calcium: 8.5 mg/dL — ABNORMAL LOW (ref 8.9–10.3)
GFR calc non Af Amer: >60 mL/min (ref >60)
GLUCOSE: 92 mg/dL (ref 65–99)
Potassium: 3.6 mmol/L (ref 3.5–5.1)
Sodium: 139 mmol/L (ref 135–145)

## 2016-03-26 NOTE — Progress Notes (Signed)
Abnormal lab reviewed with dr singer no further treatment needed

## 2016-03-27 ENCOUNTER — Ambulatory Visit: Payer: Self-pay | Admitting: Plastic Surgery

## 2016-03-27 ENCOUNTER — Other Ambulatory Visit (HOSPITAL_COMMUNITY): Payer: PRIVATE HEALTH INSURANCE

## 2016-03-27 DIAGNOSIS — Z9013 Acquired absence of bilateral breasts and nipples: Secondary | ICD-10-CM

## 2016-03-29 ENCOUNTER — Encounter (HOSPITAL_BASED_OUTPATIENT_CLINIC_OR_DEPARTMENT_OTHER)
Admission: RE | Disposition: A | Discharge status: Home / Self Care | Payer: Self-pay | Source: Ambulatory Visit | Attending: Plastic Surgery

## 2016-03-29 ENCOUNTER — Ambulatory Visit (HOSPITAL_BASED_OUTPATIENT_CLINIC_OR_DEPARTMENT_OTHER)
Admission: RE | Admit: 2016-03-29 | Discharge: 2016-03-29 | Disposition: A | Discharge status: Home / Self Care | Payer: PRIVATE HEALTH INSURANCE | Source: Ambulatory Visit | Attending: Plastic Surgery | Admitting: Plastic Surgery

## 2016-03-29 ENCOUNTER — Encounter (HOSPITAL_BASED_OUTPATIENT_CLINIC_OR_DEPARTMENT_OTHER): Payer: Self-pay

## 2016-03-29 ENCOUNTER — Ambulatory Visit (HOSPITAL_BASED_OUTPATIENT_CLINIC_OR_DEPARTMENT_OTHER): Payer: PRIVATE HEALTH INSURANCE | Admitting: Anesthesiology

## 2016-03-29 DIAGNOSIS — I10 Essential (primary) hypertension: Secondary | ICD-10-CM | POA: Insufficient documentation

## 2016-03-29 DIAGNOSIS — Z923 Personal history of irradiation: Secondary | ICD-10-CM | POA: Insufficient documentation

## 2016-03-29 DIAGNOSIS — Z79899 Other long term (current) drug therapy: Secondary | ICD-10-CM | POA: Insufficient documentation

## 2016-03-29 DIAGNOSIS — Z9221 Personal history of antineoplastic chemotherapy: Secondary | ICD-10-CM | POA: Insufficient documentation

## 2016-03-29 DIAGNOSIS — Z421 Encounter for breast reconstruction following mastectomy: Secondary | ICD-10-CM | POA: Diagnosis not present

## 2016-03-29 DIAGNOSIS — Z8571 Personal history of Hodgkin lymphoma: Secondary | ICD-10-CM | POA: Insufficient documentation

## 2016-03-29 DIAGNOSIS — Z853 Personal history of malignant neoplasm of breast: Secondary | ICD-10-CM | POA: Insufficient documentation

## 2016-03-29 DIAGNOSIS — E039 Hypothyroidism, unspecified: Secondary | ICD-10-CM | POA: Insufficient documentation

## 2016-03-29 DIAGNOSIS — Z9013 Acquired absence of bilateral breasts and nipples: Secondary | ICD-10-CM | POA: Insufficient documentation

## 2016-03-29 HISTORY — PX: REMOVAL OF BILATERAL TISSUE EXPANDERS WITH PLACEMENT OF BILATERAL BREAST IMPLANTS: SHX6431

## 2016-03-29 HISTORY — PX: LIPOSUCTION: SHX10

## 2016-03-29 HISTORY — DX: Personal history of malignant neoplasm of breast: Z85.3

## 2016-03-29 SURGERY — REMOVAL, TISSUE EXPANDER, BREAST, BILATERAL, WITH BILATERAL IMPLANT IMPLANT INSERTION
Anesthesia: General | Site: Chest | Laterality: Left

## 2016-03-29 MED ORDER — ARTIFICIAL TEARS OP OINT
TOPICAL_OINTMENT | OPHTHALMIC | Status: AC
  Filled 2016-03-29: qty 3.5

## 2016-03-29 MED ORDER — SODIUM CHLORIDE 0.9 % IR SOLN: Status: DC | PRN

## 2016-03-29 MED ORDER — HYDROMORPHONE HCL 1 MG/ML IJ SOLN
INTRAMUSCULAR | Status: AC
  Filled 2016-03-29: qty 1

## 2016-03-29 MED ORDER — EPHEDRINE 5 MG/ML INJ
INTRAVENOUS | Status: AC
  Filled 2016-03-29: qty 10

## 2016-03-29 MED ORDER — SODIUM CHLORIDE 0.9 % IJ SOLN
INTRAMUSCULAR | Status: AC
  Filled 2016-03-29: qty 10

## 2016-03-29 MED ORDER — SUGAMMADEX SODIUM 200 MG/2ML IV SOLN
INTRAVENOUS | Status: DC | PRN
  Administered 2016-03-29: 160 mg via INTRAVENOUS

## 2016-03-29 MED ORDER — ROCURONIUM BROMIDE 100 MG/10ML IV SOLN
INTRAVENOUS | Status: DC | PRN
  Administered 2016-03-29: 50 mg via INTRAVENOUS

## 2016-03-29 MED ORDER — GLYCOPYRROLATE 0.2 MG/ML IJ SOLN: 0.2000 mg | Freq: Once | INTRAMUSCULAR | Status: DC | PRN

## 2016-03-29 MED ORDER — PROPOFOL 10 MG/ML IV BOLUS
INTRAVENOUS | Status: AC
  Filled 2016-03-29: qty 20

## 2016-03-29 MED ORDER — CIPROFLOXACIN IN D5W 400 MG/200ML IV SOLN
INTRAVENOUS | Status: AC
  Filled 2016-03-29: qty 200

## 2016-03-29 MED ORDER — SODIUM BICARBONATE 4 % IV SOLN
INTRAVENOUS | Status: AC
  Filled 2016-03-29: qty 10

## 2016-03-29 MED ORDER — SUFENTANIL CITRATE 50 MCG/ML IV SOLN
INTRAVENOUS | Status: AC
  Filled 2016-03-29: qty 1

## 2016-03-29 MED ORDER — DEXAMETHASONE SODIUM PHOSPHATE 4 MG/ML IJ SOLN
INTRAMUSCULAR | Status: DC | PRN
  Administered 2016-03-29: 10 mg via INTRAVENOUS

## 2016-03-29 MED ORDER — BUPIVACAINE-EPINEPHRINE 0.25% -1:200000 IJ SOLN
INTRAMUSCULAR | Status: DC | PRN
  Administered 2016-03-29: 10 mL

## 2016-03-29 MED ORDER — PHENYLEPHRINE HCL 10 MG/ML IJ SOLN
INTRAMUSCULAR | Status: DC | PRN
  Administered 2016-03-29: 40 ug via INTRAVENOUS

## 2016-03-29 MED ORDER — DEXAMETHASONE SODIUM PHOSPHATE 10 MG/ML IJ SOLN
INTRAMUSCULAR | Status: AC
  Filled 2016-03-29: qty 1

## 2016-03-29 MED ORDER — ONDANSETRON HCL 4 MG/2ML IJ SOLN
INTRAMUSCULAR | Status: AC
  Filled 2016-03-29: qty 2

## 2016-03-29 MED ORDER — CHLORHEXIDINE GLUCONATE CLOTH 2 % EX PADS: 6.0000 | MEDICATED_PAD | Freq: Once | CUTANEOUS | Status: DC

## 2016-03-29 MED ORDER — HYDROMORPHONE HCL 1 MG/ML IJ SOLN
0.2500 mg | INTRAMUSCULAR | Status: DC | PRN
  Administered 2016-03-29 (×2): 0.5 mg via INTRAVENOUS

## 2016-03-29 MED ORDER — LIDOCAINE HCL (PF) 1 % IJ SOLN
INTRAMUSCULAR | Status: AC
Start: 2016-03-29 — End: 2016-03-29
  Filled 2016-03-29: qty 60

## 2016-03-29 MED ORDER — EPINEPHRINE HCL 1 MG/ML IJ SOLN
INTRAMUSCULAR | Status: AC
  Filled 2016-03-29: qty 1

## 2016-03-29 MED ORDER — FENTANYL CITRATE (PF) 100 MCG/2ML IJ SOLN: 50.0000 ug | INTRAMUSCULAR | Status: DC | PRN

## 2016-03-29 MED ORDER — SODIUM CHLORIDE 0.9 % IJ SOLN
INTRAMUSCULAR | Status: AC
  Filled 2016-03-29: qty 40

## 2016-03-29 MED ORDER — LIDOCAINE 2% (20 MG/ML) 5 ML SYRINGE
INTRAMUSCULAR | Status: AC
  Filled 2016-03-29: qty 5

## 2016-03-29 MED ORDER — MIDAZOLAM HCL 2 MG/2ML IJ SOLN
INTRAMUSCULAR | Status: AC
  Filled 2016-03-29: qty 2

## 2016-03-29 MED ORDER — SUCCINYLCHOLINE CHLORIDE 200 MG/10ML IV SOSY
PREFILLED_SYRINGE | INTRAVENOUS | Status: AC
Start: 2016-03-29 — End: 2016-03-29
  Filled 2016-03-29: qty 10

## 2016-03-29 MED ORDER — DIPHENHYDRAMINE HCL 50 MG/ML IJ SOLN
INTRAMUSCULAR | Status: DC | PRN
  Administered 2016-03-29: 25 mg via INTRAVENOUS

## 2016-03-29 MED ORDER — MIDAZOLAM HCL 2 MG/2ML IJ SOLN
1.0000 mg | INTRAMUSCULAR | Status: DC | PRN
  Administered 2016-03-29: 2 mg via INTRAVENOUS

## 2016-03-29 MED ORDER — LACTATED RINGERS IV SOLN
INTRAVENOUS | Status: DC
  Administered 2016-03-29 (×2): via INTRAVENOUS

## 2016-03-29 MED ORDER — PROPOFOL 10 MG/ML IV BOLUS
INTRAVENOUS | Status: DC | PRN
  Administered 2016-03-29: 150 mg via INTRAVENOUS

## 2016-03-29 MED ORDER — PHENYLEPHRINE 40 MCG/ML (10ML) SYRINGE FOR IV PUSH (FOR BLOOD PRESSURE SUPPORT)
PREFILLED_SYRINGE | INTRAVENOUS | Status: AC
Start: 2016-03-29 — End: 2016-03-29
  Filled 2016-03-29: qty 10

## 2016-03-29 MED ORDER — SUFENTANIL CITRATE 50 MCG/ML IV SOLN
INTRAVENOUS | Status: DC | PRN
  Administered 2016-03-29: 5 ug via INTRAVENOUS
  Administered 2016-03-29: 20 ug via INTRAVENOUS

## 2016-03-29 MED ORDER — SCOPOLAMINE 1 MG/3DAYS TD PT72: 1.0000 | MEDICATED_PATCH | Freq: Once | TRANSDERMAL | Status: DC | PRN

## 2016-03-29 MED ORDER — CIPROFLOXACIN IN D5W 400 MG/200ML IV SOLN
400.0000 mg | INTRAVENOUS | Status: AC
  Administered 2016-03-29: 400 mg via INTRAVENOUS

## 2016-03-29 MED ORDER — LIDOCAINE HCL (CARDIAC) 20 MG/ML IV SOLN
INTRAVENOUS | Status: DC | PRN
  Administered 2016-03-29: 30 mg via INTRAVENOUS

## 2016-03-29 SURGICAL SUPPLY — 67 items
BAG DECANTER FOR FLEXI CONT (MISCELLANEOUS) ×4 IMPLANT
BINDER BREAST LRG (GAUZE/BANDAGES/DRESSINGS) ×4 IMPLANT
BINDER BREAST MEDIUM (GAUZE/BANDAGES/DRESSINGS) IMPLANT
BINDER BREAST XLRG (GAUZE/BANDAGES/DRESSINGS) IMPLANT
BINDER BREAST XXLRG (GAUZE/BANDAGES/DRESSINGS) IMPLANT
BIOPATCH RED 1 DISK 7.0 (GAUZE/BANDAGES/DRESSINGS) IMPLANT
BIOPATCH RED 1IN DISK 7.0MM (GAUZE/BANDAGES/DRESSINGS)
BLADE HEX COATED 2.75 (ELECTRODE) ×4 IMPLANT
BLADE SURG 15 STRL LF DISP TIS (BLADE) ×4 IMPLANT
BLADE SURG 15 STRL SS (BLADE) ×4
BNDG GAUZE ELAST 4 BULKY (GAUZE/BANDAGES/DRESSINGS) ×8 IMPLANT
CANISTER SUCT 1200ML W/VALVE (MISCELLANEOUS) ×8 IMPLANT
CHLORAPREP W/TINT 26ML (MISCELLANEOUS) ×4 IMPLANT
CORDS BIPOLAR (ELECTRODE) IMPLANT
COVER BACK TABLE 60X90IN (DRAPES) ×4 IMPLANT
COVER MAYO STAND STRL (DRAPES) ×4 IMPLANT
DECANTER SPIKE VIAL GLASS SM (MISCELLANEOUS) IMPLANT
DERMABOND ADVANCED (GAUZE/BANDAGES/DRESSINGS)
DERMABOND ADVANCED .7 DNX12 (GAUZE/BANDAGES/DRESSINGS) IMPLANT
DRAIN CHANNEL 19F RND (DRAIN) IMPLANT
DRAPE LAPAROSCOPIC ABDOMINAL (DRAPES) ×4 IMPLANT
DRSG PAD ABDOMINAL 8X10 ST (GAUZE/BANDAGES/DRESSINGS) ×8 IMPLANT
ELECT BLADE 4.0 EZ CLEAN MEGAD (MISCELLANEOUS) ×4
ELECT REM PT RETURN 9FT ADLT (ELECTROSURGICAL) ×4
ELECTRODE BLDE 4.0 EZ CLN MEGD (MISCELLANEOUS) ×2 IMPLANT
ELECTRODE REM PT RTRN 9FT ADLT (ELECTROSURGICAL) ×2 IMPLANT
EVACUATOR SILICONE 100CC (DRAIN) IMPLANT
GLOVE BIO SURGEON STRL SZ 6.5 (GLOVE) ×9 IMPLANT
GLOVE BIO SURGEONS STRL SZ 6.5 (GLOVE) ×3
GOWN STRL REUS W/ TWL LRG LVL3 (GOWN DISPOSABLE) ×8 IMPLANT
GOWN STRL REUS W/TWL LRG LVL3 (GOWN DISPOSABLE) ×8
IMPL BREAST SMOOTH UH 455CC (Breast) ×4 IMPLANT
IMPLANT BREAST SMOOTH UH 455CC (Breast) ×8 IMPLANT
IV NS 1000ML (IV SOLUTION)
IV NS 1000ML BAXH (IV SOLUTION) IMPLANT
IV NS 500ML (IV SOLUTION)
IV NS 500ML BAXH (IV SOLUTION) IMPLANT
KIT FILL SYSTEM UNIVERSAL (SET/KITS/TRAYS/PACK) IMPLANT
NDL SAFETY ECLIPSE 18X1.5 (NEEDLE) ×2 IMPLANT
NEEDLE HYPO 18GX1.5 SHARP (NEEDLE) ×2
NEEDLE HYPO 25X1 1.5 SAFETY (NEEDLE) ×4 IMPLANT
PACK BASIN DAY SURGERY FS (CUSTOM PROCEDURE TRAY) ×4 IMPLANT
PENCIL BUTTON HOLSTER BLD 10FT (ELECTRODE) ×4 IMPLANT
PIN SAFETY STERILE (MISCELLANEOUS) IMPLANT
SIZER BREAST REUSE 400CC (SIZER) ×2
SIZER BREAST REUSE 455CC (SIZER) ×4
SIZER BRST REUSE 12.2 400CC (SIZER) ×2 IMPLANT
SIZER BRST REUSE 455CC (SIZER) ×2 IMPLANT
SLEEVE SCD COMPRESS KNEE MED (MISCELLANEOUS) ×4 IMPLANT
SPONGE GAUZE 4X4 12PLY STER LF (GAUZE/BANDAGES/DRESSINGS) IMPLANT
SPONGE LAP 18X18 X RAY DECT (DISPOSABLE) ×8 IMPLANT
SUT MNCRL AB 4-0 PS2 18 (SUTURE) ×12 IMPLANT
SUT MON AB 3-0 SH 27 (SUTURE) ×4
SUT MON AB 3-0 SH27 (SUTURE) ×4 IMPLANT
SUT MON AB 5-0 PS2 18 (SUTURE) ×8 IMPLANT
SUT PDS AB 2-0 CT2 27 (SUTURE) IMPLANT
SUT VIC AB 3-0 SH 27 (SUTURE)
SUT VIC AB 3-0 SH 27X BRD (SUTURE) IMPLANT
SUT VICRYL 4-0 PS2 18IN ABS (SUTURE) IMPLANT
SYR 20CC LL (SYRINGE) ×8 IMPLANT
SYR BULB IRRIGATION 50ML (SYRINGE) ×4 IMPLANT
SYR CONTROL 10ML LL (SYRINGE) ×4 IMPLANT
TOWEL OR 17X24 6PK STRL BLUE (TOWEL DISPOSABLE) ×8 IMPLANT
TUBE CONNECTING 20'X1/4 (TUBING) ×1
TUBE CONNECTING 20X1/4 (TUBING) ×3 IMPLANT
UNDERPAD 30X30 (UNDERPADS AND DIAPERS) ×16 IMPLANT
YANKAUER SUCT BULB TIP NO VENT (SUCTIONS) ×4 IMPLANT

## 2016-03-29 NOTE — Anesthesia Procedure Notes (Addendum)
Procedure Name: Intubation Date/Time: 03/29/2016 10:53 AM Performed by: Melynda Ripple D Pre-anesthesia Checklist: Patient identified, Emergency Drugs available, Suction available and Patient being monitored Patient Re-evaluated:Patient Re-evaluated prior to inductionOxygen Delivery Method: Circle system utilized Preoxygenation: Pre-oxygenation with 100% oxygen Intubation Type: IV induction Ventilation: Mask ventilation without difficulty Laryngoscope Size: Mac and 3 Grade View: Grade I Tube type: Oral Tube size: 7.0 mm Number of attempts: 1 Airway Equipment and Method: Stylet and Oral airway Placement Confirmation: ETT inserted through vocal cords under direct vision,  positive ETCO2 and breath sounds checked- equal and bilateral Secured at: 23 cm Tube secured with: Tape Dental Injury: Teeth and Oropharynx as per pre-operative assessment

## 2016-03-29 NOTE — H&P (Signed)
Bonnie Brown is an 35 y.o. female.   Chief Complaint: acquired absence of bilateral breast HPI: Bonnie Brown is a 35yo female seen for post operative follow up following bilateral mastectomies and immediate breast reconstruction for left breast cancer. History:  She felt a lesion on the left breast and went for a mammogram. She was radiated at the age of 59 yrs for hodgkin's disease (1992). After ultrasound and MRI she was diagnosed with atypical ductal hyperplasia with calcifications and lobular carcinoma in situ. She has significant ptosis of her breasts.She has two children and her husband's mother and sister have just been treated for breast cancer/BRCA positive. She underwent left total mastectomy with left axillary sentinel lymph node biopsy and right prophylactic mastectomy followed by immediate bilateral reconstruction with tissue expanders and ADM on 12/12/15.  She has 410/350 cc in her expanders  Past Medical History:  Diagnosis Date  . History of blood transfusion 1995  . History of breast cancer 08/2015  . History of chemotherapy age 51  . History of hodgkin's lymphoma age 19  . Hypertension    under control with med., has been on med. since 04/2015  . Hypothyroidism   . Irregular heartbeat    states due to radiation treatments - no med., has not seen cardiology since 08/2013  . Migraines     Past Surgical History:  Procedure Laterality Date  . BREAST LUMPECTOMY WITH RADIOACTIVE SEED LOCALIZATION Left 09/06/2015   Procedure: BREAST LUMPECTOMY WITH RADIOACTIVE SEED LOCALIZATION;  Surgeon: Fanny Skates, MD;  Location: [place];  Service: General;  Laterality: Left;  . BREAST RECONSTRUCTION WITH PLACEMENT OF TISSUE EXPANDER AND FLEX HD (ACELLULAR HYDRATED DERMIS) Bilateral 12/12/2015   Procedure: BILATERAL BREAST RECONSTRUCTION WITH PLACEMENT OF TISSUE EXPANDERS AND FLEX HD (ACELLULAR HYDRATED DERMIS);  Surgeon: Loel Lofty Dillingham, DO;  Location: [place];   Service: Plastics;  Laterality: Bilateral;  . CESAREAN SECTION N/A 11/04/2012   Procedure: Primary cesarean section with delivery of baby boy at 2007. apgars 8/9.;  Surgeon: Osborne Oman, MD;  Location: [place] [facility];  Service: Obstetrics;  Laterality: N/A;  . CESAREAN SECTION N/A 07/30/2014   Procedure: REPEAT CESAREAN SECTION;  Surgeon: Jonnie Kind, MD;  Location: [place] [facility];  Service: Obstetrics;  Laterality: N/A;  . LYMPH NODE BIOPSY  1992  . MASTECTOMY W/ SENTINEL NODE BIOPSY Bilateral 12/12/2015   Procedure: LEFT TOTAL MASTECTOMY WITH LEFT AXILLARY SENTINEL LYMPH NODE BIOPSY AND RIGHT PROPHYLACTIC MASTECTOMY;  Surgeon: Fanny Skates, MD;  Location: [place];  Service: General;  Laterality: Bilateral;  . TOTAL THYROIDECTOMY  05/16/2015  . WISDOM TOOTH EXTRACTION      Family History  Problem Relation Age of Onset  . Coronary artery disease Paternal Grandfather   . Diabetes Maternal Grandmother   . Stroke Maternal Grandmother   . Coronary artery disease Maternal Grandmother   . Pancreatic cancer Maternal Grandmother     dx in her 70s  . Coronary artery disease Father   . Hypertension Brother   . Hypertension Mother   . Colon cancer Paternal Aunt     dx in her 60s-70s   Social History:  reports that she has never smoked. She has never used smokeless tobacco. She reports that she does not drink alcohol or use drugs.  Allergies:  Allergies  Allergen Reactions  . Seldane [Terfenadine] Other (See Comments)    INTERACTED WITH CHEMO DRUGS  . Vancomycin Itching, Rash and Other (See Comments)    FLUSHING OF SKIN   .  Adhesive [Tape] Itching and Other (See Comments)    SKIN REDNESS  . Codeine Rash  . Penicillins Rash         No prescriptions prior to admission.    No results found for this or any previous visit (from the past 48 hour(s)). No results found.  Review of Systems  Constitutional: Negative.   HENT: Negative.   Eyes: Negative.   Respiratory: Negative.   Cardiovascular:  Negative.   Gastrointestinal: Negative.   Genitourinary: Negative.   Musculoskeletal: Negative.   Skin: Negative.   Neurological: Negative.   Psychiatric/Behavioral: Negative.     Height _0  (1.626 m), weight 75.8 kg (167 lb), last menstrual period 03/21/2016, not currently breastfeeding. Physical Exam  Constitutional: She is oriented to person, place, and time. She appears well-developed and well-nourished.  HENT:  Head: Normocephalic and atraumatic.  Eyes: EOM are normal.  Cardiovascular: Normal rate.   Respiratory: Effort normal.  GI: Soft. She exhibits no distension. There is no tenderness.  Neurological: She is alert and oriented to person, place, and time.  Skin: Skin is warm.  Psychiatric: She has a normal mood and affect. Her behavior is normal. Judgment and thought content normal.     Assessment/Plan Bilateral exchange of expanders for breast implants.  Wallace Going, DO 03/29/2016, 7:28 AM

## 2016-03-29 NOTE — Interval H&P Note (Signed)
History and Physical Interval Note:  03/29/2016 10:13 AM  Bonnie Brown  has presented today for surgery, with the diagnosis of breast cancer  The various methods of treatment have been discussed with the patient and family. After consideration of risks, benefits and other options for treatment, the patient has consented to  Procedure(s): REMOVAL OF BILATERAL TISSUE EXPANDERS WITH PLACEMENT OF BILATERAL SILCONE BREAST IMPLANTS (Bilateral) as a surgical intervention .  The patient's history has been reviewed, patient examined, no change in status, stable for surgery.  I have reviewed the patient's chart and labs.  Questions were answered to the patient's satisfaction.     Wallace Going

## 2016-03-29 NOTE — Transfer of Care (Signed)
Immediate Anesthesia Transfer of Care Note  Patient: Bonnie Brown  Procedure(s) Performed: Procedure(s): REMOVAL OF BILATERAL TISSUE EXPANDERS WITH PLACEMENT OF BILATERAL SILCONE BREAST IMPLANTS (Bilateral) LIPOSUCTION (Left)  Patient Location: PACU  Anesthesia Type:General  Level of Consciousness: awake, alert  and oriented  Airway & Oxygen Therapy: Patient Spontanous Breathing and Patient connected to face mask oxygen  Post-op Assessment: Report given to RN and Post -op Vital signs reviewed and stable  Post vital signs: Reviewed and stable  Last Vitals:  Vitals:   03/29/16 0859  BP: (!) 149/85  Pulse: 92  Resp: 18  Temp: 36.8 C    Last Pain:  Vitals:   03/29/16 0859  TempSrc: Oral         Complications: No apparent anesthesia complications

## 2016-03-29 NOTE — Op Note (Signed)
Op report Bilateral Exchange   DATE OF OPERATION: 03/29/2016  LOCATION: [place]  SURGICAL DIVISION: Plastic Surgery  PREOPERATIVE DIAGNOSES:  1. History of breast cancer.  2. Acquired absence of bilateral breast.   POSTOPERATIVE DIAGNOSES:  1. History of breast cancer.  2. Acquired absence of bilateral breast.   PROCEDURE:  1. Bilateral exchange of tissue expanders for implants.  2. Bilateral capsulotomies for implant respositioning. 3. Left breast liposuction.  SURGEON: Alahia Whicker Sanger Treyshaun Keatts, DO  ASSISTANT: Shawn Rayburn, PA  ANESTHESIA:  General.   COMPLICATIONS: None.   IMPLANTS: Left - Mentor Smooth Round Ultra High Profile Gel 455cc. Ref [id number].  Serial Number [id number] - Mentor Smooth Round Ultra High Profile Gel 455cc. Ref [id number].  Serial Number [id number]  INDICATIONS FOR PROCEDURE:  The patient, Bonnie Brown, is a 35 y.o. female born on 1980-10-04, is here for treatment after bilateral mastectomies.  She had tissue expanders placed at the time of mastectomies. She now presents for exchange of her expanders for implants.  She requires capsulotomies to better position the implants. MRN: [id number]  CONSENT:  Informed consent was obtained directly from the patient. Risks, benefits and alternatives were fully discussed. Specific risks including but not limited to bleeding, infection, hematoma, seroma, scarring, pain, implant infection, implant extrusion, capsular contracture, asymmetry, wound healing problems, and need for further surgery were all discussed. The patient did have an ample opportunity to have her questions answered to her satisfaction.   DESCRIPTION OF PROCEDURE:  The patient was taken to the operating room. SCDs were placed and IV antibiotics were given. The patient's chest was prepped and draped in a sterile fashion. A time out was performed and the implants to be used were identified.    On the right  breast: One percent Lidocaine with epinephrine was used to infiltrate at the incision site. The old mastectomy scar was excised.  The mastectomy flaps from the superior and inferior flaps were raised over the pectoralis major muscle for several centimeters to minimize tension for the closure. The pectoralis was split inferior to the skin incision to expose and remove the tissue expander.  Inspection of the pocket showed a normal healthy capsule and good integration of the biologic matrix.  The pocket was irrigated with antibiotic solution.  Circumferential capsulotomies were performed to allow for breast pocket expansion.  Measurements were made and a sizer used to confirm adequate pocket size for the implant dimensions.  Hemostasis was ensured with electrocautery. New gloves were placed. The implant was soaked in antibiotic solution and then placed in the pocket and oriented appropriately. The pectoralis major muscle and capsule on the anterior surface were re-closed with a 3-0 Monocryl suture. The remaining skin was closed with 4-0 Monocryl deep dermal and 5-0 Monocryl subcuticular stitches.   On the left breast: The old mastectomy scar was excised.  The mastectomy flaps from the superior and inferior flaps were raised over the pectoralis major muscle for several centimeters to minimize tension for the closure. The pectoralis was split inferior to the skin incision to expose and remove the tissue expander.  Inspection of the pocket showed a normal healthy capsule and good integration of the biologic matrix. A small area of the lateral and medial breast had liposuction for improved contour.  Circumferential capsulotomies were performed to allow for breast pocket expansion.  Measurements were made and a sizer utilized to confirm adequate pocket size for the implant dimensions.  Hemostasis was ensured with the electrocautery.  New  gloves were applied. The implant was soaked in antibiotic solution and placed in  the pocket and oriented appropriately. The pectoralis major muscle and capsule on the anterior surface were re-closed with a 3-0 Monocryl suture. The remaining skin was closed with 4-0 Monocryl deep dermal and 5-0 Monocryl subcuticular stitches.  Dermabond was applied to the incision site. A breast binder and ABDs were placed.  The patient was allowed to wake from anesthesia and taken to the recovery room in satisfactory condition.

## 2016-03-29 NOTE — Discharge Instructions (Signed)
No heavy lifting May shower tomorrow Continue breast binder  Post Anesthesia Home Care Instructions  Activity: Get plenty of rest for the remainder of the day. A responsible adult should stay with you for 24 hours following the procedure.  For the next 24 hours, DO NOT: -Drive a car -Paediatric nurse -Drink alcoholic beverages -Take any medication unless instructed by your physician -Make any legal decisions or sign important papers.  Meals: Start with liquid foods such as gelatin or soup. Progress to regular foods as tolerated. Avoid greasy, spicy, heavy foods. If nausea and/or vomiting occur, drink only clear liquids until the nausea and/or vomiting subsides. Call your physician if vomiting continues.  Special Instructions/Symptoms: Your throat may feel dry or sore from the anesthesia or the breathing tube placed in your throat during surgery. If this causes discomfort, gargle with warm salt water. The discomfort should disappear within 24 hours.  If you had a scopolamine patch placed behind your ear for the management of post- operative nausea and/or vomiting:  1. The medication in the patch is effective for 72 hours, after which it should be removed.  Wrap patch in a tissue and discard in the trash. Wash hands thoroughly with soap and water. 2. You may remove the patch earlier than 72 hours if you experience unpleasant side effects which may include dry mouth, dizziness or visual disturbances. 3. Avoid touching the patch. Wash your hands with soap and water after contact with the patch.

## 2016-03-29 NOTE — Anesthesia Postprocedure Evaluation (Signed)
Anesthesia Post Note  Patient: Bonnie Brown  Procedure(s) Performed: Procedure(s) (LRB): REMOVAL OF BILATERAL TISSUE EXPANDERS WITH PLACEMENT OF BILATERAL SILCONE BREAST IMPLANTS (Bilateral) LIPOSUCTION (Left)  Patient location during evaluation: PACU Anesthesia Type: General Level of consciousness: awake and alert Pain management: pain level controlled Vital Signs Assessment: post-procedure vital signs reviewed and stable Respiratory status: spontaneous breathing, nonlabored ventilation and respiratory function stable Cardiovascular status: blood pressure returned to baseline and stable Postop Assessment: no signs of nausea or vomiting Anesthetic complications: no    Last Vitals:  Vitals:   03/29/16 1250 03/29/16 1345  BP: (!) 146/88 (!) 146/95  Pulse: (!) 116 81  Resp: 18 15  Temp: 36.7 C     Last Pain:  Vitals:   03/29/16 1345  TempSrc:   PainSc: 4                  Denasia Venn,W. EDMOND

## 2016-03-29 NOTE — Anesthesia Preprocedure Evaluation (Addendum)
Anesthesia Evaluation  Patient identified by MRN, date of birth, ID band Patient awake    Reviewed: Allergy & Precautions, H&P , NPO status , Patient's Chart, lab work & pertinent test results  Airway Mallampati: II  TM Distance: >3 FB Neck ROM: Full    Dental no notable dental hx. (+) Teeth Intact, Dental Advisory Given   Pulmonary neg pulmonary ROS,    Pulmonary exam normal breath sounds clear to auscultation       Cardiovascular hypertension, Pt. on medications  Rhythm:Regular Rate:Normal     Neuro/Psych  Headaches, negative psych ROS   GI/Hepatic negative GI ROS, Neg liver ROS,   Endo/Other  Hypothyroidism   Renal/GU negative Renal ROS  negative genitourinary   Musculoskeletal   Abdominal   Peds  Hematology negative hematology ROS (+)   Anesthesia Other Findings   Reproductive/Obstetrics negative OB ROS                            Anesthesia Physical Anesthesia Plan  ASA: II  Anesthesia Plan: General   Post-op Pain Management:    Induction: Intravenous  Airway Management Planned: LMA  Additional Equipment:   Intra-op Plan:   Post-operative Plan: Extubation in OR  Informed Consent: I have reviewed the patients History and Physical, chart, labs and discussed the procedure including the risks, benefits and alternatives for the proposed anesthesia with the patient or authorized representative who has indicated his/her understanding and acceptance.   Dental advisory given  Plan Discussed with: CRNA  Anesthesia Plan Comments:         Anesthesia Quick Evaluation

## 2016-03-30 ENCOUNTER — Encounter (HOSPITAL_BASED_OUTPATIENT_CLINIC_OR_DEPARTMENT_OTHER): Payer: Self-pay | Admitting: Plastic Surgery

## 2016-04-03 DIAGNOSIS — Z9889 Other specified postprocedural states: Secondary | ICD-10-CM | POA: Insufficient documentation

## 2016-05-03 ENCOUNTER — Other Ambulatory Visit: Payer: Self-pay | Admitting: Adult Health

## 2016-06-19 ENCOUNTER — Encounter: Payer: Self-pay | Admitting: Adult Health

## 2016-06-19 ENCOUNTER — Ambulatory Visit (INDEPENDENT_AMBULATORY_CARE_PROVIDER_SITE_OTHER): Payer: PRIVATE HEALTH INSURANCE | Admitting: Adult Health

## 2016-06-19 ENCOUNTER — Encounter: Payer: PRIVATE HEALTH INSURANCE | Admitting: Nurse Practitioner

## 2016-06-19 ENCOUNTER — Other Ambulatory Visit: Payer: PRIVATE HEALTH INSURANCE | Admitting: Adult Health

## 2016-06-19 ENCOUNTER — Other Ambulatory Visit (HOSPITAL_COMMUNITY)
Admission: RE | Admit: 2016-06-19 | Discharge: 2016-06-19 | Disposition: A | Discharge status: Home / Self Care | Payer: PRIVATE HEALTH INSURANCE | Source: Ambulatory Visit | Attending: Adult Health | Admitting: Adult Health

## 2016-06-19 VITALS — BP 132/88 | HR 90 | Ht 63.0 in | Wt 181.6 lb

## 2016-06-19 DIAGNOSIS — Z8571 Personal history of Hodgkin lymphoma: Secondary | ICD-10-CM

## 2016-06-19 DIAGNOSIS — Z853 Personal history of malignant neoplasm of breast: Secondary | ICD-10-CM | POA: Insufficient documentation

## 2016-06-19 DIAGNOSIS — Z1151 Encounter for screening for human papillomavirus (HPV): Secondary | ICD-10-CM | POA: Insufficient documentation

## 2016-06-19 DIAGNOSIS — I10 Essential (primary) hypertension: Secondary | ICD-10-CM | POA: Insufficient documentation

## 2016-06-19 DIAGNOSIS — E890 Postprocedural hypothyroidism: Secondary | ICD-10-CM

## 2016-06-19 DIAGNOSIS — Z01419 Encounter for gynecological examination (general) (routine) without abnormal findings: Secondary | ICD-10-CM | POA: Insufficient documentation

## 2016-06-19 MED ORDER — HYDROCHLOROTHIAZIDE 12.5 MG PO CAPS: 12.5000 mg | ORAL_CAPSULE | Freq: Every day | ORAL | 12 refills | Status: DC

## 2016-06-19 NOTE — Progress Notes (Signed)
Patient ID: [name], female   DOB: 03-Apr-1981, 35 y.o.   MRN: [id number] History of Present Illness:  Bonnie Brown is a 35 year old white female married, in for well woman gyn exam and pap. PCP is Bonnie Brown in [place].  Current Medications, Allergies, Past Medical History, Past Surgical History, Family History and Social History were reviewed in [employer] record.     Review of Systems: Patient denies any daily headaches, hearing loss, fatigue, blurred vision, shortness of breath, chest pain, abdominal pain, problems with bowel movements, urination, or intercourse. No joint pain or mood swings.Using condoms     Physical Exam:BP 132/88   Pulse 90   Ht 5\' 3"  (1.6 m)   Wt 181 lb 9.6 oz (82.4 kg)   LMP 06/05/2016   BMI 32.17 kg/m  General:  Well developed, well nourished, no acute distress Skin:  Warm and dry Neck:  Midline trachea, thyroid absent, good ROM, no lymphadenopathy Lungs; Clear to auscultation bilaterally Breast: Absent, has implants sp mastectomy and is going to have left enhanced due to shape Cardiovascular: Regular rate and rhythm Abdomen:  Soft, non tender, no hepatosplenomegaly Pelvic:  External genitalia is normal in appearance, no lesions.  The vagina is normal in appearance. Urethra has no lesions or masses. The cervix is smooth, pap with HPV performed.  Uterus is felt to be normal size, shape, and contour.  No adnexal masses or tenderness noted.Bladder is non tender, no masses felt. Extremities/musculoskeletal:  No swelling or varicosities noted, no clubbing or cyanosis Psych:  No mood changes, alert and cooperative,seems happy PHQ 2 score 0  Impression: 1. Encounter for routine gynecological examination with Papanicolaou smear of cervix   2. Postoperative hypothyroidism   3. Essential hypertension   4. Cervical smear, as part of routine gynecological examination   5. History of Hodgkins  6. History of breast cancer     Plan: Check CBC,CMP,TSH and lipids.Free T4, A1c and vitamin D Meds ordered this encounter  Medications  . hydrochlorothiazide (MICROZIDE) 12.5 MG capsule    Sig: Take 1 capsule (12.5 mg total) by mouth daily.    Dispense:  30 capsule    Refill:  12    Generic [id number]   12.5MG   05/03/2016 5:08:53 PM    Order Specific Question:   Supervising Provider    Answer:   Florian Buff [[id number]]  Will wait for TSH results then refill synthroid Physical in 1 year, pap in 2020 if normal

## 2016-06-20 ENCOUNTER — Telehealth: Payer: Self-pay | Admitting: Adult Health

## 2016-06-20 ENCOUNTER — Encounter: Payer: Self-pay | Admitting: Adult Health

## 2016-06-20 DIAGNOSIS — E559 Vitamin D deficiency, unspecified: Secondary | ICD-10-CM

## 2016-06-20 HISTORY — DX: Vitamin D deficiency, unspecified: E55.9

## 2016-06-20 LAB — COMPREHENSIVE METABOLIC PANEL
A/G RATIO: 1.6 (ref 1.2–2.2)
ALT: 12 IU/L (ref 0–32)
AST: 13 IU/L (ref 0–40)
Albumin: 4.4 g/dL (ref 3.5–5.5)
Alkaline Phosphatase: 81 IU/L (ref 39–117)
BUN/Creatinine Ratio: 19 (ref 9–23)
BUN: 14 mg/dL (ref 6–20)
Bilirubin Total: <0.2 mg/dL (ref 0.0–1.2)
CALCIUM: 9.4 mg/dL (ref 8.7–10.2)
CO2: 27 mmol/L (ref 18–29)
CREATININE: 0.75 mg/dL (ref 0.57–1.00)
Chloride: 98 mmol/L (ref 96–106)
GFR, EST AFRICAN AMERICAN: 119 mL/min/{1.73_m2} (ref >59)
GFR, EST NON AFRICAN AMERICAN: 104 mL/min/{1.73_m2} (ref >59)
GLOBULIN, TOTAL: 2.8 g/dL (ref 1.5–4.5)
Glucose: 87 mg/dL (ref 65–99)
POTASSIUM: 4.2 mmol/L (ref 3.5–5.2)
Sodium: 141 mmol/L (ref 134–144)
TOTAL PROTEIN: 7.2 g/dL (ref 6.0–8.5)

## 2016-06-20 LAB — HEMOGLOBIN A1C
Est. average glucose Bld gHb Est-mCnc: 105 mg/dL
Hgb A1c MFr Bld: 5.3 % (ref 4.8–5.6)

## 2016-06-20 LAB — CBC
HEMATOCRIT: 38.7 % (ref 34.0–46.6)
Hemoglobin: 12.7 g/dL (ref 11.1–15.9)
MCH: 28.7 pg (ref 26.6–33.0)
MCHC: 32.8 g/dL (ref 31.5–35.7)
MCV: 88 fL (ref 79–97)
Platelets: 481 10*3/uL — ABNORMAL HIGH (ref 150–379)
RBC: 4.42 x10E6/uL (ref 3.77–5.28)
RDW: 14 % (ref 12.3–15.4)
WBC: 8.3 10*3/uL (ref 3.4–10.8)

## 2016-06-20 LAB — CYTOLOGY - PAP
Adequacy: ABSENT
Diagnosis: NEGATIVE
HPV: NOT DETECTED

## 2016-06-20 LAB — VITAMIN D 25 HYDROXY (VIT D DEFICIENCY, FRACTURES): VIT D 25 HYDROXY: 27.2 ng/mL — AB (ref 30.0–100.0)

## 2016-06-20 LAB — T4, FREE: Free T4: 1.3 ng/dL (ref 0.82–1.77)

## 2016-06-20 LAB — LIPID PANEL
CHOL/HDL RATIO: 4.6 ratio — AB (ref 0.0–4.4)
CHOLESTEROL TOTAL: 218 mg/dL — AB (ref 100–199)
HDL: 47 mg/dL (ref >39)
LDL CALC: 131 mg/dL — AB (ref 0–99)
TRIGLYCERIDES: 200 mg/dL — AB (ref 0–149)
VLDL CHOLESTEROL CAL: 40 mg/dL (ref 5–40)

## 2016-06-20 LAB — TSH: TSH: 0.27 u[IU]/mL — ABNORMAL LOW (ref 0.450–4.500)

## 2016-06-20 NOTE — Telephone Encounter (Signed)
Left message about labs, needs to watch carbs and take vitamin D3 5000 IU daily and if feeling Ok will leave synthroid at current dose, just let me know.Cholesterol elevated but I know was not fasting.

## 2016-06-21 ENCOUNTER — Telehealth: Payer: Self-pay | Admitting: Adult Health

## 2016-06-21 ENCOUNTER — Encounter: Payer: PRIVATE HEALTH INSURANCE | Admitting: Adult Health

## 2016-06-21 MED ORDER — LEVOTHYROXINE SODIUM 100 MCG PO TABS: 100.0000 ug | ORAL_TABLET | Freq: Every day | ORAL | 2 refills | Status: DC

## 2016-06-21 NOTE — Telephone Encounter (Signed)
Will decrease synthroid to 100 mcg and recheck labs in 3 months

## 2016-06-28 ENCOUNTER — Encounter (HOSPITAL_BASED_OUTPATIENT_CLINIC_OR_DEPARTMENT_OTHER): Payer: Self-pay | Admitting: *Deleted

## 2016-07-03 ENCOUNTER — Ambulatory Visit: Payer: Self-pay | Admitting: Plastic Surgery

## 2016-07-03 DIAGNOSIS — Z9013 Acquired absence of bilateral breasts and nipples: Secondary | ICD-10-CM

## 2016-07-03 DIAGNOSIS — N651 Disproportion of reconstructed breast: Secondary | ICD-10-CM

## 2016-07-05 ENCOUNTER — Ambulatory Visit (HOSPITAL_BASED_OUTPATIENT_CLINIC_OR_DEPARTMENT_OTHER): Payer: PRIVATE HEALTH INSURANCE | Admitting: Anesthesiology

## 2016-07-05 ENCOUNTER — Ambulatory Visit (HOSPITAL_BASED_OUTPATIENT_CLINIC_OR_DEPARTMENT_OTHER)
Admission: RE | Admit: 2016-07-05 | Discharge: 2016-07-05 | Disposition: A | Discharge status: Home / Self Care | Payer: PRIVATE HEALTH INSURANCE | Source: Ambulatory Visit | Attending: Plastic Surgery | Admitting: Plastic Surgery

## 2016-07-05 ENCOUNTER — Encounter (HOSPITAL_BASED_OUTPATIENT_CLINIC_OR_DEPARTMENT_OTHER)
Admission: RE | Disposition: A | Discharge status: Home / Self Care | Payer: Self-pay | Source: Ambulatory Visit | Attending: Plastic Surgery

## 2016-07-05 ENCOUNTER — Encounter (HOSPITAL_BASED_OUTPATIENT_CLINIC_OR_DEPARTMENT_OTHER): Payer: Self-pay | Admitting: *Deleted

## 2016-07-05 DIAGNOSIS — Z880 Allergy status to penicillin: Secondary | ICD-10-CM | POA: Insufficient documentation

## 2016-07-05 DIAGNOSIS — I10 Essential (primary) hypertension: Secondary | ICD-10-CM | POA: Diagnosis not present

## 2016-07-05 DIAGNOSIS — Z923 Personal history of irradiation: Secondary | ICD-10-CM | POA: Insufficient documentation

## 2016-07-05 DIAGNOSIS — N651 Disproportion of reconstructed breast: Secondary | ICD-10-CM

## 2016-07-05 DIAGNOSIS — Z888 Allergy status to other drugs, medicaments and biological substances status: Secondary | ICD-10-CM | POA: Insufficient documentation

## 2016-07-05 DIAGNOSIS — Z881 Allergy status to other antibiotic agents status: Secondary | ICD-10-CM | POA: Diagnosis not present

## 2016-07-05 DIAGNOSIS — Z9221 Personal history of antineoplastic chemotherapy: Secondary | ICD-10-CM | POA: Insufficient documentation

## 2016-07-05 DIAGNOSIS — Z8571 Personal history of Hodgkin lymphoma: Secondary | ICD-10-CM | POA: Insufficient documentation

## 2016-07-05 DIAGNOSIS — Z853 Personal history of malignant neoplasm of breast: Secondary | ICD-10-CM | POA: Insufficient documentation

## 2016-07-05 DIAGNOSIS — Z885 Allergy status to narcotic agent status: Secondary | ICD-10-CM | POA: Diagnosis not present

## 2016-07-05 DIAGNOSIS — Z9013 Acquired absence of bilateral breasts and nipples: Secondary | ICD-10-CM | POA: Diagnosis not present

## 2016-07-05 DIAGNOSIS — N6489 Other specified disorders of breast: Secondary | ICD-10-CM | POA: Insufficient documentation

## 2016-07-05 DIAGNOSIS — E039 Hypothyroidism, unspecified: Secondary | ICD-10-CM | POA: Diagnosis not present

## 2016-07-05 DIAGNOSIS — E559 Vitamin D deficiency, unspecified: Secondary | ICD-10-CM | POA: Diagnosis not present

## 2016-07-05 HISTORY — PX: LIPOSUCTION WITH LIPOFILLING: SHX6436

## 2016-07-05 SURGERY — LIPOSUCTION, WITH FAT TRANSFER
Anesthesia: General | Site: Breast | Laterality: Bilateral

## 2016-07-05 MED ORDER — CIPROFLOXACIN IN D5W 400 MG/200ML IV SOLN
400.0000 mg | INTRAVENOUS | Status: AC
  Administered 2016-07-05 (×2): 400 mg via INTRAVENOUS

## 2016-07-05 MED ORDER — EPINEPHRINE 30 MG/30ML IJ SOLN
INTRAMUSCULAR | Status: AC
  Filled 2016-07-05: qty 1

## 2016-07-05 MED ORDER — SCOPOLAMINE 1 MG/3DAYS TD PT72
MEDICATED_PATCH | TRANSDERMAL | Status: AC
  Filled 2016-07-05: qty 1

## 2016-07-05 MED ORDER — DEXAMETHASONE SODIUM PHOSPHATE 4 MG/ML IJ SOLN
INTRAMUSCULAR | Status: DC | PRN
  Administered 2016-07-05: 10 mg via INTRAVENOUS

## 2016-07-05 MED ORDER — MIDAZOLAM HCL 2 MG/2ML IJ SOLN
INTRAMUSCULAR | Status: AC
  Filled 2016-07-05: qty 2

## 2016-07-05 MED ORDER — PROPOFOL 10 MG/ML IV BOLUS
INTRAVENOUS | Status: DC | PRN
  Administered 2016-07-05: 200 mg via INTRAVENOUS

## 2016-07-05 MED ORDER — CIPROFLOXACIN IN D5W 400 MG/200ML IV SOLN
INTRAVENOUS | Status: AC
  Filled 2016-07-05: qty 200

## 2016-07-05 MED ORDER — ROCURONIUM BROMIDE 100 MG/10ML IV SOLN
INTRAVENOUS | Status: DC | PRN
  Administered 2016-07-05: 40 mg via INTRAVENOUS

## 2016-07-05 MED ORDER — FENTANYL CITRATE (PF) 100 MCG/2ML IJ SOLN
50.0000 ug | INTRAMUSCULAR | Status: AC | PRN
  Administered 2016-07-05: 100 ug via INTRAVENOUS
  Administered 2016-07-05 (×2): 50 ug via INTRAVENOUS

## 2016-07-05 MED ORDER — FENTANYL CITRATE (PF) 100 MCG/2ML IJ SOLN
25.0000 ug | INTRAMUSCULAR | Status: DC | PRN
  Administered 2016-07-05: 50 ug via INTRAVENOUS

## 2016-07-05 MED ORDER — LACTATED RINGERS IV SOLN
INTRAVENOUS | Status: DC
  Administered 2016-07-05 (×2): via INTRAVENOUS

## 2016-07-05 MED ORDER — PROPOFOL 500 MG/50ML IV EMUL
INTRAVENOUS | Status: AC
  Filled 2016-07-05: qty 50

## 2016-07-05 MED ORDER — LIDOCAINE HCL (CARDIAC) 20 MG/ML IV SOLN
INTRAVENOUS | Status: DC | PRN
  Administered 2016-07-05: 80 mg via INTRAVENOUS

## 2016-07-05 MED ORDER — SUGAMMADEX SODIUM 200 MG/2ML IV SOLN
INTRAVENOUS | Status: DC | PRN
  Administered 2016-07-05: 200 mg via INTRAVENOUS

## 2016-07-05 MED ORDER — MIDAZOLAM HCL 2 MG/2ML IJ SOLN
1.0000 mg | INTRAMUSCULAR | Status: DC | PRN
  Administered 2016-07-05: 2 mg via INTRAVENOUS

## 2016-07-05 MED ORDER — FENTANYL CITRATE (PF) 100 MCG/2ML IJ SOLN
INTRAMUSCULAR | Status: AC
  Filled 2016-07-05: qty 2

## 2016-07-05 MED ORDER — SODIUM CHLORIDE 0.9 % IJ SOLN
INTRAMUSCULAR | Status: AC
  Filled 2016-07-05: qty 40

## 2016-07-05 MED ORDER — LIDOCAINE HCL 1 % IJ SOLN
INTRAVENOUS | Status: DC | PRN
  Administered 2016-07-05: 1000 mL

## 2016-07-05 MED ORDER — SCOPOLAMINE 1 MG/3DAYS TD PT72
1.0000 | MEDICATED_PATCH | Freq: Once | TRANSDERMAL | Status: DC | PRN
  Administered 2016-07-05: 1.5 mg via TRANSDERMAL

## 2016-07-05 MED ORDER — SODIUM CHLORIDE 0.9 % IV SOLN
INTRAVENOUS | Status: DC | PRN
  Administered 2016-07-05: 80 ug via INTRAVENOUS

## 2016-07-05 MED ORDER — LIDOCAINE HCL (PF) 1 % IJ SOLN
INTRAMUSCULAR | Status: AC
  Filled 2016-07-05: qty 60

## 2016-07-05 MED ORDER — LIDOCAINE-EPINEPHRINE (PF) 1 %-1:200000 IJ SOLN
INTRAMUSCULAR | Status: AC
  Filled 2016-07-05: qty 30

## 2016-07-05 MED ORDER — LIDOCAINE-EPINEPHRINE (PF) 1.5 %-1:200000 IJ SOLN
INTRAMUSCULAR | Status: DC | PRN
  Administered 2016-07-05: 4 mL

## 2016-07-05 MED ORDER — ONDANSETRON HCL 4 MG/2ML IJ SOLN
INTRAMUSCULAR | Status: DC | PRN
  Administered 2016-07-05: 4 mg via INTRAVENOUS

## 2016-07-05 MED ORDER — ATROPINE SULFATE 0.4 MG/ML IJ SOLN
INTRAMUSCULAR | Status: AC
  Filled 2016-07-05: qty 1

## 2016-07-05 SURGICAL SUPPLY — 80 items
BAG DECANTER FOR FLEXI CONT (MISCELLANEOUS) ×4 IMPLANT
BINDER ABDOMINAL  9 SM 30-45 (SOFTGOODS)
BINDER ABDOMINAL 10 UNV 27-48 (MISCELLANEOUS) ×4 IMPLANT
BINDER ABDOMINAL 12 SM 30-45 (SOFTGOODS) IMPLANT
BINDER ABDOMINAL 9 SM 30-45 (SOFTGOODS) IMPLANT
BINDER BREAST LRG (GAUZE/BANDAGES/DRESSINGS) IMPLANT
BINDER BREAST MEDIUM (GAUZE/BANDAGES/DRESSINGS) IMPLANT
BINDER BREAST XLRG (GAUZE/BANDAGES/DRESSINGS) ×4 IMPLANT
BINDER BREAST XXLRG (GAUZE/BANDAGES/DRESSINGS) IMPLANT
BIOPATCH RED 1 DISK 7.0 (GAUZE/BANDAGES/DRESSINGS) IMPLANT
BIOPATCH RED 1IN DISK 7.0MM (GAUZE/BANDAGES/DRESSINGS)
BLADE HEX COATED 2.75 (ELECTRODE) ×4 IMPLANT
BLADE SURG 15 STRL LF DISP TIS (BLADE) ×2 IMPLANT
BLADE SURG 15 STRL SS (BLADE) ×2
BNDG GAUZE ELAST 4 BULKY (GAUZE/BANDAGES/DRESSINGS) IMPLANT
CANISTER SUCT 1200ML W/VALVE (MISCELLANEOUS) ×4 IMPLANT
CHLORAPREP W/TINT 26ML (MISCELLANEOUS) IMPLANT
COVER BACK TABLE 60X90IN (DRAPES) ×4 IMPLANT
COVER MAYO STAND STRL (DRAPES) ×4 IMPLANT
DECANTER SPIKE VIAL GLASS SM (MISCELLANEOUS) IMPLANT
DERMABOND ADVANCED (GAUZE/BANDAGES/DRESSINGS) ×2
DERMABOND ADVANCED .7 DNX12 (GAUZE/BANDAGES/DRESSINGS) ×2 IMPLANT
DRAIN CHANNEL 19F RND (DRAIN) IMPLANT
DRAPE LAPAROSCOPIC ABDOMINAL (DRAPES) ×4 IMPLANT
DRSG PAD ABDOMINAL 8X10 ST (GAUZE/BANDAGES/DRESSINGS) ×16 IMPLANT
ELECT BLADE 4.0 EZ CLEAN MEGAD (MISCELLANEOUS) ×4
ELECT BLADE 6.5 .24CM SHAFT (ELECTRODE) IMPLANT
ELECT REM PT RETURN 9FT ADLT (ELECTROSURGICAL) ×4
ELECTRODE BLDE 4.0 EZ CLN MEGD (MISCELLANEOUS) ×2 IMPLANT
ELECTRODE REM PT RTRN 9FT ADLT (ELECTROSURGICAL) ×2 IMPLANT
EVACUATOR SILICONE 100CC (DRAIN) IMPLANT
EXTRACTOR CANIST REVOLVE STRL (CANNISTER) ×4 IMPLANT
FILTER LIPOSUCTION (MISCELLANEOUS) ×4 IMPLANT
GLOVE BIO SURGEON STRL SZ 6.5 (GLOVE) ×6 IMPLANT
GLOVE BIO SURGEONS STRL SZ 6.5 (GLOVE) ×2
GLOVE BIOGEL PI IND STRL 7.5 (GLOVE) ×2 IMPLANT
GLOVE BIOGEL PI INDICATOR 7.5 (GLOVE) ×2
GLOVE SURG SS PI 7.5 STRL IVOR (GLOVE) ×4 IMPLANT
GOWN STRL REUS W/ TWL LRG LVL3 (GOWN DISPOSABLE) ×6 IMPLANT
GOWN STRL REUS W/TWL LRG LVL3 (GOWN DISPOSABLE) ×6
IV LACTATED RINGERS 1000ML (IV SOLUTION) ×12 IMPLANT
IV NS 500ML (IV SOLUTION)
IV NS 500ML BAXH (IV SOLUTION) IMPLANT
KIT FILL SYSTEM UNIVERSAL (SET/KITS/TRAYS/PACK) IMPLANT
LINER CANISTER 1000CC FLEX (MISCELLANEOUS) ×12 IMPLANT
NDL SAFETY ECLIPSE 18X1.5 (NEEDLE) ×2 IMPLANT
NEEDLE HYPO 18GX1.5 SHARP (NEEDLE) ×2
NEEDLE HYPO 25X1 1.5 SAFETY (NEEDLE) ×4 IMPLANT
NS IRRIG 1000ML POUR BTL (IV SOLUTION) IMPLANT
PACK BASIN DAY SURGERY FS (CUSTOM PROCEDURE TRAY) ×4 IMPLANT
PAD ALCOHOL SWAB (MISCELLANEOUS) ×4 IMPLANT
PENCIL BUTTON HOLSTER BLD 10FT (ELECTRODE) ×4 IMPLANT
PIN SAFETY STERILE (MISCELLANEOUS) IMPLANT
SLEEVE SCD COMPRESS KNEE MED (MISCELLANEOUS) ×4 IMPLANT
SPONGE GAUZE 4X4 12PLY STER LF (GAUZE/BANDAGES/DRESSINGS) IMPLANT
SPONGE LAP 18X18 X RAY DECT (DISPOSABLE) ×12 IMPLANT
SUT MNCRL AB 4-0 PS2 18 (SUTURE) IMPLANT
SUT MON AB 3-0 SH 27 (SUTURE) ×2
SUT MON AB 3-0 SH27 (SUTURE) ×2 IMPLANT
SUT MON AB 5-0 PS2 18 (SUTURE) ×4 IMPLANT
SUT PDS 3-0 CT2 (SUTURE)
SUT PDS AB 2-0 CT2 27 (SUTURE) IMPLANT
SUT PDS II 3-0 CT2 27 ABS (SUTURE) IMPLANT
SUT SILK 3 0 PS 1 (SUTURE) IMPLANT
SUT VIC AB 3-0 SH 27 (SUTURE)
SUT VIC AB 3-0 SH 27X BRD (SUTURE) IMPLANT
SUT VICRYL 4-0 PS2 18IN ABS (SUTURE) IMPLANT
SYR 10ML LL (SYRINGE) ×16 IMPLANT
SYR 3ML 18GX1 1/2 (SYRINGE) IMPLANT
SYR 50ML LL SCALE MARK (SYRINGE) ×4 IMPLANT
SYR BULB IRRIGATION 50ML (SYRINGE) ×4 IMPLANT
SYR CONTROL 10ML LL (SYRINGE) ×4 IMPLANT
SYR TOOMEY 50ML (SYRINGE) ×4 IMPLANT
TOWEL OR 17X24 6PK STRL BLUE (TOWEL DISPOSABLE) ×8 IMPLANT
TUBE CONNECTING 20'X1/4 (TUBING) ×1
TUBE CONNECTING 20X1/4 (TUBING) ×3 IMPLANT
TUBING INFILTRATION IT-10001 (TUBING) ×4 IMPLANT
TUBING SET GRADUATE ASPIR 12FT (MISCELLANEOUS) ×4 IMPLANT
UNDERPAD 30X30 (UNDERPADS AND DIAPERS) ×8 IMPLANT
YANKAUER SUCT BULB TIP NO VENT (SUCTIONS) ×4 IMPLANT

## 2016-07-05 NOTE — Addendum Note (Signed)
Addendum  created 07/05/16 0932 by Belinda Block, MD   Anesthesia Attestations filed

## 2016-07-05 NOTE — Anesthesia Postprocedure Evaluation (Signed)
Anesthesia Post Note  Patient: Bonnie Brown  Procedure(s) Performed: Procedure(s) (LRB): LIPOSUCTION WITH LIPOFILLING FROM ABDOMEN TO BILATERAL BREAST (Bilateral)  Patient location during evaluation: PACU Anesthesia Type: General Level of consciousness: awake Pain management: pain level controlled Vital Signs Assessment: post-procedure vital signs reviewed and stable Respiratory status: spontaneous breathing Cardiovascular status: stable Anesthetic complications: no       Last Vitals:  Vitals:   07/05/16 0629 07/05/16 0915  BP: 136/82 128/87  Pulse: 89 (!) 115  Resp: 18 12  Temp: 36.7 C 36.6 C    Last Pain:  Vitals:   07/05/16 0915  TempSrc:   PainSc: 0-No pain                 Caelynn Marshman

## 2016-07-05 NOTE — Anesthesia Postprocedure Evaluation (Signed)
Anesthesia Post Note  Patient: Bonnie Brown  Procedure(s) Performed: Procedure(s) (LRB): LIPOSUCTION WITH LIPOFILLING FROM ABDOMEN TO BILATERAL BREAST (Bilateral)  Patient location during evaluation: PACU Anesthesia Type: General Level of consciousness: awake Vital Signs Assessment: post-procedure vital signs reviewed and stable Respiratory status: spontaneous breathing Cardiovascular status: stable Anesthetic complications: no       Last Vitals:  Vitals:   07/05/16 0629 07/05/16 0915  BP: 136/82 128/87  Pulse: 89 (!) 115  Resp: 18 12  Temp: 36.7 C 36.6 C    Last Pain:  Vitals:   07/05/16 0915  TempSrc:   PainSc: 0-No pain                 Florentina Marquart

## 2016-07-05 NOTE — Anesthesia Postprocedure Evaluation (Signed)
Anesthesia Post Note  Patient: Bonnie Brown  Procedure(s) Performed: Procedure(s) (LRB): LIPOSUCTION WITH LIPOFILLING FROM ABDOMEN TO BILATERAL BREAST (Bilateral)  Anesthesia Post Evaluation     Last Vitals:  Vitals:   07/05/16 0629 07/05/16 0915  BP: 136/82 128/87  Pulse: 89 (!) 115  Resp: 18 12  Temp: 36.7 C 36.6 C    Last Pain:  Vitals:   07/05/16 0915  TempSrc:   PainSc: 0-No pain                 Elka Satterfield

## 2016-07-05 NOTE — Anesthesia Procedure Notes (Signed)
Procedure Name: Intubation Date/Time: 07/05/2016 7:47 AM Performed by: Melynda Ripple D Pre-anesthesia Checklist: Patient identified, Emergency Drugs available, Suction available and Patient being monitored Patient Re-evaluated:Patient Re-evaluated prior to inductionOxygen Delivery Method: Circle system utilized Preoxygenation: Pre-oxygenation with 100% oxygen Intubation Type: IV induction Ventilation: Mask ventilation without difficulty Tube type: Oral Number of attempts: 1 Airway Equipment and Method: Stylet and Oral airway Placement Confirmation: ETT inserted through vocal cords under direct vision,  positive ETCO2 and breath sounds checked- equal and bilateral Secured at: 22 cm Tube secured with: Tape Dental Injury: Teeth and Oropharynx as per pre-operative assessment

## 2016-07-05 NOTE — Anesthesia Preprocedure Evaluation (Signed)
Anesthesia Evaluation  Patient identified by MRN, date of birth, ID band Patient awake    Reviewed: Allergy & Precautions, NPO status , Patient's Chart, lab work & pertinent test results  Airway Mallampati: II  TM Distance: >3 FB     Dental   Pulmonary neg pulmonary ROS,    breath sounds clear to auscultation       Cardiovascular hypertension,  Rhythm:Regular Rate:Normal     Neuro/Psych  Headaches,    GI/Hepatic negative GI ROS, Neg liver ROS,   Endo/Other  Hypothyroidism   Renal/GU negative Renal ROS     Musculoskeletal   Abdominal   Peds  Hematology   Anesthesia Other Findings   Reproductive/Obstetrics                             Anesthesia Physical Anesthesia Plan  ASA: III  Anesthesia Plan: General   Post-op Pain Management:    Induction: Intravenous  Airway Management Planned: Oral ETT  Additional Equipment:   Intra-op Plan:   Post-operative Plan: Extubation in OR  Informed Consent: I have reviewed the patients History and Physical, chart, labs and discussed the procedure including the risks, benefits and alternatives for the proposed anesthesia with the patient or authorized representative who has indicated his/her understanding and acceptance.   Dental advisory given  Plan Discussed with: CRNA and Anesthesiologist  Anesthesia Plan Comments:         Anesthesia Quick Evaluation

## 2016-07-05 NOTE — Brief Op Note (Signed)
07/05/2016  9:04 AM  PATIENT:  Bonnie Brown  35 y.o. female  PRE-OPERATIVE DIAGNOSIS:  LEFT BREAST CANCER  POST-OPERATIVE DIAGNOSIS:  LEFT BREAST CANCER  PROCEDURE:  Procedure(s): BILATERAL BREAST RECONSTRUCTION REVISION; POSSIBLE EXCISION OF DOG EAR (Bilateral) LIPOSUCTION WITH LIPOFILLING (Bilateral)  SURGEON:  Surgeon(s) and Role:    * Loel Lofty Dillingham, DO - Primary  PHYSICIAN ASSISTANT: Shawn Rayburn, PA  ASSISTANTS: none   ANESTHESIA:   general  EBL:  Total I/O In: 1000 [I.V.:1000] Out: 10 [Blood:10]  BLOOD ADMINISTERED:none  DRAINS: none   LOCAL MEDICATIONS USED:  LIDOCAINE   SPECIMEN:  Source of Specimen:  left breast tissue  DISPOSITION OF SPECIMEN:  PATHOLOGY  COUNTS:  YES  TOURNIQUET:  * No tourniquets in log *  DICTATION: .Dragon Dictation  PLAN OF CARE: Discharge to home after PACU  PATIENT DISPOSITION:  PACU - hemodynamically stable.   Delay start of Pharmacological VTE agent (>24hrs) due to surgical blood loss or risk of bleeding: no

## 2016-07-05 NOTE — Op Note (Signed)
Op report Bilateral Exchange   DATE OF OPERATION: 07/05/2016  LOCATION: [place]  SURGICAL DIVISION: Plastic Surgery  PREOPERATIVE DIAGNOSES:  1. Bilateral Breast asymmetry due to reconstruction 2. History of left breast cancer.  3. Acquired absence of bilateral breast.   POSTOPERATIVE DIAGNOSES:  1. Bilateral Breast asymmetry due to reconstruction 2. History of left breast cancer.  3. Acquired absence of bilateral breast.   PROCEDURE:  1. Bilateral liposuction lateral axilla. 2. Bilateral breast lipofilling for symmetry.  SURGEON: Claire Sanger Dillingham, DO  ASSISTANT: Shawn Rayburn, PA  ANESTHESIA:  General.   COMPLICATIONS: None.   INDICATIONS FOR PROCEDURE:  The patient, Bonnie Brown, is a 35 y.o. female born on [birth year], is here for treatment after bilateral mastectomies for left breast cancer.  She had implants placed after expansion.  She has asymmetry now and is here for treatment. MRN: [id number]  CONSENT:  Informed consent was obtained directly from the patient. Risks, benefits and alternatives were fully discussed. Specific risks including but not limited to bleeding, infection, hematoma, seroma, scarring, pain, implant infection, implant extrusion, capsular contracture, asymmetry, wound healing problems, and need for further surgery were all discussed. The patient did have an ample opportunity to have her questions answered to her satisfaction.   DESCRIPTION OF PROCEDURE:  The patient was taken to the operating room. SCDs were placed and IV antibiotics were given. The patient's chest was prepped and draped in a sterile fashion. A time out was performed.    Tumescent was infused into the lateral breast areas on both sides and the abdominal area.  After waiting several minutes for the local to take effect liposuction was performed.  The Revolve adipose collection system was utilized.  The fat was prepared for implantation.  While  this was being done the lateral breast / axillary area was liposuctioned on both sides for improved contour.  This was done through a lateral 2 mm incision.  This was closed with 5-0 Monocryl.  The #15 blade was used to make an incision medially on each breast.  There was some fullness on the left medial breast and this was reduced with the liposuction cannula.  The lipofilling was done in the superior medial aspect of both breasts for 70 cc on each side.  The incisions were closed with 5-0 Monocryl.  Care was taken to not violate the pocket or implant. The abdominal incision was closed with 5-0 Monocryl.  Dermabond was applied to the incision site. A breast binder and ABDs were placed.  The patient was allowed to wake from anesthesia and taken to the recovery room in satisfactory condition.

## 2016-07-05 NOTE — H&P (Addendum)
Bonnie Brown is an 35 y.o. female.   Chief Complaint: breast asymmetry HPI: The patient is a 35 yrs old female seen in follow up after immediate bilateral breast reconstruction after mastectomy for left breast cancer. She felt a lesion on the left breast and went for a mammogram. She was radiated at the age of 9 yrs for hodgkin's disease (1992). After ultrasound and MRI she was diagnosed with atypical ductal hyperplasia with calcifications and lobular carcinoma in situ. She had significant ptosis of her breasts. She has two children and her husband's mother and sister have just been treated for breast cancer/BRCA positive.   She underwent left total mastectomy with left axillary sentinel lymph node biopsy and right prophylactic mastectomy followed by immediate bilateral reconstruction with tissue expanders and ADM on 12/12/15.   Past Medical History:  Diagnosis Date  . History of blood transfusion 1995  . History of breast cancer 08/2015  . History of chemotherapy age 89  . History of Hodgkin's lymphoma age 64  . Hypertension    under control with med., has been on med. since 04/2015  . Hypothyroidism   . Irregular heartbeat    states due to radiation treatments - no med., has not seen cardiology since 08/2013  . Migraines   . Vitamin D deficiency 06/20/2016   no current med.    Past Surgical History:  Procedure Laterality Date  . BREAST LUMPECTOMY WITH RADIOACTIVE SEED LOCALIZATION Left 09/06/2015   Procedure: BREAST LUMPECTOMY WITH RADIOACTIVE SEED LOCALIZATION;  Surgeon: Fanny Skates, MD;  Location: [facility];  Service: General;  Laterality: Left;  . BREAST RECONSTRUCTION WITH PLACEMENT OF TISSUE EXPANDER AND FLEX HD (ACELLULAR HYDRATED DERMIS) Bilateral 12/12/2015   Procedure: BILATERAL BREAST RECONSTRUCTION WITH PLACEMENT OF TISSUE EXPANDERS AND FLEX HD (ACELLULAR HYDRATED DERMIS);  Surgeon: Loel Lofty Dillingham, DO;  Location: [facility];  Service: Plastics;  Laterality:  Bilateral;  . CESAREAN SECTION N/A 11/04/2012   Procedure: Primary cesarean section with delivery of baby boy at 2007. apgars 8/9.;  Surgeon: Osborne Oman, MD;  Location: [facility];  Service: Obstetrics;  Laterality: N/A;  . CESAREAN SECTION N/A 07/30/2014   Procedure: REPEAT CESAREAN SECTION;  Surgeon: Jonnie Kind, MD;  Location: [facility];  Service: Obstetrics;  Laterality: N/A;  . LIPOSUCTION Left 03/29/2016   Procedure: LIPOSUCTION;  Surgeon: Wallace Going, DO;  Location: [facility];  Service: Plastics;  Laterality: Left;  . LYMPH NODE BIOPSY  1992  . MASTECTOMY W/ SENTINEL NODE BIOPSY Bilateral 12/12/2015   Procedure: LEFT TOTAL MASTECTOMY WITH LEFT AXILLARY SENTINEL LYMPH NODE BIOPSY AND RIGHT PROPHYLACTIC MASTECTOMY;  Surgeon: Fanny Skates, MD;  Location: [facility];  Service: General;  Laterality: Bilateral;  . REMOVAL OF BILATERAL TISSUE EXPANDERS WITH PLACEMENT OF BILATERAL BREAST IMPLANTS Bilateral 03/29/2016   Procedure: REMOVAL OF BILATERAL TISSUE EXPANDERS WITH PLACEMENT OF BILATERAL SILCONE BREAST IMPLANTS;  Surgeon: Wallace Going, DO;  Location: [facility];  Service: Plastics;  Laterality: Bilateral;  . TOTAL THYROIDECTOMY  05/16/2015  . WISDOM TOOTH EXTRACTION      Family History  Problem Relation Age of Onset  . Coronary artery disease Paternal Grandfather   . Diabetes Maternal Grandmother   . Stroke Maternal Grandmother   . Coronary artery disease Maternal Grandmother   . Pancreatic cancer Maternal Grandmother     dx in her 55s  . Coronary artery disease Father   . Hypertension Brother   . Hypertension Mother   .  Colon cancer Paternal Aunt     dx in her 1s-70s   Social History:  reports that she has never smoked. She has never used smokeless tobacco. She reports that she does not drink alcohol or use drugs.  Allergies:  Allergies  Allergen Reactions  . Seldane [Terfenadine] Other (See Comments)    INTERACTED WITH CHEMO DRUGS   . Vancomycin Itching, Rash and Other (See Comments)    FLUSHING OF SKIN   . Adhesive [Tape] Itching and Other (See Comments)    SKIN REDNESS  . Codeine Rash  . Penicillins Rash       . Peridex [Chlorhexidine Gluconate] Rash and Other (See Comments)    REDNESS OF SKIN    Medications Prior to Admission  Medication Sig Dispense Refill  . hydrochlorothiazide (MICROZIDE) 12.5 MG capsule Take 1 capsule (12.5 mg total) by mouth daily. 30 capsule 12  . levothyroxine (SYNTHROID, LEVOTHROID) 100 MCG tablet Take 1 tablet (100 mcg total) by mouth daily before breakfast. 30 tablet 2  . Prenatal Vit-Fe Fumarate-FA (PRENATAL VITAMIN PO) Take 2 tablets by mouth daily. Reported on 01/04/2016    . vitamin C (ASCORBIC ACID) 500 MG tablet Take 500 mg by mouth daily.      No results found for this or any previous visit (from the past 48 hour(s)). No results found.  Review of Systems  Constitutional: Negative.   HENT: Negative.   Eyes: Negative.   Respiratory: Negative.   Cardiovascular: Negative.   Gastrointestinal: Negative.   Genitourinary: Negative.   Musculoskeletal: Negative.   Skin: Negative.   Neurological: Negative.   Psychiatric/Behavioral: Negative.     Blood pressure 136/82, pulse 89, temperature 98.1 F (36.7 C), temperature source Oral, resp. rate 18, height '5\' 4"'  (1.626 m), weight 83.5 kg (184 lb), last menstrual period 07/01/2016, SpO2 100 %, not currently breastfeeding. Physical Exam  Constitutional: She is oriented to person, place, and time. She appears well-developed and well-nourished.  HENT:  Head: Normocephalic and atraumatic.  Eyes: EOM are normal. Pupils are equal, round, and reactive to light.  Respiratory: Effort normal. No respiratory distress.  GI: Soft. She exhibits no distension.  Neurological: She is alert and oriented to person, place, and time.  Skin: Skin is warm.  Psychiatric: She has a normal mood and affect. Her behavior is normal. Judgment and  thought content normal.     Assessment/Plan Plan for bilateral breast fat grafting for improved symmetry.  Wallace Going, DO 07/05/2016, 6:59 AM

## 2016-07-05 NOTE — Transfer of Care (Signed)
Immediate Anesthesia Transfer of Care Note  Patient: Bonnie Brown  Procedure(s) Performed: Procedure(s): BILATERAL BREAST RECONSTRUCTION REVISION (Bilateral) LIPOSUCTION WITH LIPOFILLING FROM ABDOMEN TO BILATERAL BREAST (Bilateral)  Patient Location: PACU  Anesthesia Type:General  Level of Consciousness: awake, alert , oriented and patient cooperative  Airway & Oxygen Therapy: Patient Spontanous Breathing and Patient connected to face mask oxygen  Post-op Assessment: Report given to RN and Post -op Vital signs reviewed and stable  Post vital signs: Reviewed and stable  Last Vitals:  Vitals:   07/05/16 0629  BP: 136/82  Pulse: 89  Resp: 18  Temp: 36.7 C    Last Pain:  Vitals:   07/05/16 0629  TempSrc: Oral         Complications: No apparent anesthesia complications

## 2016-07-05 NOTE — Anesthesia Postprocedure Evaluation (Signed)
Anesthesia Post Note  Patient: Bonnie Brown  Procedure(s) Performed: Procedure(s) (LRB): LIPOSUCTION WITH LIPOFILLING FROM ABDOMEN TO BILATERAL BREAST (Bilateral)  Anesthesia Post Evaluation     Last Vitals:  Vitals:   07/05/16 0629 07/05/16 0915  BP: 136/82 128/87  Pulse: 89 (!) 115  Resp: 18 12  Temp: 36.7 C 36.6 C    Last Pain:  Vitals:   07/05/16 0915  TempSrc:   PainSc: 0-No pain                 Emoni Whitworth

## 2016-07-05 NOTE — Discharge Instructions (Signed)
May shower Saturday No heavy lifting Continue binders or sports bra with spanx  Post Anesthesia Home Care Instructions  Activity: Get plenty of rest for the remainder of the day. A responsible adult should stay with you for 24 hours following the procedure.  For the next 24 hours, DO NOT: -Drive a car -Paediatric nurse -Drink alcoholic beverages -Take any medication unless instructed by your physician -Make any legal decisions or sign important papers.  Meals: Start with liquid foods such as gelatin or soup. Progress to regular foods as tolerated. Avoid greasy, spicy, heavy foods. If nausea and/or vomiting occur, drink only clear liquids until the nausea and/or vomiting subsides. Call your physician if vomiting continues.  Special Instructions/Symptoms: Your throat may feel dry or sore from the anesthesia or the breathing tube placed in your throat during surgery. If this causes discomfort, gargle with warm salt water. The discomfort should disappear within 24 hours.  If you had a scopolamine patch placed behind your ear for the management of post- operative nausea and/or vomiting:  1. The medication in the patch is effective for 72 hours, after which it should be removed.  Wrap patch in a tissue and discard in the trash. Wash hands thoroughly with soap and water. 2. You may remove the patch earlier than 72 hours if you experience unpleasant side effects which may include dry mouth, dizziness or visual disturbances. 3. Avoid touching the patch. Wash your hands with soap and water after contact with the patch.

## 2016-07-06 ENCOUNTER — Encounter (HOSPITAL_BASED_OUTPATIENT_CLINIC_OR_DEPARTMENT_OTHER): Payer: Self-pay | Admitting: Plastic Surgery

## 2016-09-04 ENCOUNTER — Ambulatory Visit: Payer: PRIVATE HEALTH INSURANCE | Admitting: Cardiovascular Disease

## 2016-10-02 ENCOUNTER — Encounter: Payer: Self-pay | Admitting: *Deleted

## 2016-10-02 ENCOUNTER — Telehealth: Payer: Self-pay | Admitting: *Deleted

## 2016-10-02 ENCOUNTER — Ambulatory Visit (INDEPENDENT_AMBULATORY_CARE_PROVIDER_SITE_OTHER): Payer: PRIVATE HEALTH INSURANCE | Admitting: Cardiovascular Disease

## 2016-10-02 ENCOUNTER — Telehealth: Payer: Self-pay | Admitting: Adult Health

## 2016-10-02 ENCOUNTER — Encounter: Payer: Self-pay | Admitting: Cardiovascular Disease

## 2016-10-02 ENCOUNTER — Telehealth: Payer: Self-pay | Admitting: Cardiovascular Disease

## 2016-10-02 VITALS — BP 137/86 | HR 91 | Ht 64.0 in | Wt 177.0 lb

## 2016-10-02 DIAGNOSIS — R0609 Other forms of dyspnea: Secondary | ICD-10-CM

## 2016-10-02 DIAGNOSIS — I519 Heart disease, unspecified: Secondary | ICD-10-CM | POA: Diagnosis not present

## 2016-10-02 DIAGNOSIS — I10 Essential (primary) hypertension: Secondary | ICD-10-CM | POA: Diagnosis not present

## 2016-10-02 DIAGNOSIS — R072 Precordial pain: Secondary | ICD-10-CM

## 2016-10-02 DIAGNOSIS — E039 Hypothyroidism, unspecified: Secondary | ICD-10-CM

## 2016-10-02 NOTE — Progress Notes (Signed)
CARDIOLOGY CONSULT NOTE  Patient ID: NEELIE WELSHANS MRN: [id number] DOB/AGE: [birth year] 36 y.o.  Admit date: (Not on file) Primary Physician: Arsenio Katz, NP Referring Physician: PCP Cyndi Bender)  Reason for Consultation: history of diastolic dysfunction  HPI: The patient is a 36 year old woman with a history of left-sided breast cancer and Hodgkin's lymphoma who received both chemotherapy (Cytoxan 3890 mg/m2, Adriamycin 97 mg/m2, dacarbazine  1455 mg/m2, bleomycin 35 mg/m2, procarbazine 10771 mg/m2) and radiation (3000 cGy mantle radiation, 469 cGy mediastinal radiation boost and 3000 cGy para-aortic node and spleen fields).   She reportedly has a history of diastolic dysfunction for which she has been referred by her PCP. I personally reviewed old records and studies including labs and cardiac imaging. Upon review of records, she was last seen by a cardiologist on 08/28/13, Dr. Norman Clay at [facility].   Prior cardiac imaging:  A. (06/16/2010) 2D echocardiogram with Doppler: LVID 3.7 cm, normal  left ventricular size and function, ejection fraction of 60-64%, mitral  inflow pattern would predict normal left atrial pressure with associated  diastolic abnormalities (E/E-prime 40/8), PA systolic pressure is normal.   B. (08/01/2010) Cardiac MRI: LVEDV 95 cc, LVSV 55 cc, LVEF 57%, normal  cardiac structure and function, no evidence for delayed enhancement. T2  imaging without myocardial edema. No significant valvular or pericardial  abnormalities.   C. (08/01/2010) Exercise metabolic test: Patient achieved stage 4, 100  watt work load, peak HR 173 BPM, peak BP 160/84, RPP 262. Termination due  to muscle fatigue and dyspnea. MVO2 16 mL/kg/min, RER 1.28, VE/VCO2 29.  Functional capacity (VO2 max/P) 62%. No evidence for arrhythmia or ischemia.  Mild to moderately reduced cardiopulmonary limitation to exercise capacity  noted.  She also underwent thyroidectomy in November 2015.  She underwent a double mastectomy with breast reconstruction at the age of 79. She was hormone receptor positive on the left and negative on the right.  She has begun to exercise again within the past 2 months and has noticed exertional retrosternal chest pain and shortness of breath after 15-20 minutes of activity. This usually occurs after during high intensity interval training. There is no radiation to the back, jaw, or arms. There is no associated nausea or vomiting. It is associated lightheadedness but she denies syncope. Her graft she has also been experiencing palpitations at rest for the past few months. They have not been associated with dizziness.  She occasionally has mild hand and ankle swelling. She denies orthopnea and paroxysmal nocturnal dyspnea.  She rates the chest tightness as 7 out of 10.   ECG performed in the office today which I personally reviewed demonstrates normal sinus rhythm with no ischemic ST segment or T-wave abnormalities, nor any arrhythmias.   Soc: Works at [employer].        Allergies  Allergen Reactions  . Seldane [Terfenadine] Other (See Comments)    INTERACTED WITH CHEMO DRUGS  . Vancomycin Itching, Rash and Other (See Comments)    FLUSHING OF SKIN   . Adhesive [Tape] Itching and Other (See Comments)    SKIN REDNESS  . Codeine Rash  . Penicillins Rash       . Peridex [Chlorhexidine Gluconate] Rash and Other (See Comments)    REDNESS OF SKIN    Current Outpatient Prescriptions  Medication Sig Dispense Refill  . Cholecalciferol 4000 units CAPS Take 4,000 Units by mouth daily.    . hydrochlorothiazide (MICROZIDE) 12.5 MG capsule Take 1 capsule (  12.5 mg total) by mouth daily. 30 capsule 12  . levofloxacin (LEVAQUIN) 500 MG tablet Take 500 mg by mouth daily.    Marland Kitchen levothyroxine (SYNTHROID, LEVOTHROID) 100 MCG tablet Take 1 tablet (100 mcg total) by mouth daily before breakfast. 30 tablet 2  . Prenatal Vit-Fe  Fumarate-FA (PRENATAL VITAMIN PO) Take 2 tablets by mouth daily. Reported on 01/04/2016    . vitamin C (ASCORBIC ACID) 500 MG tablet Take 500 mg by mouth daily.     No current facility-administered medications for this visit.     Past Medical History:  Diagnosis Date  . History of blood transfusion 1995  . History of breast cancer 08/2015  . History of chemotherapy age 52  . History of Hodgkin's lymphoma age 38  . Hypertension    under control with med., has been on med. since 04/2015  . Hypothyroidism   . Irregular heartbeat    states due to radiation treatments - no med., has not seen cardiology since 08/2013  . Migraines   . Vitamin D deficiency 06/20/2016   no current med.    Past Surgical History:  Procedure Laterality Date  . BREAST LUMPECTOMY WITH RADIOACTIVE SEED LOCALIZATION Left 09/06/2015   Procedure: BREAST LUMPECTOMY WITH RADIOACTIVE SEED LOCALIZATION;  Surgeon: Fanny Skates, MD;  Location: [facility];  Service: General;  Laterality: Left;  . BREAST RECONSTRUCTION WITH PLACEMENT OF TISSUE EXPANDER AND FLEX HD (ACELLULAR HYDRATED DERMIS) Bilateral 12/12/2015   Procedure: BILATERAL BREAST RECONSTRUCTION WITH PLACEMENT OF TISSUE EXPANDERS AND FLEX HD (ACELLULAR HYDRATED DERMIS);  Surgeon: Loel Lofty Dillingham, DO;  Location: [facility];  Service: Plastics;  Laterality: Bilateral;  . CESAREAN SECTION N/A 11/04/2012   Procedure: Primary cesarean section with delivery of baby boy at 2007. apgars 8/9.;  Surgeon: Osborne Oman, MD;  Location: [facility];  Service: Obstetrics;  Laterality: N/A;  . CESAREAN SECTION N/A 07/30/2014   Procedure: REPEAT CESAREAN SECTION;  Surgeon: Jonnie Kind, MD;  Location: [facility];  Service: Obstetrics;  Laterality: N/A;  . LIPOSUCTION Left 03/29/2016   Procedure: LIPOSUCTION;  Surgeon: Wallace Going, DO;  Location: [facility];  Service: Plastics;  Laterality: Left;  . LIPOSUCTION WITH LIPOFILLING Bilateral 07/05/2016    Procedure: LIPOSUCTION WITH LIPOFILLING FROM ABDOMEN TO BILATERAL BREAST;  Surgeon: Wallace Going, DO;  Location: [facility];  Service: Plastics;  Laterality: Bilateral;  . LYMPH NODE BIOPSY  1992  . MASTECTOMY W/ SENTINEL NODE BIOPSY Bilateral 12/12/2015   Procedure: LEFT TOTAL MASTECTOMY WITH LEFT AXILLARY SENTINEL LYMPH NODE BIOPSY AND RIGHT PROPHYLACTIC MASTECTOMY;  Surgeon: Fanny Skates, MD;  Location: [facility];  Service: General;  Laterality: Bilateral;  . REMOVAL OF BILATERAL TISSUE EXPANDERS WITH PLACEMENT OF BILATERAL BREAST IMPLANTS Bilateral 03/29/2016   Procedure: REMOVAL OF BILATERAL TISSUE EXPANDERS WITH PLACEMENT OF BILATERAL SILCONE BREAST IMPLANTS;  Surgeon: Wallace Going, DO;  Location: [facility];  Service: Plastics;  Laterality: Bilateral;  . TOTAL THYROIDECTOMY  05/16/2015  . WISDOM TOOTH EXTRACTION      Social History   Social History  . Marital status: Married    Spouse name: N/A  . Number of children: N/A  . Years of education: N/A   Occupational History  . Not on file.   Social History Main Topics  . Smoking status: Never Smoker  . Smokeless tobacco: Never Used  . Alcohol use No  . Drug use: No  . Sexual activity: Yes    Birth control/  protection: None, Condom   Other Topics Concern  . Not on file   Social History Narrative  . No narrative on file     No family history of premature CAD in 1st degree relatives.  Prior to Admission medications   Medication Sig Start Date End Date Taking? Authorizing Provider  hydrochlorothiazide (MICROZIDE) 12.5 MG capsule Take 1 capsule (12.5 mg total) by mouth daily. 06/19/16   Estill Dooms, NP  levothyroxine (SYNTHROID, LEVOTHROID) 100 MCG tablet Take 1 tablet (100 mcg total) by mouth daily before breakfast. 06/21/16   Estill Dooms, NP  Prenatal Vit-Fe Fumarate-FA (PRENATAL VITAMIN PO) Take 2 tablets by mouth daily. Reported on 01/04/2016    Historical Provider, MD    vitamin C (ASCORBIC ACID) 500 MG tablet Take 500 mg by mouth daily.    Historical Provider, MD     Review of systems complete and found to be negative unless listed above in HPI     Physical exam Blood pressure 137/86, pulse 91, height 5\' 4"  (1.626 m), weight 177 lb (80.3 kg), SpO2 98 %. General: NAD Neck: No JVD, no thyromegaly or thyroid nodule.  Lungs: Clear to auscultation bilaterally with normal respiratory effort. CV: Nondisplaced PMI. Regular rate and rhythm, normal S1/S2, no S3/S4, no murmur.  No peripheral edema.  No carotid bruit.  Normal pedal pulses.  Abdomen: Soft, nontender, no hepatosplenomegaly, no distention.  Skin: Intact without lesions or rashes.  Neurologic: Alert and oriented x 3.  Psych: Normal affect. Extremities: No clubbing or cyanosis.  HEENT: Normal.   ECG: Most recent ECG reviewed.  Telemetry: Independently reviewed.  Labs:   Lab Results  Component Value Date   WBC 8.3 06/19/2016   HGB 12.7 12/12/2015   HCT 38.7 06/19/2016   MCV 88 06/19/2016   PLT 481 (H) 06/19/2016   No results for input(s): NA, K, CL, CO2, BUN, CREATININE, CALCIUM, PROT, BILITOT, ALKPHOS, ALT, AST, GLUCOSE in the last 168 hours.  Invalid input(s): LABALBU No results found for: CKTOTAL, CKMB, CKMBINDEX, TROPONINI  Lab Results  Component Value Date   CHOL 218 (H) 06/19/2016   Lab Results  Component Value Date   HDL 47 06/19/2016   Lab Results  Component Value Date   LDLCALC 131 (H) 06/19/2016   Lab Results  Component Value Date   TRIG 200 (H) 06/19/2016   Lab Results  Component Value Date   CHOLHDL 4.6 (H) 06/19/2016   No results found for: LDLDIRECT       Studies: No results found.  ASSESSMENT AND PLAN:  1. Exertional chest tightness and shortness of breath: Given the fact that she underwent radiation therapy for a forementioned malignancies, she is at a higher risk of coronary artery disease. I will proceed with stress echocardiography.   2.  Hypertension: Controlled. No changes.  3. Diastolic dysfunction: This appears to have been documented in the past and has been deemed secondary to chemotherapy. I will order a 2-D echocardiogram with Doppler to evaluate cardiac structure, function, and regional wall motion.  Dispo: fu 6 weeks.   Signed: Kate Sable, M.D., F.A.C.C.  10/02/2016, 8:55 AM

## 2016-10-02 NOTE — Patient Instructions (Signed)
Medication Instructions:  Continue all current medications.  Labwork: none  Testing/Procedures:  Your physician has requested that you have a stress echocardiogram. For further information please visit [email or web address]. Please follow instruction sheet as given.  Office will contact with results via phone or letter.    Follow-Up: 6 weeks   Any Other Special Instructions Will Be Listed Below (If Applicable).  If you need a refill on your cardiac medications before your next appointment, please call your pharmacy.

## 2016-10-02 NOTE — Telephone Encounter (Signed)
Left message that labs done at [facility] not [facility], I can put TSH in for 4//4

## 2016-10-02 NOTE — Telephone Encounter (Signed)
Pt called to let us know that she is going for labs on Wednesday at [place] message to [place] (per pt request).  10-02-16  AS

## 2016-10-02 NOTE — Telephone Encounter (Signed)
STRESS ECHO scheduled at [facility] April 4th 2018

## 2016-10-10 ENCOUNTER — Ambulatory Visit (HOSPITAL_COMMUNITY)
Admission: RE | Admit: 2016-10-10 | Discharge: 2016-10-10 | Disposition: A | Discharge status: Home / Self Care | Payer: Commercial Managed Care - PPO | Source: Ambulatory Visit | Attending: Cardiovascular Disease | Admitting: Cardiovascular Disease

## 2016-10-10 DIAGNOSIS — R072 Precordial pain: Secondary | ICD-10-CM | POA: Diagnosis not present

## 2016-10-10 DIAGNOSIS — R0609 Other forms of dyspnea: Secondary | ICD-10-CM | POA: Diagnosis not present

## 2016-10-10 DIAGNOSIS — R079 Chest pain, unspecified: Secondary | ICD-10-CM | POA: Diagnosis present

## 2016-10-10 LAB — ECHOCARDIOGRAM STRESS TEST
CHL CUP RESTING HR STRESS: 99 {beats}/min
CSEPED: 10 min
CSEPPHR: 196 {beats}/min
Estimated workload: 13.4 METS
Exercise duration (sec): 23 s
MPHR: 185 {beats}/min
Percent HR: 105 %
RPE: 11

## 2016-10-10 NOTE — Progress Notes (Signed)
*  PRELIMINARY RESULTS* Echocardiogram 2D Echocardiogram has been performed.  Bonnie Brown 10/10/2016, 9:39 AM

## 2016-10-11 ENCOUNTER — Telehealth: Payer: Self-pay | Admitting: Adult Health

## 2016-10-11 LAB — TSH: TSH: 0.308 u[IU]/mL — AB (ref 0.450–4.500)

## 2016-10-11 MED ORDER — LEVOTHYROXINE SODIUM 100 MCG PO TABS: 100.0000 ug | ORAL_TABLET | Freq: Every day | ORAL | 6 refills | Status: DC

## 2016-10-11 NOTE — Telephone Encounter (Signed)
Left message that TSH 0.308, will refill meds at current dose and recheck in 3-6 months.

## 2016-10-12 ENCOUNTER — Telehealth: Payer: Self-pay | Admitting: *Deleted

## 2016-10-12 NOTE — Telephone Encounter (Signed)
Notes recorded by Laurine Blazer, LPN on 10/14/2498 at 3:70 PM EDT Left message to return call.  ------  Notes recorded by Herminio Commons, MD on 10/10/2016 at 3:45 PM EDT Normal.

## 2016-10-12 NOTE — Telephone Encounter (Signed)
Notes recorded by Laurine Blazer, LPN on 08/17/5282 at 1:32 PM EDT Patient notified and verbalized understanding. Copy to pmd. Follow up scheduled for 11/16/2016. ------

## 2016-10-12 NOTE — Telephone Encounter (Signed)
If before 4:30 call [phone] Extension [phone] If after 4:30 call cell phone

## 2016-11-16 ENCOUNTER — Encounter: Payer: Self-pay | Admitting: Cardiovascular Disease

## 2016-11-16 ENCOUNTER — Ambulatory Visit (INDEPENDENT_AMBULATORY_CARE_PROVIDER_SITE_OTHER): Payer: Commercial Managed Care - PPO | Admitting: Cardiovascular Disease

## 2016-11-16 VITALS — BP 122/82 | HR 78 | Ht 64.0 in | Wt 174.0 lb

## 2016-11-16 DIAGNOSIS — R0609 Other forms of dyspnea: Secondary | ICD-10-CM | POA: Diagnosis not present

## 2016-11-16 DIAGNOSIS — I519 Heart disease, unspecified: Secondary | ICD-10-CM

## 2016-11-16 DIAGNOSIS — R072 Precordial pain: Secondary | ICD-10-CM | POA: Diagnosis not present

## 2016-11-16 DIAGNOSIS — I10 Essential (primary) hypertension: Secondary | ICD-10-CM | POA: Diagnosis not present

## 2016-11-16 DIAGNOSIS — IMO0001 Reserved for inherently not codable concepts without codable children: Secondary | ICD-10-CM

## 2016-11-16 DIAGNOSIS — Z136 Encounter for screening for cardiovascular disorders: Secondary | ICD-10-CM

## 2016-11-16 NOTE — Progress Notes (Signed)
SUBJECTIVE: The patient returns for follow-up after undergoing cardiovascular testing performed for the evaluation of chest pain.  Stress echocardiography was normal.  She is back to exercising 4 days per week. She has lost 18 pounds by her home scale. Her blood pressure this morning is normal and she has yet to take her antihypertensive medication.   Review of Systems: As per "subjective", otherwise negative.  Allergies  Allergen Reactions  . Seldane [Terfenadine] Other (See Comments)    INTERACTED WITH CHEMO DRUGS  . Vancomycin Itching, Rash and Other (See Comments)    FLUSHING OF SKIN   . Adhesive [Tape] Itching and Other (See Comments)    SKIN REDNESS  . Codeine Rash  . Penicillins Rash       . Peridex [Chlorhexidine Gluconate] Rash and Other (See Comments)    REDNESS OF SKIN    Current Outpatient Prescriptions  Medication Sig Dispense Refill  . Cholecalciferol 4000 units CAPS Take 4,000 Units by mouth daily.    . hydrochlorothiazide (MICROZIDE) 12.5 MG capsule Take 1 capsule (12.5 mg total) by mouth daily. 30 capsule 12  . levothyroxine (SYNTHROID, LEVOTHROID) 100 MCG tablet Take 1 tablet (100 mcg total) by mouth daily before breakfast. 30 tablet 6  . Prenatal Vit-Fe Fumarate-FA (PRENATAL VITAMIN PO) Take 2 tablets by mouth daily. Reported on 01/04/2016    . vitamin C (ASCORBIC ACID) 500 MG tablet Take 500 mg by mouth daily.     No current facility-administered medications for this visit.     Past Medical History:  Diagnosis Date  . History of blood transfusion 1995  . History of breast cancer 08/2015  . History of chemotherapy age 51  . History of Hodgkin's lymphoma age 46  . Hypertension    under control with med., has been on med. since 04/2015  . Hypothyroidism   . Irregular heartbeat    states due to radiation treatments - no med., has not seen cardiology since 08/2013  . Migraines   . Vitamin D deficiency 06/20/2016   no current med.    Past  Surgical History:  Procedure Laterality Date  . BREAST LUMPECTOMY WITH RADIOACTIVE SEED LOCALIZATION Left 09/06/2015   Procedure: BREAST LUMPECTOMY WITH RADIOACTIVE SEED LOCALIZATION;  Surgeon: Fanny Skates, MD;  Location: [facility];  Service: General;  Laterality: Left;  . BREAST RECONSTRUCTION WITH PLACEMENT OF TISSUE EXPANDER AND FLEX HD (ACELLULAR HYDRATED DERMIS) Bilateral 12/12/2015   Procedure: BILATERAL BREAST RECONSTRUCTION WITH PLACEMENT OF TISSUE EXPANDERS AND FLEX HD (ACELLULAR HYDRATED DERMIS);  Surgeon: Loel Lofty Dillingham, DO;  Location: [facility];  Service: Plastics;  Laterality: Bilateral;  . CESAREAN SECTION N/A 11/04/2012   Procedure: Primary cesarean section with delivery of baby boy at 2007. apgars 8/9.;  Surgeon: Osborne Oman, MD;  Location: [facility];  Service: Obstetrics;  Laterality: N/A;  . CESAREAN SECTION N/A 07/30/2014   Procedure: REPEAT CESAREAN SECTION;  Surgeon: Jonnie Kind, MD;  Location: [facility];  Service: Obstetrics;  Laterality: N/A;  . LIPOSUCTION Left 03/29/2016   Procedure: LIPOSUCTION;  Surgeon: Wallace Going, DO;  Location: [facility];  Service: Plastics;  Laterality: Left;  . LIPOSUCTION WITH LIPOFILLING Bilateral 07/05/2016   Procedure: LIPOSUCTION WITH LIPOFILLING FROM ABDOMEN TO BILATERAL BREAST;  Surgeon: Wallace Going, DO;  Location: [facility];  Service: Plastics;  Laterality: Bilateral;  . LYMPH NODE BIOPSY  1992  . MASTECTOMY W/ SENTINEL NODE BIOPSY Bilateral 12/12/2015   Procedure: LEFT  TOTAL MASTECTOMY WITH LEFT AXILLARY SENTINEL LYMPH NODE BIOPSY AND RIGHT PROPHYLACTIC MASTECTOMY;  Surgeon: Fanny Skates, MD;  Location: [place];  Service: General;  Laterality: Bilateral;  . REMOVAL OF BILATERAL TISSUE EXPANDERS WITH PLACEMENT OF BILATERAL BREAST IMPLANTS Bilateral 03/29/2016   Procedure: REMOVAL OF BILATERAL TISSUE EXPANDERS WITH PLACEMENT OF BILATERAL SILCONE BREAST IMPLANTS;  Surgeon: Wallace Going, DO;  Location: [facility];  Service: Plastics;  Laterality: Bilateral;  . TOTAL THYROIDECTOMY  05/16/2015  . WISDOM TOOTH EXTRACTION      Social History   Social History  . Marital status: Married    Spouse name: N/A  . Number of children: N/A  . Years of education: N/A   Occupational History  . Not on file.   Social History Main Topics  . Smoking status: Never Smoker  . Smokeless tobacco: Never Used  . Alcohol use No  . Drug use: No  . Sexual activity: Yes    Birth control/ protection: None, Condom   Other Topics Concern  . Not on file   Social History Narrative  . No narrative on file     Vitals:   11/16/16 0829  BP: 122/82  Pulse: 78  SpO2: 98%  Weight: 174 lb (78.9 kg)  Height: 5\' 4"  (1.626 m)    Wt Readings from Last 3 Encounters:  11/16/16 174 lb (78.9 kg)  10/02/16 177 lb (80.3 kg)  07/05/16 184 lb (83.5 kg)     PHYSICAL EXAM General: NAD HEENT: Normal. Neck: No JVD, no thyromegaly. Lungs: Clear to auscultation bilaterally with normal respiratory effort. CV: Nondisplaced PMI.  Regular rate and rhythm, normal S1/S2, no S3/S4, no murmur. No pretibial or periankle edema.    Abdomen: Soft, nontender, no distention.  Neurologic: Alert and oriented.  Psych: Normal affect. Skin: Normal. Musculoskeletal: No gross deformities.    ECG: Most recent ECG reviewed.   Labs: Lab Results  Component Value Date/Time   K 4.2 06/19/2016 10:33 AM   BUN 14 06/19/2016 10:33 AM   CREATININE 0.75 06/19/2016 10:33 AM   CREATININE 0.50 04/26/2014 04:38 PM   ALT 12 06/19/2016 10:33 AM   TSH 0.308 (L) 10/10/2016 09:39 AM   HGB 12.7 12/12/2015 01:13 PM   HGB 10.4 08/13/2012     Lipids: Lab Results  Component Value Date/Time   LDLCALC 131 (H) 06/19/2016 10:33 AM   CHOL 218 (H) 06/19/2016 10:33 AM   TRIG 200 (H) 06/19/2016 10:33 AM   HDL 47 06/19/2016 10:33 AM       ASSESSMENT AND PLAN: 1. Exertional chest tightness and  shortness of breath: Symptoms have already improved and she is on a regular exercise regimen. Stress echocardiography was normal. No further cardiac testing is indicated at this time.  2. Hypertension: Controlled without taking her antihypertensive medication this morning. I encouraged her in her exercise routine as this has the potential to also naturally decrease blood pressure.  3. Diastolic dysfunction: Cardiac function appears to be intact.    Disposition: Follow up prn  Kate Sable, M.D., F.A.C.C.

## 2016-11-16 NOTE — Patient Instructions (Signed)
Your physician recommends that you schedule a follow-up appointment in: AS NEEDED WITH DR KONESWARAN  Your physician recommends that you continue on your current medications as directed. Please refer to the Current Medication list given to you today.  Thank you for choosing [facility]!!    

## 2017-01-22 IMAGING — MG MM DIAG BREAST TOMO BILATERAL
8 of 9 series · 8 of 9 positions shown · non-contrast
Comparison: None.

CLINICAL DATA: The patient notes an area of possible palpable
nodularity within the upper inner quadrant of the left breast.

EXAM:
DIGITAL DIAGNOSTIC BILATERAL MAMMOGRAM WITH 3D TOMOSYNTHESIS WITH
CAD
ULTRASOUND LEFT BREAST

[L TAN (1 of 2)]
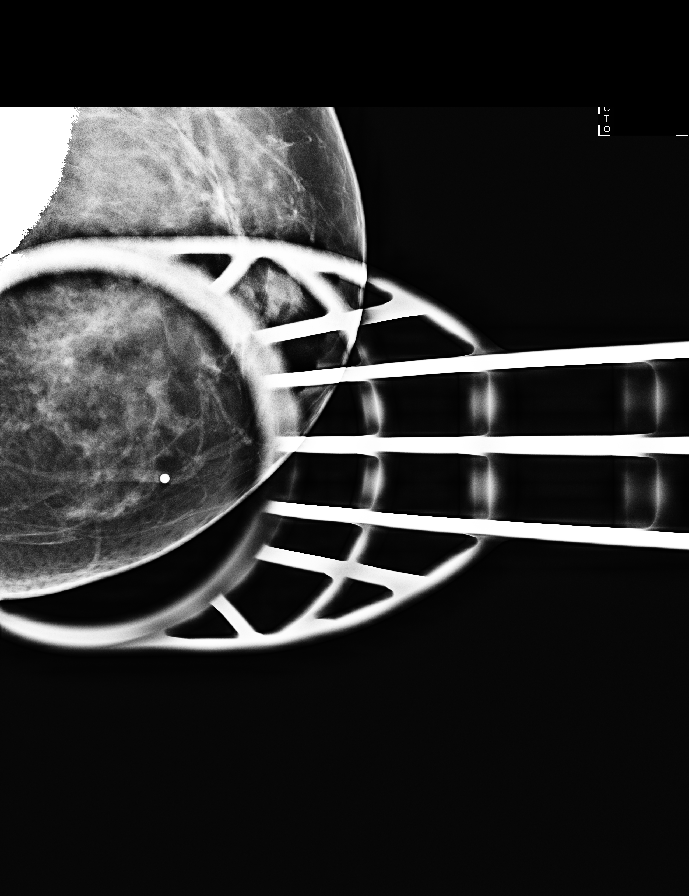

[L ML (1 of 2)]
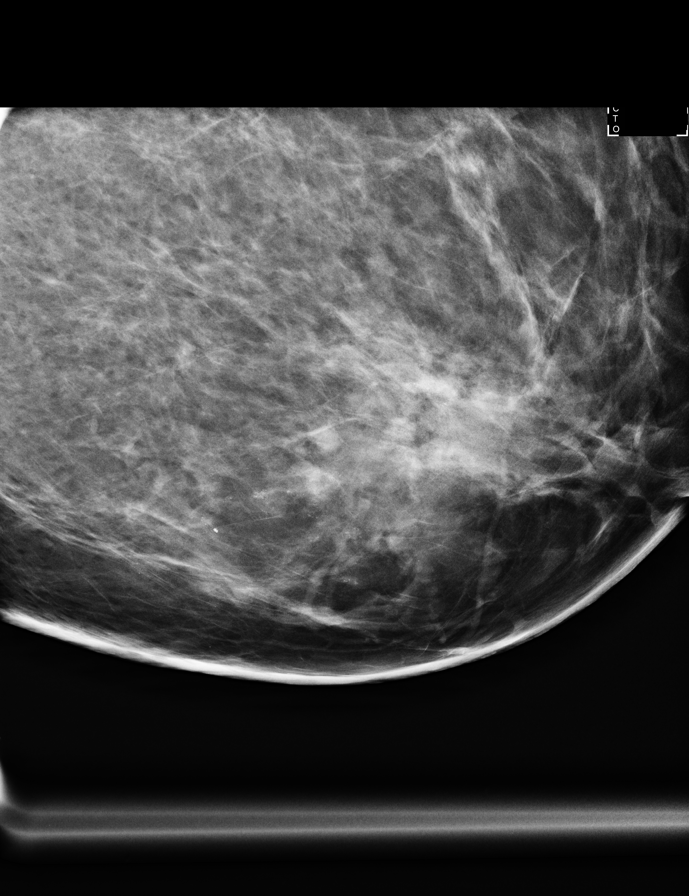

[L CC (1 of 2)]
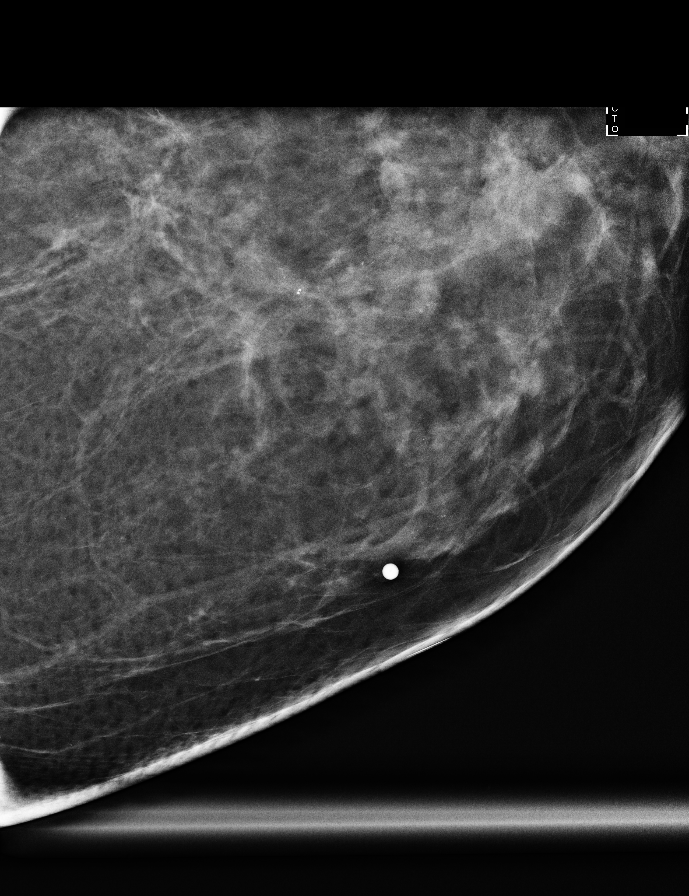

[L ML (2 of 2)]
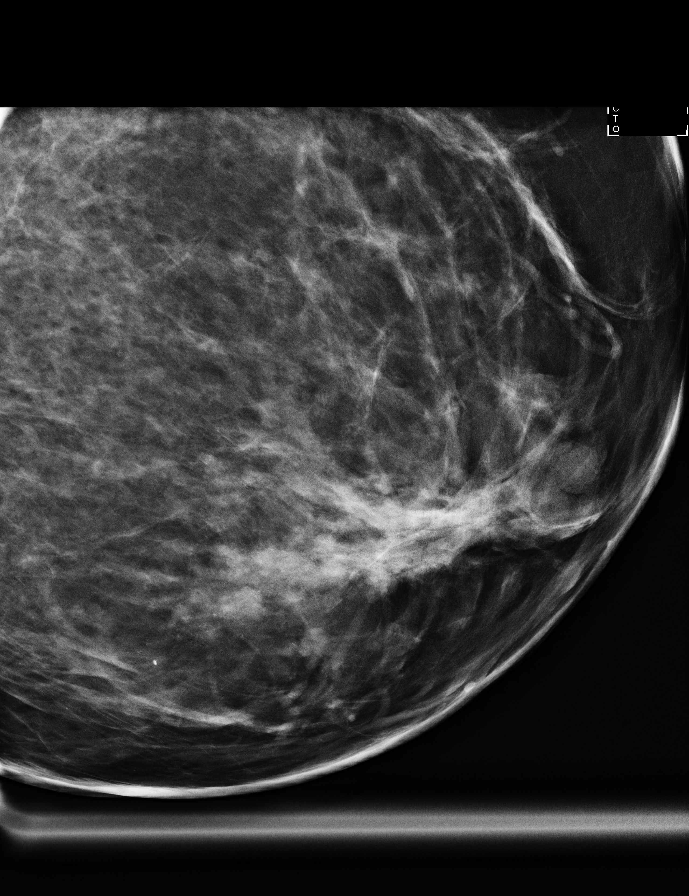

[L TAN (2 of 2)]
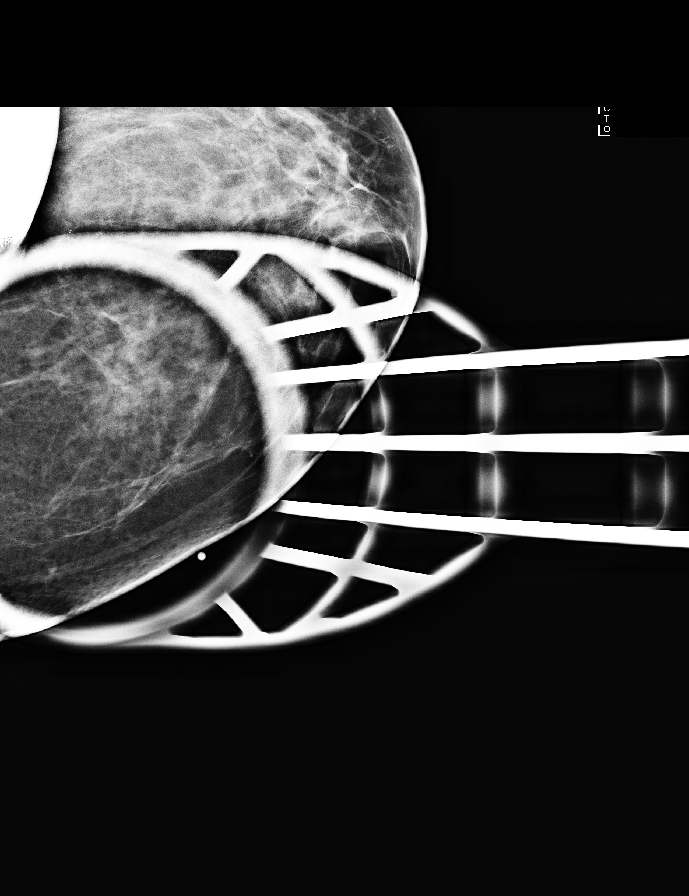

[L MLO]
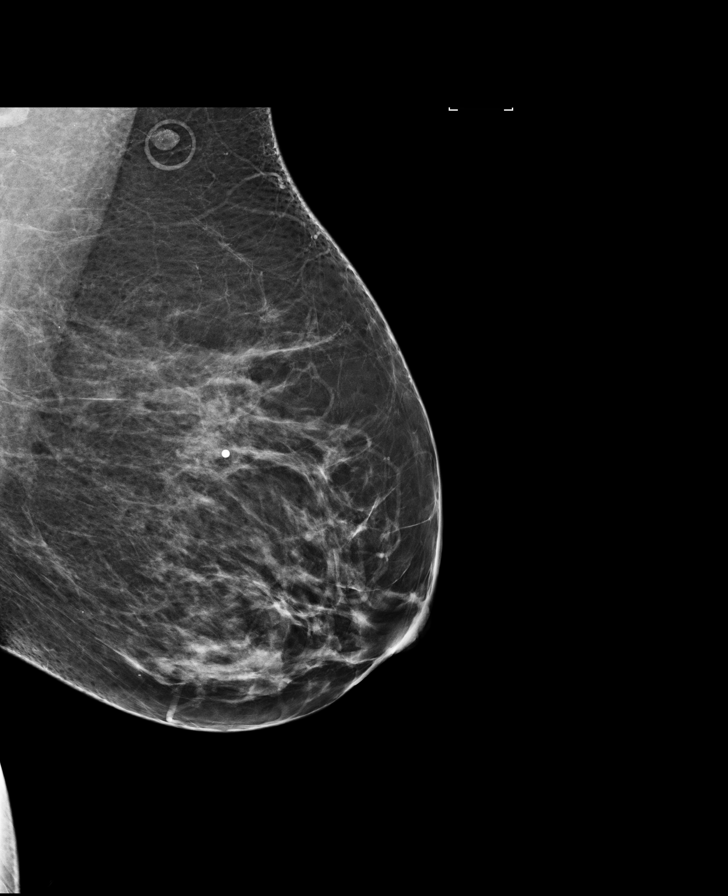

[L CC (2 of 2)]
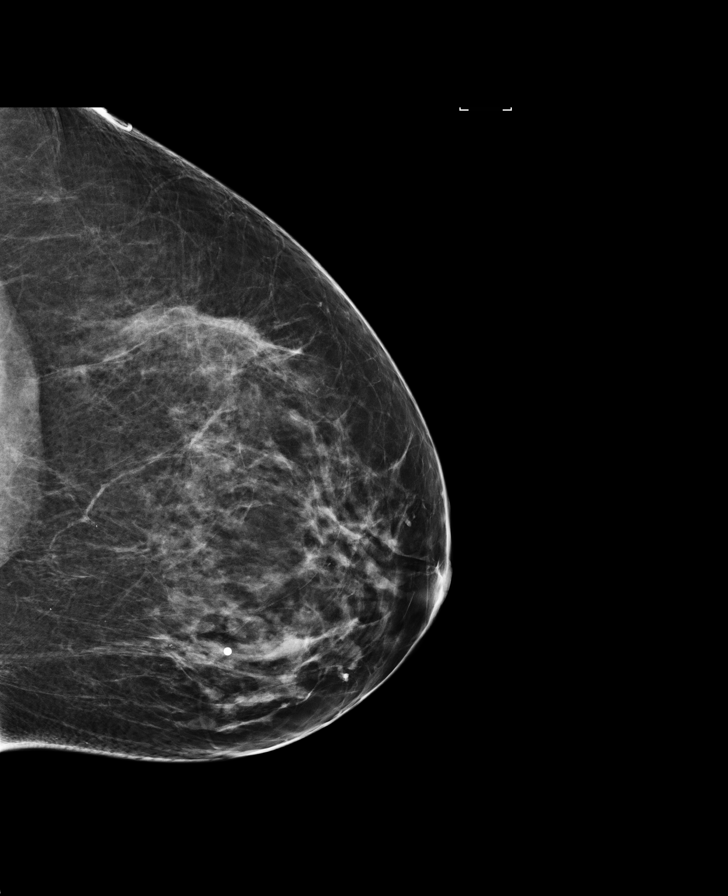

[R MLO]
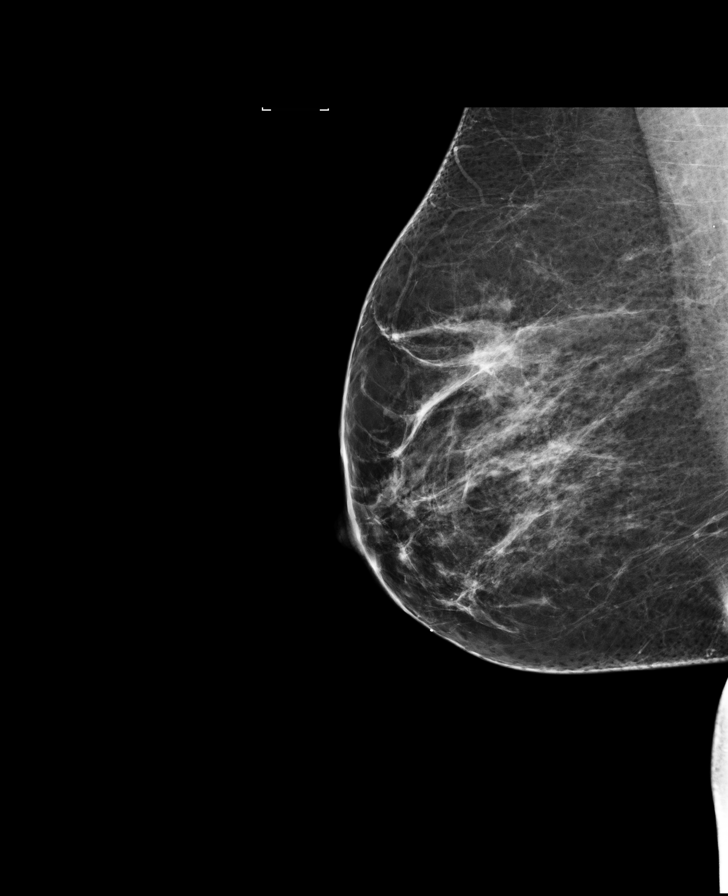

[8 of 9 positions shown; findings below may reference images not displayed]

This is the patient's baseline examination.

ACR Breast Density Category b: There are scattered areas of
fibroglandular density.
FINDINGS: There is no mass or distortion. In the area of questionable palpable
nodularity noted by the patient there is normal appearing
fibroglandular tissue.

There are grouped faint segmental heterogeneous calcifications
located within the lower inner quadrant of the left breast. These
span approximately 5.5 cm. These are suspicious for possible DCIS
and tissue sampling via stereotactic core biopsy is recommended. I
have discussed this with the patient. This has been scheduled at the
[REDACTED] on 08/16/2015. There are no additional
findings.

Mammographic images were processed with CAD.

On physical exam, there is no discrete palpable abnormality in the
area of questionable concern noted by the patient within the upper
inner quadrant of the left breast.

Targeted ultrasound is performed, showing normal appearing
fibroglandular tissue in this region.
IMPRESSION: Segmental heterogeneous calcifications located within the lower
inner quadrant of the left breast. These are suspicious for possible
DCIS and tissue sampling is recommended. Stereotactic core biopsy
will be scheduled.

RECOMMENDATION:
Left breast stereotactic core biopsy. This is scheduled at the
[REDACTED] on 08/16/2015 at 9 a.m..

I have discussed the findings and recommendations with the patient.
Results were also provided in writing at the conclusion of the
visit. If applicable, a reminder letter will be sent to the patient
regarding the next appointment.

BI-RADS CATEGORY  4: Suspicious.

## 2017-02-05 ENCOUNTER — Telehealth: Payer: Self-pay | Admitting: *Deleted

## 2017-02-05 ENCOUNTER — Other Ambulatory Visit: Payer: Self-pay | Admitting: *Deleted

## 2017-02-05 DIAGNOSIS — R0789 Other chest pain: Secondary | ICD-10-CM

## 2017-02-05 DIAGNOSIS — C50312 Malignant neoplasm of lower-inner quadrant of left female breast: Secondary | ICD-10-CM

## 2017-02-05 DIAGNOSIS — Z170 Estrogen receptor positive status [ER+]: Principal | ICD-10-CM

## 2017-02-05 NOTE — Telephone Encounter (Signed)
Received call from Dr. Eusebio Friendly office concerning some complaints of right chest wall pain.  MRI scheduled for 8/2 and she will see Dr. Lindi Adie 8/2.  Requested stat read on MRI.  Patient aware of appointments.

## 2017-02-06 ENCOUNTER — Ambulatory Visit: Payer: Commercial Managed Care - PPO | Admitting: Hematology and Oncology

## 2017-02-07 ENCOUNTER — Ambulatory Visit (HOSPITAL_BASED_OUTPATIENT_CLINIC_OR_DEPARTMENT_OTHER): Payer: Commercial Managed Care - PPO | Admitting: Hematology and Oncology

## 2017-02-07 ENCOUNTER — Encounter: Payer: Self-pay | Admitting: Hematology and Oncology

## 2017-02-07 ENCOUNTER — Ambulatory Visit
Admission: RE | Admit: 2017-02-07 | Discharge: 2017-02-07 | Disposition: A | Discharge status: Home / Self Care | Payer: Commercial Managed Care - PPO | Source: Ambulatory Visit | Attending: Hematology and Oncology | Admitting: Hematology and Oncology

## 2017-02-07 DIAGNOSIS — Z170 Estrogen receptor positive status [ER+]: Secondary | ICD-10-CM | POA: Diagnosis not present

## 2017-02-07 DIAGNOSIS — R0789 Other chest pain: Secondary | ICD-10-CM

## 2017-02-07 DIAGNOSIS — D0512 Intraductal carcinoma in situ of left breast: Secondary | ICD-10-CM

## 2017-02-07 DIAGNOSIS — C50312 Malignant neoplasm of lower-inner quadrant of left female breast: Secondary | ICD-10-CM

## 2017-02-07 DIAGNOSIS — Z8571 Personal history of Hodgkin lymphoma: Secondary | ICD-10-CM

## 2017-02-07 MED ORDER — GADOBENATE DIMEGLUMINE 529 MG/ML IV SOLN
17.0000 mL | Freq: Once | INTRAVENOUS | Status: AC | PRN
  Administered 2017-02-07: 17 mL via INTRAVENOUS

## 2017-02-07 NOTE — Progress Notes (Signed)
Patient Care Team: Arsenio Katz, NP as PCP - General (Nurse Practitioner)  DIAGNOSIS:  Encounter Diagnosis  Name Primary?  . 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      12/12/2015 Surgery    Bilateral mastectomies: Right mastectomy: Fibroadenoma with fibrocystic changes no malignancy; left mastectomy: Fibrocystic changes no malignancy, 0/2 left sentinel nodes       CHIEF COMPLIANT:pain in the right breast  INTERVAL HISTORY: Bonnie Brown is a 36 year old with above-mentioned history of bilateral mastectomies for DCIS. She had bilateral reconstruction of the breasts. She has been working out very aggressively and lost about 25 pounds. She was at the beach when she noticed pain in the right breast which stops at the upper inner quadrant and extends laterally. She saw Dr. Marla Roe will referred the patient to me. She had a breast MRI done earlier today and is here to discuss the results.  REVIEW OF SYSTEMS:   Constitutional: Denies fevers, chills or abnormal weight loss Eyes: Denies blurriness of vision Ears, nose, mouth, throat, and face:  Denies mucositis or sore throat Respiratory: Denies cough, dyspnea or wheezes Cardiovascular: Denies palpitation, chest discomfort Gastrointestinal:  Denies nausea, heartburn or change in bowel habits Skin: Denies abnormal skin rashes Lymphatics: Denies new lymphadenopathy or easy bruising Neurological:Denies numbness, tingling or new weaknesses Behavioral/Psych: Mood is stable, no new changes  Extremities: No lower extremity edema Breast:pain in the right reconstructed breast All other systems were reviewed with the patient and are negative.  I have reviewed the past medical history, past surgical history, social history and family history with the patient and they are unchanged from previous note.  ALLERGIES:  is allergic to seldane [terfenadine]; vancomycin; adhesive [tape]; codeine; penicillins; and peridex [chlorhexidine gluconate].  MEDICATIONS:  Current Outpatient Prescriptions  Medication Sig Dispense Refill  . Cholecalciferol 4000 units CAPS Take 4,000 Units by mouth daily.    Marland Kitchen levothyroxine (SYNTHROID, LEVOTHROID) 100 MCG tablet Take 1 tablet (100 mcg total) by mouth daily before breakfast. 30 tablet 6  . vitamin C (ASCORBIC ACID) 500 MG tablet Take 500 mg by mouth daily.     No current facility-administered medications for this visit.     PHYSICAL EXAMINATION: ECOG PERFORMANCE STATUS: 1 - Symptomatic but completely ambulatory  Vitals:   02/07/17 1451  BP: 135/76  Pulse: 75  Resp: 16  Temp: 97.9 F (36.6 C)   Filed Weights   02/07/17 1451  Weight: 162 lb 4.8 oz (73.6 kg)    GENERAL:alert, no distress and comfortable SKIN: skin color, texture, turgor are normal, no rashes or significant lesions EYES: normal, Conjunctiva are pink and  non-injected, sclera clear OROPHARYNX:no exudate, no erythema and lips, buccal mucosa, and tongue normal  NECK: supple, thyroid normal size, non-tender, without nodularity LYMPH:  no palpable lymphadenopathy in the cervical,  axillary or inguinal LUNGS: clear to auscultation and percussion with normal breathing effort HEART: regular rate & rhythm and no murmurs and no lower extremity edema ABDOMEN:abdomen soft, non-tender and normal bowel sounds MUSCULOSKELETAL:no cyanosis of digits and no clubbing  NEURO: alert & oriented x 3 with fluent speech, no focal motor/sensory deficits EXTREMITIES: No lower extremity edema  LABORATORY DATA:  I have reviewed the data as listed   Chemistry      Component Value Date/Time   NA 141 06/19/2016 1033   K 4.2 06/19/2016 1033   CL 98 06/19/2016 1033   CO2 27 06/19/2016 1033   BUN 14 06/19/2016 1033   CREATININE 0.75 06/19/2016 1033   CREATININE 0.50 04/26/2014 1638      Component Value Date/Time   CALCIUM 9.4 06/19/2016 1033   ALKPHOS 81 06/19/2016 1033   AST 13 06/19/2016 1033   ALT 12 06/19/2016 1033   BILITOT <0.2 06/19/2016 1033       Lab Results  Component Value Date   WBC 8.3 06/19/2016   HGB 12.7 06/19/2016   HCT 38.7 06/19/2016   MCV 88 06/19/2016   PLT 481 (H) 06/19/2016   NEUTROABS 5.7 12/06/2015    ASSESSMENT & PLAN:  Breast cancer of lower-inner quadrant of left female breast ([place]) Left lumpectomy 09/06/2015: Papillary DCIS 2 cm in size, ER 95%, PR 70% with LCIS, DCIS focally 0.1 cm from inferior margin, LCIS focally at lateral margin, Tis N0 stage 0 Prior history of Hodgkin's lymphoma with spleen involvement at age 58 status post chemotherapy followed by mantle radiation  Bilateral mastectomies 12/12/2015: Right mastectomy: Fibroadenoma with fibrocystic changes no malignancy; left mastectomy: Fibrocystic changes no malignancy, 0/2 left sentinel nodes Bilateral breast reconstruction  Surveillance: 1. Breast exam 8 2018: Benign 2. No role of mammogram since she had bilateral mastectomies  Survivorship: Patient has been very active and exercising and has lost 25 pounds. She wakes up early in the morning and goes to the gym.  MRI of the  breasts 02/07/2017:  Done to evaluate the cause of the breast pain. No evidence of malignancy either breasts. Return to clinic in February for survivorship care plan visit.  I spent 25 minutes talking to the patient of which more than half was spent in counseling and coordination of care.  No orders of the defined types were placed in this encounter.  The patient has a good understanding of the overall plan. she agrees with it. she will call with any problems that may develop before the next visit here.   Rulon Eisenmenger, MD 02/07/17

## 2017-02-07 NOTE — Assessment & Plan Note (Signed)
Left lumpectomy 09/06/2015: Papillary DCIS 2 cm in size, ER 95%, PR 70% with LCIS, DCIS focally 0.1 cm from inferior margin, LCIS focally at lateral margin, Tis N0 stage 0 Prior history of Hodgkin's lymphoma with spleen involvement at age 36 status post chemotherapy followed by mantle radiation  Bilateral mastectomies 12/12/2015: Right mastectomy: Fibroadenoma with fibrocystic changes no malignancy; left mastectomy: Fibrocystic changes no malignancy, 0/2 left sentinel nodes Bilateral breast reconstruction  Surveillance: 1. Breast exam 8 2018: Benign 2. No role of mammogram since she had bilateral mastectomies  Return to clinic in 1 year for follow-up

## 2017-02-21 ENCOUNTER — Telehealth: Payer: Self-pay | Admitting: Adult Health

## 2017-02-21 DIAGNOSIS — E039 Hypothyroidism, unspecified: Secondary | ICD-10-CM

## 2017-02-21 NOTE — Telephone Encounter (Signed)
Feeling tired wants thyroid labs checked, will check TSH and free T4

## 2017-02-23 LAB — T4, FREE: Free T4: 1.21 ng/dL (ref 0.82–1.77)

## 2017-02-23 LAB — TSH: TSH: 5.06 u[IU]/mL — ABNORMAL HIGH (ref 0.450–4.500)

## 2017-02-25 ENCOUNTER — Telehealth: Payer: Self-pay | Admitting: Adult Health

## 2017-02-25 MED ORDER — LEVOTHYROXINE SODIUM 125 MCG PO TABS: 125.0000 ug | ORAL_TABLET | Freq: Every day | ORAL | 2 refills | Status: DC

## 2017-02-25 NOTE — Telephone Encounter (Signed)
Pt aware TSH elevated 5.060, will increase levothyroxine to 125 mcg, she said she has not changed anything. Recheck labs in 3 months, placed in recall

## 2017-05-13 ENCOUNTER — Telehealth: Payer: Self-pay | Admitting: *Deleted

## 2017-05-13 DIAGNOSIS — E039 Hypothyroidism, unspecified: Secondary | ICD-10-CM

## 2017-05-13 NOTE — Telephone Encounter (Signed)
Left message that labs in

## 2017-05-29 ENCOUNTER — Other Ambulatory Visit: Payer: Self-pay | Admitting: Adult Health

## 2017-05-30 LAB — T4, FREE: FREE T4: 1.34 ng/dL (ref 0.82–1.77)

## 2017-05-30 LAB — TSH: TSH: 0.804 u[IU]/mL (ref 0.450–4.500)

## 2017-06-21 ENCOUNTER — Ambulatory Visit (INDEPENDENT_AMBULATORY_CARE_PROVIDER_SITE_OTHER): Payer: Commercial Managed Care - PPO | Admitting: Adult Health

## 2017-06-21 ENCOUNTER — Other Ambulatory Visit: Payer: Self-pay

## 2017-06-21 ENCOUNTER — Encounter: Payer: Self-pay | Admitting: Adult Health

## 2017-06-21 VITALS — BP 126/78 | HR 91 | Ht 64.0 in | Wt 178.0 lb

## 2017-06-21 DIAGNOSIS — Z8571 Personal history of Hodgkin lymphoma: Secondary | ICD-10-CM | POA: Diagnosis not present

## 2017-06-21 DIAGNOSIS — E039 Hypothyroidism, unspecified: Secondary | ICD-10-CM

## 2017-06-21 DIAGNOSIS — Z853 Personal history of malignant neoplasm of breast: Secondary | ICD-10-CM

## 2017-06-21 DIAGNOSIS — Z01419 Encounter for gynecological examination (general) (routine) without abnormal findings: Secondary | ICD-10-CM | POA: Diagnosis not present

## 2017-06-21 MED ORDER — LEVOTHYROXINE SODIUM 125 MCG PO TABS: ORAL_TABLET | ORAL | 11 refills | Status: DC

## 2017-06-21 NOTE — Progress Notes (Signed)
Patient ID: Bonnie Brown, female   DOB: [birth year], 36 y.o.   MRN: [id number] History of Present Illness: Bonnie Brown is a 36 year old white female, married in for a well woman gyn exam, she had a normal pap with negative HPV 06/19/16.She is still working at [employer] in [place].  PCP is Arsenio Katz NP in [place].   Current Medications, Allergies, Past Medical History, Past Surgical History, Family History and Social History were reviewed in [employer] record.     Review of Systems: Patient denies any headaches, hearing loss, fatigue, blurred vision, shortness of breath, chest pain, abdominal pain, problems with bowel movements, urination, or intercourse. No joint pain or mood swings.Periods heavy about 3 days,she is currently not using birth control.     Physical Exam:BP 126/78 (BP Location: Right Arm, Patient Position: Sitting, Cuff Size: Normal)   Pulse 91   Ht 5\' 4"  (1.626 m)   Wt 178 lb (80.7 kg)   LMP 06/11/2017   BMI 30.55 kg/m  General:  Well developed, well nourished, no acute distress Skin:  Warm and dry Neck:  Midline trachea, thyroid absent, good ROM, no lymphadenopathy Lungs; Clear to auscultation bilaterally Breast: sp bilateral mastectomy, with bilateral implants, no masses felt Cardiovascular: Regular rate and rhythm Abdomen:  Soft, non tender, no hepatosplenomegaly Pelvic:  External genitalia is normal in appearance, no lesions.  The vagina is normal in appearance. Urethra has no lesions or masses. The cervix is smooth..  Uterus is felt to be normal size, shape, and contour.  No adnexal masses or tenderness noted.Bladder is non tender, no masses felt. Extremities/musculoskeletal:  No swelling or varicosities noted, no clubbing or cyanosis Psych:  No mood changes, alert and cooperative,seems happy PHQ 2 score 0. Discussed could do ablation to stop bleeding, when decides no more kids.   Impression:  1. Encounter for well woman exam with  routine gynecological exam   2. Hypothyroidism, unspecified type   3. History of breast cancer   4. History of Hodgkin's disease      Plan: Physical in 1 year Pap in 2020 Meds ordered this encounter  Medications  . levothyroxine (SYNTHROID, LEVOTHROID) 125 MCG tablet    Sig: TAKE ONE TABLET BY MOUTH DAILY BEFORE BREAKFAST    Dispense:  30 tablet    Refill:  11    This prescription was filled on 05/09/2017. Any refills authorized will be placed on file.    Order Specific Question:   Supervising Provider    Answer:   Florian Buff [[id number]]  Labs next year  Review handout on endometrial ablation

## 2017-08-13 ENCOUNTER — Ambulatory Visit: Payer: Commercial Managed Care - PPO | Admitting: Hematology and Oncology

## 2017-11-21 ENCOUNTER — Other Ambulatory Visit: Payer: Self-pay | Admitting: *Deleted

## 2017-11-21 ENCOUNTER — Telehealth: Payer: Self-pay | Admitting: Hematology

## 2017-11-21 ENCOUNTER — Inpatient Hospital Stay: Payer: Commercial Managed Care - PPO | Attending: Hematology and Oncology | Admitting: Hematology and Oncology

## 2017-11-21 DIAGNOSIS — C50312 Malignant neoplasm of lower-inner quadrant of left female breast: Secondary | ICD-10-CM

## 2017-11-21 DIAGNOSIS — Z170 Estrogen receptor positive status [ER+]: Secondary | ICD-10-CM | POA: Diagnosis not present

## 2017-11-21 DIAGNOSIS — D0512 Intraductal carcinoma in situ of left breast: Secondary | ICD-10-CM | POA: Diagnosis present

## 2017-11-21 DIAGNOSIS — Z8571 Personal history of Hodgkin lymphoma: Secondary | ICD-10-CM | POA: Diagnosis not present

## 2017-11-21 DIAGNOSIS — N63 Unspecified lump in unspecified breast: Secondary | ICD-10-CM | POA: Diagnosis not present

## 2017-11-21 NOTE — Telephone Encounter (Signed)
Gave patient avs report and appointments for May 2020. Patient given appointment for breast/us at [facility] 11/28/2017. Biopsy to be scheduled at later date based on [place] results.

## 2017-11-21 NOTE — Assessment & Plan Note (Signed)
Left lumpectomy 09/06/2015: Papillary DCIS 2 cm in size, ER 95%, PR 70% with LCIS, DCIS focally 0.1 cm from inferior margin, LCIS focally at lateral margin, Tis N0 stage 0 Prior history of Hodgkin's lymphoma with spleen involvement at age 37 status post chemotherapy followed by mantle radiation  Bilateral mastectomies 12/12/2015: Right mastectomy: Fibroadenoma with fibrocystic changes no malignancy; left mastectomy: Fibrocystic changes no malignancy, 0/2 left sentinel nodes Bilateral breast reconstruction  Surveillance: 1. Breast exam 11/21/2017: Benign 2. No role of mammogram since she had bilateral mastectomies  Survivorship: Patient has been very active and exercising and has lost 25 pounds. She wakes up early in the morning and goes to the gym.  MRI of the breasts 02/07/2017:  Done to evaluate the cause of the breast pain. No evidence of malignancy either breasts. Return to clinic in February for survivorship care plan visit.  Return to clinic in 1 year for long-term survivorship clinic

## 2017-11-21 NOTE — Progress Notes (Signed)
Patient Care Team: Arsenio Katz, NP as PCP - General (Nurse Practitioner)  DIAGNOSIS:  Encounter Diagnosis  Name Primary?  . 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      12/12/2015 Surgery    Bilateral mastectomies: Right mastectomy: Fibroadenoma with fibrocystic changes no malignancy; left mastectomy: Fibrocystic changes no malignancy, 0/2 left sentinel nodes       CHIEF COMPLIANT: Surveillance of breast cancer  INTERVAL HISTORY: Bonnie Brown is a 37 year old with above-mentioned history of bilateral mastectomies for left breast DCIS and a prior history of Hodgkin's disease.  She is currently in surveillance.  She denies any lumps or nodules in the breast.  She has chronic tenderness in the reconstructed breasts.  Previously extensively worked up and did not find any abnormality. Patient is complaining of a palpable lump on the upper medial aspect of the left breast.  It moves.  REVIEW OF SYSTEMS:   Constitutional: Denies fevers, chills or abnormal weight loss Eyes: Denies blurriness of vision Ears, nose, mouth, throat, and face: Denies  mucositis or sore throat Respiratory: Denies cough, dyspnea or wheezes Cardiovascular: Denies palpitation, chest discomfort Gastrointestinal:  Denies nausea, heartburn or change in bowel habits Skin: Denies abnormal skin rashes Lymphatics: Denies new lymphadenopathy or easy bruising Neurological:Denies numbness, tingling or new weaknesses Behavioral/Psych: Mood is stable, no new changes  Extremities: No lower extremity edema Breast: Reconstructed breast tenderness has improved.  Small palpable lump in the upper inner quadrant of the left breast All other systems were reviewed with the patient and are negative.  I have reviewed the past medical history, past surgical history, social history and family history with the patient and they are unchanged from previous note.  ALLERGIES:  is allergic to seldane [terfenadine]; vancomycin; adhesive [tape]; codeine; penicillins; and peridex [chlorhexidine gluconate].  MEDICATIONS:  Current Outpatient Medications  Medication Sig Dispense Refill  . Cholecalciferol 4000 units CAPS Take 4,000 Units by mouth daily.    Marland Kitchen levothyroxine (SYNTHROID, LEVOTHROID) 125 MCG tablet TAKE ONE TABLET BY MOUTH DAILY BEFORE BREAKFAST 30 tablet 11  . vitamin C (ASCORBIC ACID) 500 MG tablet Take 500 mg by mouth daily.     No current facility-administered medications for this visit.     PHYSICAL EXAMINATION: ECOG PERFORMANCE STATUS: 1 - Symptomatic but completely ambulatory  Vitals:   11/21/17 0814  BP: (!) 137/95  Pulse: 76  Resp: 18  Temp: 98.2 F (36.8 C)  SpO2: 100%   Filed Weights   11/21/17 0814  Weight: 173 lb 4.8 oz (78.6 kg)    GENERAL:alert, no distress and comfortable SKIN: skin color, texture, turgor are normal,  no rashes or significant lesions EYES: normal, Conjunctiva are pink and non-injected, sclera clear OROPHARYNX:no exudate, no erythema and lips, buccal mucosa, and tongue normal  NECK: supple, thyroid normal size, non-tender, without  nodularity LYMPH:  no palpable lymphadenopathy in the cervical, axillary or inguinal LUNGS: clear to auscultation and percussion with normal breathing effort HEART: regular rate & rhythm and no murmurs and no lower extremity edema ABDOMEN:abdomen soft, non-tender and normal bowel sounds MUSCULOSKELETAL:no cyanosis of digits and no clubbing  NEURO: alert & oriented x 3 with fluent speech, no focal motor/sensory deficits EXTREMITIES: No lower extremity edema BREAST: Small lumps are palpable  in the upper inner quadrant of the left breast (exam performed in the presence of a chaperone)  LABORATORY DATA:  I have reviewed the data as listed CMP Latest Ref Rng & Units 06/19/2016 03/26/2016 12/12/2015  Glucose 65 - 99 mg/dL 87 92 128(H)  BUN 6 - 20 mg/dL '14 11 10  ' Creatinine 0.57 - 1.00 mg/dL 0.75 0.70 0.69  Sodium 134 - 144 mmol/L 141 139 137  Potassium 3.5 - 5.2 mmol/L 4.2 3.6 4.0  Chloride 96 - 106 mmol/L 98 104 105  CO2 18 - 29 mmol/L '27 29 25  ' Calcium 8.7 - 10.2 mg/dL 9.4 8.5(L) 8.2(L)  Total Protein 6.0 - 8.5 g/dL 7.2 - -  Total Bilirubin 0.0 - 1.2 mg/dL <0.2 - -  Alkaline Phos 39 - 117 IU/L 81 - -  AST 0 - 40 IU/L 13 - -  ALT 0 - 32 IU/L 12 - -    Lab Results  Component Value Date   WBC 8.3 06/19/2016   HGB 12.7 06/19/2016   HCT 38.7 06/19/2016   MCV 88 06/19/2016   PLT 481 (H) 06/19/2016   NEUTROABS 5.7 12/06/2015    ASSESSMENT & PLAN:  Breast cancer of lower-inner quadrant of left female breast ([place]) Left lumpectomy 09/06/2015: 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 Breast exam 11/21/2017: Benign 2. No role of mammogram since she had bilateral mastectomies  Survivorship: Patient has been very active and exercising and has lost 25 pounds. She wakes up early in the morning and goes to the gym.  MRI of the breasts 02/07/2017:  Done to evaluate the cause of the breast pain. No evidence of malignancy either breasts. Return to clinic in February for survivorship care plan visit.  Lump in the left breast: I recommended that we do an ultrasound evaluation.  They can biopsy this if necessary. We will arrange for this as soon as possible.  I suspect it could be fat necrosis.  We will await the final result of the ultrasound and or biopsy  Return to clinic in 1 year    No orders of the defined types were placed in this encounter.  The patient has a good understanding of the overall plan. she agrees with it. she will call with any problems that may develop before the next visit here.   Harriette Ohara, MD 11/21/17

## 2017-11-28 ENCOUNTER — Ambulatory Visit
Admission: RE | Admit: 2017-11-28 | Discharge: 2017-11-28 | Disposition: A | Discharge status: Home / Self Care | Payer: Commercial Managed Care - PPO | Source: Ambulatory Visit | Attending: Hematology and Oncology | Admitting: Hematology and Oncology

## 2017-11-28 DIAGNOSIS — C50312 Malignant neoplasm of lower-inner quadrant of left female breast: Secondary | ICD-10-CM

## 2017-11-28 DIAGNOSIS — Z170 Estrogen receptor positive status [ER+]: Principal | ICD-10-CM

## 2018-05-26 DIAGNOSIS — L719 Rosacea, unspecified: Secondary | ICD-10-CM | POA: Diagnosis not present

## 2018-05-26 DIAGNOSIS — L570 Actinic keratosis: Secondary | ICD-10-CM | POA: Diagnosis not present

## 2018-05-26 DIAGNOSIS — D239 Other benign neoplasm of skin, unspecified: Secondary | ICD-10-CM | POA: Diagnosis not present

## 2018-07-19 ENCOUNTER — Other Ambulatory Visit: Payer: Self-pay | Admitting: Adult Health

## 2018-08-12 ENCOUNTER — Encounter: Payer: Self-pay | Admitting: Adult Health

## 2018-08-12 ENCOUNTER — Ambulatory Visit (INDEPENDENT_AMBULATORY_CARE_PROVIDER_SITE_OTHER): Payer: Commercial Managed Care - PPO | Admitting: Adult Health

## 2018-08-12 ENCOUNTER — Other Ambulatory Visit (HOSPITAL_COMMUNITY)
Admission: RE | Admit: 2018-08-12 | Discharge: 2018-08-12 | Disposition: A | Discharge status: Home / Self Care | Payer: Commercial Managed Care - PPO | Source: Ambulatory Visit | Attending: Adult Health | Admitting: Adult Health

## 2018-08-12 VITALS — BP 153/104 | HR 87 | Ht 63.0 in | Wt 193.0 lb

## 2018-08-12 DIAGNOSIS — I10 Essential (primary) hypertension: Secondary | ICD-10-CM

## 2018-08-12 DIAGNOSIS — Z01419 Encounter for gynecological examination (general) (routine) without abnormal findings: Secondary | ICD-10-CM

## 2018-08-12 DIAGNOSIS — Z8571 Personal history of Hodgkin lymphoma: Secondary | ICD-10-CM | POA: Diagnosis not present

## 2018-08-12 DIAGNOSIS — Z853 Personal history of malignant neoplasm of breast: Secondary | ICD-10-CM

## 2018-08-12 DIAGNOSIS — E039 Hypothyroidism, unspecified: Secondary | ICD-10-CM

## 2018-08-12 DIAGNOSIS — Z13220 Encounter for screening for lipoid disorders: Secondary | ICD-10-CM

## 2018-08-12 DIAGNOSIS — Z131 Encounter for screening for diabetes mellitus: Secondary | ICD-10-CM

## 2018-08-12 MED ORDER — LISINOPRIL 10 MG PO TABS
10.0000 mg | ORAL_TABLET | Freq: Every day | ORAL | 3 refills | Status: DC
Start: 2018-08-12 — End: 2018-12-08

## 2018-08-12 NOTE — Progress Notes (Signed)
Patient ID: Bonnie Brown, female   DOB: April 30, 1981, 38 y.o.   MRN: [id number] History of Present Illness: Bonnie Brown is a 38 year old white female, married, G3P2, in for well woman gyn exam and pap. PCP is Bonnie Katz NP, [facility].    Current Medications, Allergies, Past Medical History, Past Surgical History, Family History and Social History were reviewed in [employer] record.     Review of Systems: Patient denies any  hearing loss, fatigue, blurred vision, shortness of breath, chest pain, abdominal pain, problems with bowel movements, urination, or intercourse. No joint pain or mood swings. +headache since Saturday.   Physical Exam:BP (!) 153/104 (BP Location: Right Arm, Patient Position: Sitting, Cuff Size: Normal)   Pulse 87   Ht 5\' 3"  (1.6 m)   Wt 193 lb (87.5 kg)   LMP 08/07/2018   BMI 34.19 kg/m  BP recheck 158/100 in right arm.  Waist 36 inches. General:  Well developed, well nourished, no acute distress Skin:  Warm and dry Neck:  Midline trachea, normal thyroid, good ROM, no lymphadenopathy Lungs; Clear to auscultation bilaterally Breast:  Sp double mastectomy with bilateral implants, well healed scars  Cardiovascular: Regular rate and rhythm Abdomen:  Soft, non tender, no hepatosplenomegaly Pelvic:  External genitalia is normal in appearance, no lesions.  The vagina is normal in appearance,+blood. Urethra has no lesions or masses. The cervix is smooth, pap with HPV performed.  Uterus is felt to be normal size, shape, and contour.  No adnexal masses or tenderness noted.Bladder is non tender, no masses felt. Extremities/musculoskeletal:  No swelling or varicosities noted, no clubbing or cyanosis Psych:  No mood changes, alert and cooperative,seems happy Fall risk is low. PHQ 2 score 0. Examination chaperoned by Bonnie Pupa LPN. Will add BP meds and recheck in 2 weeks.   Impression: 1. Encounter for gynecological examination with  Papanicolaou smear of cervix   2. Hypothyroidism, unspecified type   3. History of breast cancer   4. History of Hodgkin's disease   5. Hypertension, unspecified type   6. Screening for diabetes mellitus   7. Screening cholesterol level       Plan: Check CBC,CMP,TSH and lipids,A1c Meds ordered this encounter  Medications  . lisinopril (PRINIVIL,ZESTRIL) 10 MG tablet    Sig: Take 1 tablet (10 mg total) by mouth daily.    Dispense:  30 tablet    Refill:  3    Order Specific Question:   Supervising Provider    Answer:   Bonnie Brown [[id number]]  Has refills on levothyroxine  Review DASH diet Recheck BP in 2 weeks Try walking 20-30 minutes every day  Physical in 1 year Pap in 3 if normal

## 2018-08-12 NOTE — Patient Instructions (Signed)
DASH Eating Plan  DASH stands for "Dietary Approaches to Stop Hypertension." The DASH eating plan is a healthy eating plan that has been shown to reduce high blood pressure (hypertension). It may also reduce your risk for type 2 diabetes, heart disease, and stroke. The DASH eating plan may also help with weight loss.  What are tips for following this plan?    General guidelines   Avoid eating more than 2,300 mg (milligrams) of salt (sodium) a day. If you have hypertension, you may need to reduce your sodium intake to 1,500 mg a day.   Limit alcohol intake to no more than 1 drink a day for nonpregnant women and 2 drinks a day for men. One drink equals 12 oz of beer, 5 oz of wine, or 1 oz of hard liquor.   Work with your health care provider to maintain a healthy body weight or to lose weight. Ask what an ideal weight is for you.   Get at least 30 minutes of exercise that causes your heart to beat faster (aerobic exercise) most days of the week. Activities may include walking, swimming, or biking.   Work with your health care provider or diet and nutrition specialist (dietitian) to adjust your eating plan to your individual calorie needs.  Reading food labels     Check food labels for the amount of sodium per serving. Choose foods with less than 5 percent of the Daily Value of sodium. Generally, foods with less than 300 mg of sodium per serving fit into this eating plan.   To find whole grains, look for the word "whole" as the first word in the ingredient list.  Shopping   Buy products labeled as "low-sodium" or "no salt added."   Buy fresh foods. Avoid canned foods and premade or frozen meals.  Cooking   Avoid adding salt when cooking. Use salt-free seasonings or herbs instead of table salt or sea salt. Check with your health care provider or pharmacist before using salt substitutes.   Do not fry foods. Cook foods using healthy methods such as baking, boiling, grilling, and broiling instead.   Cook with  heart-healthy oils, such as olive, canola, soybean, or sunflower oil.  Meal planning   Eat a balanced diet that includes:  ? 5 or more servings of fruits and vegetables each day. At each meal, try to fill half of your plate with fruits and vegetables.  ? Up to 6-8 servings of whole grains each day.  ? Less than 6 oz of lean meat, poultry, or fish each day. A 3-oz serving of meat is about the same size as a deck of cards. One egg equals 1 oz.  ? 2 servings of low-fat dairy each day.  ? A serving of nuts, seeds, or beans 5 times each week.  ? Heart-healthy fats. Healthy fats called Omega-3 fatty acids are found in foods such as flaxseeds and coldwater fish, like sardines, salmon, and mackerel.   Limit how much you eat of the following:  ? Canned or prepackaged foods.  ? Food that is high in trans fat, such as fried foods.  ? Food that is high in saturated fat, such as fatty meat.  ? Sweets, desserts, sugary drinks, and other foods with added sugar.  ? Full-fat dairy products.   Do not salt foods before eating.   Try to eat at least 2 vegetarian meals each week.   Eat more home-cooked food and less restaurant, buffet, and fast food.     When eating at a restaurant, ask that your food be prepared with less salt or no salt, if possible.  What foods are recommended?  The items listed may not be a complete list. Talk with your dietitian about what dietary choices are best for you.  Grains  Whole-grain or whole-wheat bread. Whole-grain or whole-wheat pasta. Brown rice. Oatmeal. Quinoa. Bulgur. Whole-grain and low-sodium cereals. Pita bread. Low-fat, low-sodium crackers. Whole-wheat flour tortillas.  Vegetables  Fresh or frozen vegetables (raw, steamed, roasted, or grilled). Low-sodium or reduced-sodium tomato and vegetable juice. Low-sodium or reduced-sodium tomato sauce and tomato paste. Low-sodium or reduced-sodium canned vegetables.  Fruits  All fresh, dried, or frozen fruit. Canned fruit in natural juice (without  added sugar).  Meat and other protein foods  Skinless chicken or turkey. Ground chicken or turkey. Pork with fat trimmed off. Fish and seafood. Egg whites. Dried beans, peas, or lentils. Unsalted nuts, nut butters, and seeds. Unsalted canned beans. Lean cuts of beef with fat trimmed off. Low-sodium, lean deli meat.  Dairy  Low-fat (1%) or fat-free (skim) milk. Fat-free, low-fat, or reduced-fat cheeses. Nonfat, low-sodium ricotta or cottage cheese. Low-fat or nonfat yogurt. Low-fat, low-sodium cheese.  Fats and oils  Soft margarine without trans fats. Vegetable oil. Low-fat, reduced-fat, or light mayonnaise and salad dressings (reduced-sodium). Canola, safflower, olive, soybean, and sunflower oils. Avocado.  Seasoning and other foods  Herbs. Spices. Seasoning mixes without salt. Unsalted popcorn and pretzels. Fat-free sweets.  What foods are not recommended?  The items listed may not be a complete list. Talk with your dietitian about what dietary choices are best for you.  Grains  Baked goods made with fat, such as croissants, muffins, or some breads. Dry pasta or rice meal packs.  Vegetables  Creamed or fried vegetables. Vegetables in a cheese sauce. Regular canned vegetables (not low-sodium or reduced-sodium). Regular canned tomato sauce and paste (not low-sodium or reduced-sodium). Regular tomato and vegetable juice (not low-sodium or reduced-sodium). Pickles. Olives.  Fruits  Canned fruit in a light or heavy syrup. Fried fruit. Fruit in cream or butter sauce.  Meat and other protein foods  Fatty cuts of meat. Ribs. Fried meat. Bacon. Sausage. Bologna and other processed lunch meats. Salami. Fatback. Hotdogs. Bratwurst. Salted nuts and seeds. Canned beans with added salt. Canned or smoked fish. Whole eggs or egg yolks. Chicken or turkey with skin.  Dairy  Whole or 2% milk, cream, and half-and-half. Whole or full-fat cream cheese. Whole-fat or sweetened yogurt. Full-fat cheese. Nondairy creamers. Whipped toppings.  Processed cheese and cheese spreads.  Fats and oils  Butter. Stick margarine. Lard. Shortening. Ghee. Bacon fat. Tropical oils, such as coconut, palm kernel, or palm oil.  Seasoning and other foods  Salted popcorn and pretzels. Onion salt, garlic salt, seasoned salt, table salt, and sea salt. Worcestershire sauce. Tartar sauce. Barbecue sauce. Teriyaki sauce. Soy sauce, including reduced-sodium. Steak sauce. Canned and packaged gravies. Fish sauce. Oyster sauce. Cocktail sauce. Horseradish that you find on the shelf. Ketchup. Mustard. Meat flavorings and tenderizers. Bouillon cubes. Hot sauce and Tabasco sauce. Premade or packaged marinades. Premade or packaged taco seasonings. Relishes. Regular salad dressings.  Where to find more information:   National Heart, Lung, and Blood Institute: www.nhlbi.nih.gov   American Heart Association: www.heart.org  Summary   The DASH eating plan is a healthy eating plan that has been shown to reduce high blood pressure (hypertension). It may also reduce your risk for type 2 diabetes, heart disease, and stroke.   With the   DASH eating plan, you should limit salt (sodium) intake to 2,300 mg a day. If you have hypertension, you may need to reduce your sodium intake to 1,500 mg a day.   When on the DASH eating plan, aim to eat more fresh fruits and vegetables, whole grains, lean proteins, low-fat dairy, and heart-healthy fats.   Work with your health care provider or diet and nutrition specialist (dietitian) to adjust your eating plan to your individual calorie needs.  This information is not intended to replace advice given to you by your health care provider. Make sure you discuss any questions you have with your health care provider.  Document Released: 06/14/2011 Document Revised: 06/18/2016 Document Reviewed: 06/18/2016  Elsevier Interactive Patient Education  2019 [employer] Inc[employer]

## 2018-08-13 LAB — CBC
HEMATOCRIT: 38.9 % (ref 34.0–46.6)
HEMOGLOBIN: 12.9 g/dL (ref 11.1–15.9)
MCH: 29.1 pg (ref 26.6–33.0)
MCHC: 33.2 g/dL (ref 31.5–35.7)
MCV: 88 fL (ref 79–97)
Platelets: 471 10*3/uL — ABNORMAL HIGH (ref 150–450)
RBC: 4.43 x10E6/uL (ref 3.77–5.28)
RDW: 13.1 % (ref 11.7–15.4)
WBC: 10.9 10*3/uL — ABNORMAL HIGH (ref 3.4–10.8)

## 2018-08-13 LAB — LIPID PANEL
CHOL/HDL RATIO: 4.5 ratio — AB (ref 0.0–4.4)
Cholesterol, Total: 236 mg/dL — ABNORMAL HIGH (ref 100–199)
HDL: 53 mg/dL (ref >39)
LDL CALC: 155 mg/dL — AB (ref 0–99)
TRIGLYCERIDES: 140 mg/dL (ref 0–149)
VLDL CHOLESTEROL CAL: 28 mg/dL (ref 5–40)

## 2018-08-13 LAB — COMPREHENSIVE METABOLIC PANEL
A/G RATIO: 1.8 (ref 1.2–2.2)
ALBUMIN: 4.6 g/dL (ref 3.8–4.8)
ALT: 13 IU/L (ref 0–32)
AST: 13 IU/L (ref 0–40)
Alkaline Phosphatase: 85 IU/L (ref 39–117)
BUN/Creatinine Ratio: 11 (ref 9–23)
BUN: 8 mg/dL (ref 6–20)
Bilirubin Total: 0.2 mg/dL (ref 0.0–1.2)
CALCIUM: 8.7 mg/dL (ref 8.7–10.2)
CO2: 24 mmol/L (ref 20–29)
CREATININE: 0.7 mg/dL (ref 0.57–1.00)
Chloride: 98 mmol/L (ref 96–106)
GFR, EST AFRICAN AMERICAN: 128 mL/min/{1.73_m2} (ref >59)
GFR, EST NON AFRICAN AMERICAN: 111 mL/min/{1.73_m2} (ref >59)
GLOBULIN, TOTAL: 2.6 g/dL (ref 1.5–4.5)
Glucose: 88 mg/dL (ref 65–99)
POTASSIUM: 4 mmol/L (ref 3.5–5.2)
SODIUM: 139 mmol/L (ref 134–144)
TOTAL PROTEIN: 7.2 g/dL (ref 6.0–8.5)

## 2018-08-13 LAB — T4, FREE: FREE T4: 1.32 ng/dL (ref 0.82–1.77)

## 2018-08-13 LAB — HEMOGLOBIN A1C
Est. average glucose Bld gHb Est-mCnc: 117 mg/dL
Hgb A1c MFr Bld: 5.7 % — ABNORMAL HIGH (ref 4.8–5.6)

## 2018-08-13 LAB — TSH: TSH: 12.23 u[IU]/mL — ABNORMAL HIGH (ref 0.450–4.500)

## 2018-08-14 ENCOUNTER — Telehealth: Payer: Self-pay | Admitting: Adult Health

## 2018-08-14 MED ORDER — LEVOTHYROXINE SODIUM 150 MCG PO TABS
150.0000 ug | ORAL_TABLET | Freq: Every day | ORAL | 2 refills | Status: DC
Start: 2018-08-14 — End: 2018-11-05

## 2018-08-14 NOTE — Telephone Encounter (Signed)
Pt aware of labs and that TSH elevated at 12.230, will increase levothyroxine to 150 mcg and recheck labs in 8 weeks, cut carbs and increase activity

## 2018-08-15 LAB — CYTOLOGY - PAP
Diagnosis: NEGATIVE
HPV: NOT DETECTED

## 2018-08-26 ENCOUNTER — Ambulatory Visit: Payer: Commercial Managed Care - PPO | Admitting: Adult Health

## 2018-09-02 ENCOUNTER — Encounter: Payer: Self-pay | Admitting: Adult Health

## 2018-09-02 ENCOUNTER — Ambulatory Visit (INDEPENDENT_AMBULATORY_CARE_PROVIDER_SITE_OTHER): Payer: Commercial Managed Care - PPO | Admitting: Adult Health

## 2018-09-02 VITALS — BP 135/86 | HR 88 | Ht 63.0 in | Wt 195.0 lb

## 2018-09-02 DIAGNOSIS — I10 Essential (primary) hypertension: Secondary | ICD-10-CM

## 2018-09-02 DIAGNOSIS — E039 Hypothyroidism, unspecified: Secondary | ICD-10-CM | POA: Diagnosis not present

## 2018-09-02 NOTE — Progress Notes (Signed)
Patient ID: Bonnie Brown, female   DOB: 18-Feb-1981, 38 y.o.   MRN: [id number] History of Present Illness:  Bonnie Brown is a 38 year old white female, married in for BP check, she was started on lisinopril 08/12/2018 and levothyroxine was increased to 150 mcg then as TSH was 12.230. PCP is Bonnie Brown.   Current Medications, Allergies, Past Medical History, Past Surgical History, Family History and Social History were reviewed in [employer] record.     Review of Systems: Feels better, going to gym now    Physical Exam:BP 135/86 (BP Location: Right Arm, Patient Position: Sitting, Cuff Size: Normal)   Pulse 88   Ht 5\' 3"  (1.6 m)   Wt 195 lb (88.5 kg)   LMP 08/07/2018   BMI 34.54 kg/m  General:  Well developed, well nourished, no acute distress Skin:  Warm and dry Lungs; Clear to auscultation bilaterally Cardiovascular: Regular rate and rhythm Psych:  No mood changes, alert and cooperative,seems happy BP better, will continue lisinopril   Impression: 1. Hypertension, unspecified type   2. Hypothyroidism, unspecified type       Plan: Check TSH and free T4 about 4/4, orders given Continue lisinopril and working out F/U in about 3 months

## 2018-09-05 ENCOUNTER — Telehealth: Payer: Self-pay | Admitting: Hematology and Oncology

## 2018-09-05 NOTE — Telephone Encounter (Signed)
Patient called to reschedule  °

## 2018-09-05 NOTE — Telephone Encounter (Signed)
Tried to reach regarding 5/28 I did leave a message

## 2018-11-05 ENCOUNTER — Other Ambulatory Visit: Payer: Self-pay | Admitting: Adult Health

## 2018-11-06 LAB — TSH: TSH: 0.011 u[IU]/mL — AB (ref 0.450–4.500)

## 2018-11-06 LAB — T4, FREE: Free T4: 2.23 ng/dL — ABNORMAL HIGH (ref 0.82–1.77)

## 2018-11-07 ENCOUNTER — Telehealth: Payer: Self-pay | Admitting: Adult Health

## 2018-11-07 NOTE — Telephone Encounter (Signed)
Pt feeling good, will leave dose alone and recheck in June

## 2018-11-20 ENCOUNTER — Ambulatory Visit: Payer: Commercial Managed Care - PPO | Admitting: Hematology and Oncology

## 2018-11-24 NOTE — Assessment & Plan Note (Signed)
Left lumpectomy 09/06/2015: Papillary DCIS 2 cm in size, ER 95%, PR 70% with LCIS, DCIS focally 0.1 cm from inferior margin, LCIS focally at lateral margin, Tis N0 stage 0 Prior history of Hodgkin's lymphoma with spleen involvement at age 38 status post chemotherapy followed by mantle radiation  Bilateral mastectomies 12/12/2015: Right mastectomy: Fibroadenoma with fibrocystic changes no malignancy; left mastectomy: Fibrocystic changes no malignancy, 0/2 left sentinel nodes Bilateral breast reconstruction  Surveillance: 1. Breast exam 11/21/2017: Benign 2. No role of mammogram since she had bilateral mastectomies  Survivorship: Patient has been very active and exercising and has lost 25 pounds. She wakes up early in the morning and goes to the gym.  MRI of the breasts 02/07/2017:Done to evaluate the cause of the breast pain.No evidence of malignancy either breasts. Return to clinic inFebruary for survivorship care plan visit.  Lump in the left breast: Evaluated with ultrasound 11/28/2017: No evidence of malignancy  Return to clinic in 1 year

## 2018-11-27 ENCOUNTER — Telehealth: Payer: Self-pay | Admitting: Hematology and Oncology

## 2018-11-27 NOTE — Telephone Encounter (Signed)
Called patient regarding upcoming Webex appointment, patient agreed to do a Doximity call.

## 2018-11-27 NOTE — Telephone Encounter (Signed)
Called and left msg regarding a webex appt and pre reg

## 2018-11-27 NOTE — Progress Notes (Signed)
HEMATOLOGY-ONCOLOGY DOXIMITY VISIT PROGRESS NOTE  I connected with Bonnie Brown on 11/28/2018 at  9:15 AM EDT by Doximity video conference and verified that I am speaking with the correct person using two identifiers.  I discussed the limitations, risks, security and privacy concerns of performing an evaluation and management service by Doximity and the availability of in person appointments.  I also discussed with the patient that there may be a patient responsible charge related to this service. The patient expressed understanding and agreed to proceed.  Patient's Location: Home Physician Location: Clinic  CHIEF COMPLIANT: Surveillance of breast cancer  INTERVAL HISTORY: Bonnie Brown is a 38 y.o. female with above-mentioned history of left breast DCIS treated with bilateral mastectomies. She has a prior history of Hodgkin's disease. She is currently in surveillance. I last saw her a year ago. US of the left axilla following palpable lump in left breast on 11/28/2017 showed no evidence of malignancy. She presents today over Doximity for annual follow-up.     Breast cancer of lower-inner quadrant of left female breast ([facility])   07/26/2015 Mammogram    Grouped faint segmental heterogeneous calcifications spanning 5.5 cm    08/16/2015 Initial Diagnosis    Left breast biopsy: Atypical ductal hyperplasia with associated calcifications, LCIS    09/06/2015 Surgery    Left lumpectomy: Papillary DCIS 2 cm in size, ER 95%, PR 70% with LCIS, DCIS focally 0.1 cm from inferior margin, LCIS focally at lateral margin, Tis N0 stage 0    10/19/2015 Procedure    NORMAL GENETICS (32 genes: APC, ATM, AXIN2, BARD1, BMPR1A, BRCA1, BRCA2, BRIP1, CDH1, CDK4, CDKN2A, CHEK2, EPCAM, FANCC, MLH1, MSH2, MSH6, MUTYH, NBN, PALB2, PMS2, POLD1, POLE, PTEN, RAD51C, RAD51D, SCG5/GREM1, SMAD4, STK11, TP53, VHL, and XRCC2).    12/12/2015 Surgery    Bilateral mastectomies: Right mastectomy: Fibroadenoma with fibrocystic  changes no malignancy; left mastectomy: Fibrocystic changes no malignancy, 0/2 left sentinel nodes     REVIEW OF SYSTEMS:   Constitutional: Denies fevers, chills or abnormal weight loss Eyes: Denies blurriness of vision Ears, nose, mouth, throat, and face: Denies mucositis or sore throat Respiratory: Denies cough, dyspnea or wheezes Cardiovascular: Denies palpitation, chest discomfort Gastrointestinal:  Denies nausea, heartburn or change in bowel habits Skin: Denies abnormal skin rashes Lymphatics: Denies new lymphadenopathy or easy bruising Neurological:Denies numbness, tingling or new weaknesses Behavioral/Psych: Mood is stable, no new changes  Extremities: No lower extremity edema Breast: Bilateral mastectomies with reconstruction.  No palpable lumps or nodules. All other systems were reviewed with the patient and are negative.  Observations/Objective:  There were no vitals filed for this visit. There is no height or weight on file to calculate BMI.  I have reviewed the data as listed CMP Latest Ref Rng & Units 08/12/2018 06/19/2016 03/26/2016  Glucose 65 - 99 mg/dL 88 87 92  BUN 6 - 20 mg/dL '8 14 11  ' Creatinine 0.57 - 1.00 mg/dL 0.70 0.75 0.70  Sodium 134 - 144 mmol/L 139 141 139  Potassium 3.5 - 5.2 mmol/L 4.0 4.2 3.6  Chloride 96 - 106 mmol/L 98 98 104  CO2 20 - 29 mmol/L '24 27 29  ' Calcium 8.7 - 10.2 mg/dL 8.7 9.4 8.5(L)  Total Protein 6.0 - 8.5 g/dL 7.2 7.2 -  Total Bilirubin 0.0 - 1.2 mg/dL 0.2 <0.2 -  Alkaline Phos 39 - 117 IU/L 85 81 -  AST 0 - 40 IU/L 13 13 -  ALT 0 - 32 IU/L 13 12 -  Lab Results  Component Value Date   WBC 10.9 (H) 08/12/2018   HGB 12.9 08/12/2018   HCT 38.9 08/12/2018   MCV 88 08/12/2018   PLT 471 (H) 08/12/2018   NEUTROABS 5.7 12/06/2015      Assessment Plan:  Breast cancer of lower-inner quadrant of left female breast ([facility]) Left lumpectomy 09/06/2015: Papillary DCIS 2 cm in size, ER 95%, PR 70% with LCIS, DCIS focally 0.1 cm from  inferior margin, LCIS focally at lateral margin, Tis N0 stage 0 Prior history of Hodgkin's lymphoma with spleen involvement at age 71 status post chemotherapy followed by mantle radiation  Bilateral mastectomies 12/12/2015: Right mastectomy: Fibroadenoma with fibrocystic changes no malignancy; left mastectomy: Fibrocystic changes no malignancy, 0/2 left sentinel nodes Bilateral breast reconstruction  Surveillance: 1. Breast exam 11/21/2017: Benign 2. No role of mammogram since she had bilateral mastectomies  Survivorship: Patient has been very active and exercising and has lost 25 pounds. She wakes up early in the morning and goes to the gym. 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  MRI of the breasts 02/07/2017:Done to evaluate the cause of the breast pain.No evidence of malignancy either breasts. Lump in the left breast: Evaluated with ultrasound 11/28/2017: No evidence of malignancy  Return to clinic in 1 year  I discussed the assessment and treatment plan with the patient. The patient was provided an opportunity to ask questions and all were answered. The patient agreed with the plan and demonstrated an understanding of the instructions. The patient was advised to call back or seek an in-person evaluation if the symptoms worsen or if the condition fails to improve as anticipated.   I provided 15 minutes of face-to-face Doximity time during this encounter.    Rulon Eisenmenger, MD 11/28/2018   I, Molly Dorshimer, am acting as scribe for Nicholas Lose, MD.  I have reviewed the above documentation for accuracy and completeness, and I agree with the above.

## 2018-11-28 ENCOUNTER — Inpatient Hospital Stay: Payer: Commercial Managed Care - PPO | Attending: Hematology and Oncology | Admitting: Hematology and Oncology

## 2018-11-28 DIAGNOSIS — Z9011 Acquired absence of right breast and nipple: Secondary | ICD-10-CM | POA: Diagnosis not present

## 2018-11-28 DIAGNOSIS — Z170 Estrogen receptor positive status [ER+]: Secondary | ICD-10-CM

## 2018-11-28 DIAGNOSIS — C50312 Malignant neoplasm of lower-inner quadrant of left female breast: Secondary | ICD-10-CM

## 2018-12-03 ENCOUNTER — Ambulatory Visit: Payer: Self-pay | Admitting: Adult Health

## 2018-12-04 ENCOUNTER — Ambulatory Visit: Payer: 59 | Admitting: Hematology and Oncology

## 2018-12-05 ENCOUNTER — Telehealth: Payer: Self-pay | Admitting: *Deleted

## 2018-12-05 NOTE — Telephone Encounter (Signed)
Unable to leave message

## 2018-12-08 ENCOUNTER — Other Ambulatory Visit: Payer: Self-pay

## 2018-12-08 ENCOUNTER — Ambulatory Visit (INDEPENDENT_AMBULATORY_CARE_PROVIDER_SITE_OTHER): Payer: Commercial Managed Care - PPO | Admitting: Adult Health

## 2018-12-08 ENCOUNTER — Encounter: Payer: Self-pay | Admitting: Adult Health

## 2018-12-08 VITALS — BP 143/98 | HR 88 | Ht 63.0 in | Wt 182.8 lb

## 2018-12-08 DIAGNOSIS — I10 Essential (primary) hypertension: Secondary | ICD-10-CM

## 2018-12-08 DIAGNOSIS — M7989 Other specified soft tissue disorders: Secondary | ICD-10-CM | POA: Insufficient documentation

## 2018-12-08 DIAGNOSIS — E039 Hypothyroidism, unspecified: Secondary | ICD-10-CM | POA: Diagnosis not present

## 2018-12-08 MED ORDER — LISINOPRIL 10 MG PO TABS: 10.0000 mg | ORAL_TABLET | Freq: Every day | ORAL | 3 refills | Status: DC

## 2018-12-08 MED ORDER — HYDROCHLOROTHIAZIDE 12.5 MG PO CAPS: 12.5000 mg | ORAL_CAPSULE | Freq: Every day | ORAL | 3 refills | Status: DC

## 2018-12-08 NOTE — Progress Notes (Signed)
Patient ID: Bonnie Brown, female   DOB: 1980-12-05, 38 y.o.   MRN: [id number] History of Present Illness: Bonnie Brown is a 38 year old white female, married, G3P2 in for BP check and ROS.  PCP is Arsenio Katz NP.    Current Medications, Allergies, Past Medical History, Past Surgical History, Family History and Social History were reviewed in [employer] record.     Review of Systems: +swelling in hands yesterday Feels better    Physical Exam:BP (!) 143/98 (BP Location: Left Arm, Patient Position: Sitting, Cuff Size: Normal)   Pulse 88   Ht 5\' 3"  (1.6 m)   Wt 182 lb 12.8 oz (82.9 kg)   LMP 11/23/2018   BMI 32.38 kg/m  General:  Well developed, well nourished, no acute distress Skin:  Warm and dry Lungs; Clear to auscultation bilaterally Cardiovascular: Regular rate and rhythm Extremities/musculoskeletal:  No swelling today in hands or feet  Psych:  No mood changes, alert and cooperative,seems happy Has lost about 12.8 lbs since February   Impression: 1. Hypertension, unspecified type   2. Hypothyroidism, unspecified type   3. Swelling of both hands       Plan: Continue lisinopril Will add Microzide Meds ordered this encounter  Medications  . hydrochlorothiazide (MICROZIDE) 12.5 MG capsule    Sig: Take 1 capsule (12.5 mg total) by mouth daily.    Dispense:  30 capsule    Refill:  3    Order Specific Question:   Supervising Provider    Answer:   Elonda Husky, LUTHER H [[id number]]  . lisinopril (ZESTRIL) 10 MG tablet    Sig: Take 1 tablet (10 mg total) by mouth daily.    Dispense:  30 tablet    Refill:  3    Order Specific Question:   Supervising Provider    Answer:   Tania Ade H [[id number]]   Check TSH and Free T4 Follow up in 6 weeks or sooner if needed

## 2018-12-09 LAB — TSH: TSH: 0.01 u[IU]/mL — ABNORMAL LOW (ref 0.450–4.500)

## 2018-12-09 LAB — T4, FREE: Free T4: 1.96 ng/dL — ABNORMAL HIGH (ref 0.82–1.77)

## 2019-01-26 ENCOUNTER — Ambulatory Visit (INDEPENDENT_AMBULATORY_CARE_PROVIDER_SITE_OTHER): Payer: Commercial Managed Care - PPO | Admitting: Adult Health

## 2019-01-26 ENCOUNTER — Encounter: Payer: Self-pay | Admitting: Adult Health

## 2019-01-26 ENCOUNTER — Other Ambulatory Visit: Payer: Self-pay

## 2019-01-26 VITALS — BP 115/71 | HR 90 | Ht 63.0 in | Wt 179.2 lb

## 2019-01-26 DIAGNOSIS — I10 Essential (primary) hypertension: Secondary | ICD-10-CM

## 2019-01-26 DIAGNOSIS — E039 Hypothyroidism, unspecified: Secondary | ICD-10-CM | POA: Diagnosis not present

## 2019-01-26 MED ORDER — LEVOTHYROXINE SODIUM 150 MCG PO TABS: ORAL_TABLET | ORAL | 3 refills | Status: DC

## 2019-01-26 MED ORDER — LISINOPRIL 10 MG PO TABS: 10.0000 mg | ORAL_TABLET | Freq: Every day | ORAL | 3 refills | Status: DC

## 2019-01-26 NOTE — Progress Notes (Signed)
Patient ID: Bonnie Brown, female   DOB: 04-17-1981, 38 y.o.   MRN: [id number] History of Present Illness: Bonnie Brown is a 38 year old white female,married, G3P2, in for a BP. She is doing well, stopped Microzide due to headaches.Has been over worked lately. PCP is Arsenio Katz NP   Current Medications, Allergies, Past Medical History, Past Surgical History, Family History and Social History were reviewed in [employer] record.     Review of Systems: Feels good, had to stop Microzide made her have more headaches     Physical Exam:BP 115/71 (BP Location: Left Arm, Patient Position: Sitting, Cuff Size: Normal)   Pulse 90   Ht 5\' 3"  (1.6 m)   Wt 179 lb 3.2 oz (81.3 kg)   LMP 01/18/2019   BMI 31.74 kg/m  General:  Well developed, well nourished, no acute distress Skin:  Warm and dry Lungs; Clear to auscultation bilaterally Cardiovascular: Regular rate and rhythm Psych:  No mood changes, alert and cooperative,seems happy Fall risk Is low. Has lost 15 lbs since February and is walking more. TSH is 0.010, will continue meds at current dose and recheck in September, since she is feeling good.   Impression: 1. Hypertension, unspecified type   2. Hypothyroidism, unspecified type       Plan: Check TSH and Free T4 in September, orders given Meds ordered this encounter  Medications  . levothyroxine (SYNTHROID) 150 MCG tablet    Sig: TAKE 1 TABLET BY MOUTH EVERY DAY BEFORE BREAKFAST    Dispense:  30 tablet    Refill:  3    This prescription was filled on 10/16/2018. Any refills authorized will be placed on file.    Order Specific Question:   Supervising Provider    Answer:   Tania Ade H [[id number]]  . lisinopril (ZESTRIL) 10 MG tablet    Sig: Take 1 tablet (10 mg total) by mouth daily.    Dispense:  30 tablet    Refill:  3    Order Specific Question:   Supervising Provider    Answer:   Florian Buff [[id number]]  Return in February for physical

## 2019-03-28 LAB — TSH: TSH: 0.061 u[IU]/mL — ABNORMAL LOW (ref 0.450–4.500)

## 2019-03-28 LAB — T4, FREE: Free T4: 1.89 ng/dL — ABNORMAL HIGH (ref 0.82–1.77)

## 2019-04-16 ENCOUNTER — Other Ambulatory Visit: Payer: Self-pay | Admitting: Adult Health

## 2019-04-16 MED ORDER — LOSARTAN POTASSIUM 25 MG PO TABS: 25.0000 mg | ORAL_TABLET | Freq: Every day | ORAL | 11 refills | Status: DC

## 2019-04-16 NOTE — Progress Notes (Signed)
Will stop lisinopril due to cough and rx losartan

## 2019-06-12 ENCOUNTER — Other Ambulatory Visit: Payer: Self-pay | Admitting: Adult Health

## 2019-11-02 ENCOUNTER — Ambulatory Visit (INDEPENDENT_AMBULATORY_CARE_PROVIDER_SITE_OTHER): Payer: BC Managed Care – PPO | Admitting: Adult Health

## 2019-11-02 ENCOUNTER — Other Ambulatory Visit (HOSPITAL_COMMUNITY)
Admission: RE | Admit: 2019-11-02 | Discharge: 2019-11-02 | Disposition: A | Discharge status: Home / Self Care | Payer: BC Managed Care – PPO | Source: Ambulatory Visit | Attending: Adult Health | Admitting: Adult Health

## 2019-11-02 ENCOUNTER — Encounter: Payer: Self-pay | Admitting: Adult Health

## 2019-11-02 ENCOUNTER — Other Ambulatory Visit: Payer: Self-pay

## 2019-11-02 VITALS — BP 143/96 | HR 97 | Ht 63.0 in | Wt 197.0 lb

## 2019-11-02 DIAGNOSIS — Z01419 Encounter for gynecological examination (general) (routine) without abnormal findings: Secondary | ICD-10-CM

## 2019-11-02 DIAGNOSIS — N920 Excessive and frequent menstruation with regular cycle: Secondary | ICD-10-CM

## 2019-11-02 DIAGNOSIS — Z853 Personal history of malignant neoplasm of breast: Secondary | ICD-10-CM

## 2019-11-02 DIAGNOSIS — E039 Hypothyroidism, unspecified: Secondary | ICD-10-CM

## 2019-11-02 DIAGNOSIS — I10 Essential (primary) hypertension: Secondary | ICD-10-CM

## 2019-11-02 DIAGNOSIS — Z8571 Personal history of Hodgkin lymphoma: Secondary | ICD-10-CM

## 2019-11-02 DIAGNOSIS — K5900 Constipation, unspecified: Secondary | ICD-10-CM

## 2019-11-02 NOTE — Progress Notes (Signed)
Patient ID: Bonnie Brown, female   DOB: 20-Jan-1981, 39 y.o.   MRN: [id number] History of Present Illness: Chancey is a 39 year old white female,married, G3P2012 in for a well woman gyn exam and she requests a pap. She is working at [employer] now.  PCP is Lanelle Bal PA.    Current Medications, Allergies, Past Medical History, Past Surgical History, Family History and Social History were reviewed in [employer] record.     Review of Systems: Patient denies any headaches, hearing loss, fatigue, blurred vision, shortness of breath, chest pain, abdominal pain, problems with  urination, or intercourse. No joint pain or mood swings. +constipation Period lasting 7 days and heavy, may change every hour  Physical Exam:BP (!) 143/96 (BP Location: Left Arm, Patient Position: Sitting, Cuff Size: Normal)   Pulse 97   Ht 5\' 3"  (1.6 m)   Wt 197 lb (89.4 kg)   LMP 10/19/2019 (Exact Date)   BMI 34.90 kg/m  General:  Well developed, well nourished, no acute distress Skin:  Warm and dry Neck:  Midline trachea, normal thyroid, good ROM, no lymphadenopathy Lungs; Clear to auscultation bilaterally Breast:  No dominant palpable mass, retraction, or nipple discharge,sp mastectomy and implants for breast cancer ER/PR + Cardiovascular: Regular rate and rhythm Abdomen:  Soft, non tender, no hepatosplenomegaly Pelvic:  External genitalia is normal in appearance, no lesions.  The vagina is normal in appearance. Urethra has no lesions or masses. The cervix is bulbous. Pap with high risk HPV 16/18 genotyping performed. Uterus is felt to be normal size, shape, and contour.  No adnexal masses or tenderness noted.Bladder is non tender, no masses felt. Extremities/musculoskeletal:  No swelling or varicosities noted, no clubbing or cyanosis Psych:  No mood changes, alert and cooperative,seems happy AA is 1 Fall risk is low PHQ 9 score is 0. Pt gave verbal consent for exam without  chaperone.  Impression and Plan: 1. Encounter for gynecological examination with Papanicolaou smear of cervix Pap sent Physical in 1 year Pa in 3 if normal  2. History of breast cancer  3. History of Hodgkin's disease  4. Hypothyroidism, unspecified type Labs with PCP\continue synthroid   5. Hypertension, unspecified type Continue Hyzaar, from PCP  6. Menorrhagia with regular cycle Review handout on tubal and ablation Will check to see if Marshell Levan is an option Could get para guard for contraception    7. Constipation, unspecified constipation type Try Senna S

## 2019-11-03 ENCOUNTER — Telehealth: Payer: Self-pay | Admitting: Adult Health

## 2019-11-03 NOTE — Telephone Encounter (Signed)
Left message that I looked lysteda up on up to date and also discussed with Dr Elonda Husky and you can use lysteda, just let me know, or if you want tubal and ablation.

## 2019-11-04 LAB — CYTOLOGY - PAP
Adequacy: ABSENT
Comment: NEGATIVE
Diagnosis: NEGATIVE
High risk HPV: NEGATIVE

## 2020-02-22 ENCOUNTER — Encounter (HOSPITAL_COMMUNITY): Payer: Self-pay | Admitting: *Deleted

## 2020-02-22 ENCOUNTER — Other Ambulatory Visit: Payer: Self-pay

## 2020-02-22 ENCOUNTER — Emergency Department (HOSPITAL_COMMUNITY): Payer: BC Managed Care – PPO

## 2020-02-22 ENCOUNTER — Emergency Department (HOSPITAL_COMMUNITY)
Admission: EM | Admit: 2020-02-22 | Discharge: 2020-02-22 | Disposition: A | Discharge status: Home / Self Care | Payer: BC Managed Care – PPO | Attending: Emergency Medicine | Admitting: Emergency Medicine

## 2020-02-22 DIAGNOSIS — I10 Essential (primary) hypertension: Secondary | ICD-10-CM | POA: Insufficient documentation

## 2020-02-22 DIAGNOSIS — U071 COVID-19: Secondary | ICD-10-CM | POA: Diagnosis not present

## 2020-02-22 DIAGNOSIS — J181 Lobar pneumonia, unspecified organism: Secondary | ICD-10-CM | POA: Insufficient documentation

## 2020-02-22 DIAGNOSIS — J189 Pneumonia, unspecified organism: Secondary | ICD-10-CM

## 2020-02-22 DIAGNOSIS — E039 Hypothyroidism, unspecified: Secondary | ICD-10-CM | POA: Insufficient documentation

## 2020-02-22 DIAGNOSIS — M79662 Pain in left lower leg: Secondary | ICD-10-CM | POA: Insufficient documentation

## 2020-02-22 DIAGNOSIS — Z853 Personal history of malignant neoplasm of breast: Secondary | ICD-10-CM | POA: Insufficient documentation

## 2020-02-22 DIAGNOSIS — Z79899 Other long term (current) drug therapy: Secondary | ICD-10-CM | POA: Insufficient documentation

## 2020-02-22 DIAGNOSIS — M79606 Pain in leg, unspecified: Secondary | ICD-10-CM

## 2020-02-22 DIAGNOSIS — M79661 Pain in right lower leg: Secondary | ICD-10-CM | POA: Diagnosis present

## 2020-02-22 LAB — TROPONIN I (HIGH SENSITIVITY): Troponin I (High Sensitivity): 3 ng/L (ref <18)

## 2020-02-22 LAB — CBC
HCT: 44.3 % (ref 36.0–46.0)
Hemoglobin: 14.5 g/dL (ref 12.0–15.0)
MCH: 30.1 pg (ref 26.0–34.0)
MCHC: 32.7 g/dL (ref 30.0–36.0)
MCV: 91.9 fL (ref 80.0–100.0)
Platelets: 445 10*3/uL — ABNORMAL HIGH (ref 150–400)
RBC: 4.82 MIL/uL (ref 3.87–5.11)
RDW: 13.2 % (ref 11.5–15.5)
WBC: 16.9 10*3/uL — ABNORMAL HIGH (ref 4.0–10.5)
nRBC: 0 % (ref 0.0–0.2)

## 2020-02-22 LAB — BASIC METABOLIC PANEL
Anion gap: 13 (ref 5–15)
BUN: 15 mg/dL (ref 6–20)
CO2: 25 mmol/L (ref 22–32)
Calcium: 8.5 mg/dL — ABNORMAL LOW (ref 8.9–10.3)
Chloride: 98 mmol/L (ref 98–111)
Creatinine, Ser: 0.75 mg/dL (ref 0.44–1.00)
GFR calc Af Amer: >60 mL/min (ref >60)
GFR calc non Af Amer: >60 mL/min (ref >60)
Glucose, Bld: 110 mg/dL — ABNORMAL HIGH (ref 70–99)
Potassium: 3.4 mmol/L — ABNORMAL LOW (ref 3.5–5.1)
Sodium: 136 mmol/L (ref 135–145)

## 2020-02-22 LAB — POC URINE PREG, ED: Preg Test, Ur: NEGATIVE

## 2020-02-22 MED ORDER — ACETAMINOPHEN 500 MG PO TABS
1000.0000 mg | ORAL_TABLET | Freq: Once | ORAL | Status: AC
  Administered 2020-02-22: 1000 mg via ORAL
  Filled 2020-02-22: qty 2

## 2020-02-22 MED ORDER — LEVOFLOXACIN 750 MG PO TABS
750.0000 mg | ORAL_TABLET | Freq: Every day | ORAL | 0 refills | Status: DC
Start: 2020-02-22 — End: 2020-04-19

## 2020-02-22 MED ORDER — SODIUM CHLORIDE 0.9 % IV BOLUS
1000.0000 mL | Freq: Once | INTRAVENOUS | Status: AC
  Administered 2020-02-22: 1000 mL via INTRAVENOUS

## 2020-02-22 NOTE — ED Triage Notes (Signed)
C/o pain in both legs, right leg worse than left, concerned she may have a blood clot in her leg. Also c/o chest pain, tested positive for covid 1 week ago

## 2020-02-22 NOTE — ED Provider Notes (Signed)
[facility] EMERGENCY DEPARTMENT Provider Note   CSN: [id number] Arrival date & time: 02/22/20  1228     History Chief Complaint  Patient presents with  . Leg Pain    Bonnie Brown is a 39 y.o. female.  Bonnie Brown is a 39 y.o. female with a history of breast cancer, lymphoma, both in remission, hypertension, hypothyroidism, migraines, who presents to the emergency department for evaluation of bilateral calf pain. Patient reports that she tested positive for Covid 8 days ago, did receive both of her maternal vaccines and also has history of a prior Covid infection, but after having some congestion and ear pain went to see her PCP and found out she was Covid positive.  They did treat her with a course of azithromycin for an ear infection which she just finished a few days ago.  She states that she has been having some mild chest pain described as a tightness since symptoms began, denies shortness of breath reports an occasional cough and some nasal congestion.  She has been very fatigued.  No nausea vomiting or diarrhea.  She states of the past few days she has developed worsening pain in both legs worse on the right than left, pain is primarily present in the right calf and she reports it is worse when she gets up to move around or bear weight.  She denies any associated swelling or redness.  No prior history of DVT or PE.  No other aggravating or alleviating factors.        Past Medical History:  Diagnosis Date  . Breast disorder   . History of blood transfusion 1995  . History of breast cancer 08/2015  . History of chemotherapy age 64  . History of Hodgkin's lymphoma age 89  . Hypertension    under control with med., has been on med. since 04/2015  . Hypothyroidism   . Irregular heartbeat    states due to radiation treatments - no med., has not seen cardiology since 08/2013  . Migraines   . Vitamin D deficiency 06/20/2016   no current med.    Patient Active  Problem List   Diagnosis Date Noted  . Encounter for gynecological examination with Papanicolaou smear of cervix 11/02/2019  . Menorrhagia with regular cycle 11/02/2019  . Constipation 11/02/2019  . Swelling of both hands 12/08/2018  . Hypertension 08/12/2018  . Vitamin D deficiency 06/20/2016  . Essential hypertension 06/19/2016  . History of breast cancer 06/19/2016  . Genetic testing 11/01/2015  . Family history of colon cancer   . Family history of pancreatic cancer   . Breast cancer of lower-inner quadrant of left female breast ([place]) 09/13/2015  . Left breast mass 07/12/2015  . H/O total thyroidectomy 06/20/2015  . Enlarged thyroid 09/06/2014  . Skin irritation 08/16/2014  . Status post cesarean delivery 07/30/2014  . Left sciatic nerve pain 04/26/2014  . Previous cesarean delivery, antepartum 12/30/2013  . Supervision of other high-risk pregnancy 12/30/2013  . History of Hodgkin's disease 11/24/2012  . Hypothyroidism     Past Surgical History:  Procedure Laterality Date  . BREAST LUMPECTOMY WITH RADIOACTIVE SEED LOCALIZATION Left 09/06/2015   Procedure: BREAST LUMPECTOMY WITH RADIOACTIVE SEED LOCALIZATION;  Surgeon: Fanny Skates, MD;  Location: [place];  Service: General;  Laterality: Left;  . BREAST RECONSTRUCTION WITH PLACEMENT OF TISSUE EXPANDER AND FLEX HD (ACELLULAR HYDRATED DERMIS) Bilateral 12/12/2015   Procedure: BILATERAL BREAST RECONSTRUCTION WITH PLACEMENT OF TISSUE EXPANDERS AND FLEX HD (ACELLULAR  HYDRATED DERMIS);  Surgeon: Loel Lofty Dillingham, DO;  Location: [place];  Service: Plastics;  Laterality: Bilateral;  . CESAREAN SECTION N/A 11/04/2012   Procedure: Primary cesarean section with delivery of baby boy at 2007. apgars 8/9.;  Surgeon: Osborne Oman, MD;  Location: [place] [facility];  Service: Obstetrics;  Laterality: N/A;  . CESAREAN SECTION N/A 07/30/2014   Procedure: REPEAT CESAREAN SECTION;  Surgeon: Jonnie Kind, MD;  Location: [place] [facility];  Service:  Obstetrics;  Laterality: N/A;  . LIPOSUCTION Left 03/29/2016   Procedure: LIPOSUCTION;  Surgeon: Wallace Going, DO;  Location: [place];  Service: Plastics;  Laterality: Left;  . LIPOSUCTION WITH LIPOFILLING Bilateral 07/05/2016   Procedure: LIPOSUCTION WITH LIPOFILLING FROM ABDOMEN TO BILATERAL BREAST;  Surgeon: Wallace Going, DO;  Location: [place];  Service: Plastics;  Laterality: Bilateral;  . LYMPH NODE BIOPSY  1992  . MASTECTOMY W/ SENTINEL NODE BIOPSY Bilateral 12/12/2015   Procedure: LEFT TOTAL MASTECTOMY WITH LEFT AXILLARY SENTINEL LYMPH NODE BIOPSY AND RIGHT PROPHYLACTIC MASTECTOMY;  Surgeon: Fanny Skates, MD;  Location: [place];  Service: General;  Laterality: Bilateral;  . REMOVAL OF BILATERAL TISSUE EXPANDERS WITH PLACEMENT OF BILATERAL BREAST IMPLANTS Bilateral 03/29/2016   Procedure: REMOVAL OF BILATERAL TISSUE EXPANDERS WITH PLACEMENT OF BILATERAL SILCONE BREAST IMPLANTS;  Surgeon: Wallace Going, DO;  Location: [place];  Service: Plastics;  Laterality: Bilateral;  . TOTAL THYROIDECTOMY  05/16/2015  . WISDOM TOOTH EXTRACTION       OB History    Gravida  3   Para  2   Term  2   Preterm      AB  1   Living  2     SAB  1   TAB      Ectopic      Multiple  0   Live Births  1           Family History  Problem Relation Age of Onset  . Coronary artery disease Paternal Grandfather   . Diabetes Maternal Grandmother   . Stroke Maternal Grandmother   . Coronary artery disease Maternal Grandmother   . Pancreatic cancer Maternal Grandmother        dx in her 79s  . Coronary artery disease Father   . Hypertension Brother   . Hypertension Mother   . Colon cancer Paternal Aunt        dx in her 15s-70s    Social History   Tobacco Use  . Smoking status: Never Smoker  . Smokeless tobacco: Never Used  Vaping Use  . Vaping Use: Never used  Substance Use Topics  . Alcohol use: No  . Drug use:  No    Home Medications Prior to Admission medications   Medication Sig Start Date End Date Taking? Authorizing Provider  doxycycline (MONODOX) 50 MG capsule TAKE 1 CAPSULE BY MOUTH EVERY DAY WITH FOOD 07/19/18   [provider]  levofloxacin (LEVAQUIN) 750 MG tablet Take 1 tablet (750 mg total) by mouth daily. X 7 days 02/22/20   Jacqlyn Larsen, PA-C  levothyroxine (SYNTHROID) 150 MCG tablet TAKE 1 TABLET BY MOUTH DAILY BEFORE breakfast Patient taking differently: 137 mcg. TAKE 1 TABLET BY MOUTH DAILY BEFORE breakfast 06/12/19   Derrek Monaco A, NP  losartan-hydrochlorothiazide (HYZAAR) 50-12.5 MG tablet Take 1 tablet by mouth daily. 10/27/19   [provider]    Allergies    Seldane [terfenadine], Vancomycin, Adhesive [tape], Codeine, Penicillins, and Peridex [  chlorhexidine gluconate]  Review of Systems   Review of Systems  Constitutional: Positive for fatigue. Negative for chills and fever.  HENT: Positive for congestion and rhinorrhea.   Eyes: Negative for visual disturbance.  Respiratory: Positive for cough and chest tightness. Negative for shortness of breath.   Cardiovascular: Positive for chest pain. Negative for palpitations and leg swelling.  Gastrointestinal: Negative for abdominal pain, diarrhea, nausea and vomiting.  Musculoskeletal: Positive for myalgias. Negative for arthralgias.  Skin: Negative for color change and rash.  Neurological: Negative for dizziness, syncope and light-headedness.  All other systems reviewed and are negative.   Physical Exam Updated Vital Signs BP (!) 135/94   Pulse (!) 125   Temp 99 F (37.2 C)   Resp 20   SpO2 100%   Physical Exam Vitals and nursing note reviewed.  Constitutional:      General: She is not in acute distress.    Appearance: Normal appearance. She is well-developed and normal weight. She is not ill-appearing or diaphoretic.     Comments: Well-appearing and in no distress  HENT:     Head:  Normocephalic and atraumatic.     Nose: Congestion present.     Mouth/Throat:     Mouth: Mucous membranes are moist.     Pharynx: Oropharynx is clear.  Eyes:     General:        Right eye: No discharge.        Left eye: No discharge.  Cardiovascular:     Rate and Rhythm: Normal rate and regular rhythm.     Heart sounds: Normal heart sounds. No murmur heard.  No friction rub. No gallop.   Pulmonary:     Effort: Pulmonary effort is normal. No respiratory distress.     Breath sounds: Normal breath sounds. No wheezing or rales.     Comments: Respirations equal and unlabored, patient able to speak in full sentences, lungs clear to auscultation bilaterally Abdominal:     General: Bowel sounds are normal. There is no distension.     Palpations: Abdomen is soft. There is no mass.     Tenderness: There is no abdominal tenderness. There is no guarding.     Comments: Abdomen soft, nondistended, nontender to palpation in all quadrants without guarding or peritoneal signs  Musculoskeletal:        General: No deformity.     Cervical back: Neck supple.  Skin:    General: Skin is warm and dry.     Capillary Refill: Capillary refill takes less than 2 seconds.  Neurological:     Mental Status: She is alert.     Coordination: Coordination normal.     Comments: Speech is clear, able to follow commands Moves extremities without ataxia, coordination intact  Psychiatric:        Mood and Affect: Mood normal.        Behavior: Behavior normal.     ED Results / Procedures / Treatments   Labs (all labs ordered are listed, but only abnormal results are displayed) Labs Reviewed  BASIC METABOLIC PANEL - Abnormal; Notable for the following components:      Result Value   Potassium 3.4 (*)    Glucose, Bld 110 (*)    Calcium 8.5 (*)    All other components within normal limits  CBC - Abnormal; Notable for the following components:   WBC 16.9 (*)    Platelets 445 (*)    All other components within  normal limits  POC  URINE PREG, ED  TROPONIN I (HIGH SENSITIVITY)    EKG EKG Interpretation  Date/Time:  Monday February 22 2020 14:42:34 EDT Ventricular Rate:  114 PR Interval:  124 QRS Duration: 76 QT Interval:  332 QTC Calculation: 457 R Axis:   62 Text Interpretation: Sinus tachycardia Otherwise normal ECG Confirmed by Veryl Speak [phone]) on 02/22/2020 3:15:14 PM   Radiology US Venous Img Lower Bilateral (DVT)  Result Date: 02/22/2020 CLINICAL DATA:  Bilateral lower extremity pain for 1 week EXAM: BILATERAL LOWER EXTREMITY VENOUS DOPPLER ULTRASOUND TECHNIQUE: Gray-scale sonography with graded compression, as well as color Doppler and duplex ultrasound were performed to evaluate the lower extremity deep venous systems from the level of the common femoral vein and including the common femoral, femoral, profunda femoral, popliteal and calf veins including the posterior tibial, peroneal and gastrocnemius veins when visible. The superficial great saphenous vein was also interrogated. Spectral Doppler was utilized to evaluate flow at rest and with distal augmentation maneuvers in the common femoral, femoral and popliteal veins. COMPARISON:  None. FINDINGS: RIGHT LOWER EXTREMITY Common Femoral Vein: No evidence of thrombus. Normal compressibility, respiratory phasicity and response to augmentation. Saphenofemoral Junction: No evidence of thrombus. Normal compressibility and flow on color Doppler imaging. Profunda Femoral Vein: No evidence of thrombus. Normal compressibility and flow on color Doppler imaging. Femoral Vein: No evidence of thrombus. Normal compressibility, respiratory phasicity and response to augmentation. Popliteal Vein: No evidence of thrombus. Normal compressibility, respiratory phasicity and response to augmentation. Calf Veins: No evidence of thrombus. Normal compressibility and flow on color Doppler imaging. Superficial Great Saphenous Vein: No evidence of thrombus. Normal  compressibility. Venous Reflux:  None. Other Findings:  None. LEFT LOWER EXTREMITY Common Femoral Vein: No evidence of thrombus. Normal compressibility, respiratory phasicity and response to augmentation. Saphenofemoral Junction: No evidence of thrombus. Normal compressibility and flow on color Doppler imaging. Profunda Femoral Vein: No evidence of thrombus. Normal compressibility and flow on color Doppler imaging. Femoral Vein: No evidence of thrombus. Normal compressibility, respiratory phasicity and response to augmentation. Popliteal Vein: No evidence of thrombus. Normal compressibility, respiratory phasicity and response to augmentation. Calf Veins: No evidence of thrombus. Normal compressibility and flow on color Doppler imaging. Superficial Great Saphenous Vein: No evidence of thrombus. Normal compressibility. Venous Reflux:  None. Other Findings:  None. IMPRESSION: No evidence of deep venous thrombosis in either lower extremity. Electronically Signed   By: Davina Poke D.O.   On: 02/22/2020 15:56   DG Chest Portable 1 View  Result Date: 02/22/2020 CLINICAL DATA:  Chest pain.  COVID positive. EXAM: PORTABLE CHEST 1 VIEW COMPARISON:  None. FINDINGS: The cardiac silhouette, mediastinal and hilar contours are within normal limits. Calcified lymph nodes noted in the AP window region. Vague density in the right upper lobe could reflect infiltrate. No other definite infiltrates and no effusions or edema. The bony thorax is intact. IMPRESSION: Possible right upper lobe infiltrate. Calcified left AP window nodes. Electronically Signed   By: Marijo Sanes M.D.   On: 02/22/2020 16:47    Procedures Procedures (including critical care time)  Medications Ordered in ED Medications  sodium chloride 0.9 % bolus 1,000 mL (0 mLs Intravenous Stopped 02/22/20 1802)  acetaminophen (TYLENOL) tablet 1,000 mg (1,000 mg Oral Given 02/22/20 1551)    ED Course  I have reviewed the triage vital signs and the nursing  notes.  Pertinent labs & imaging results that were available during my care of the patient were reviewed by me and considered in my medical  decision making (see chart for details).    MDM Rules/Calculators/A&P                         39 year old female with known Covid infection, who was also recently treated for ear infection, presents with some chest pain as well as bilateral calf pain worse on the right over the past few days.  On arrival she has a low-grade temp and is tachycardic, but tachycardia improved without intervention.  She has had some occasional mild chest pains but reports worsening lower extremity pain over the past few days, no swelling or erythema noted.  Lungs are clear, no associated abdominal symptoms.  Will check basic labs, troponin, chest x-ray, EKG as well as bilateral DVT studies.   I have independently ordered, reviewed and interpreted all labs and imaging: CBC: Leukocytosis of 16.9, normal hemoglobin BMP: No significant electrolyte derangements, normal renal function, very minimal hypokalemia Troponin: Negative EKG: Sinus tachycardia with no acute ischemic changes Preg: negative  Chest x-ray with right upper lobe infiltrate, with associated leukocytosis this is concerning for bacterial pneumonia, and does not look is consistent with a Covid pneumonia, patient recently completed a course of azithromycin and is allergic to penicillins, will treat with 5-day course of Levaquin.  Patient with negative bilateral DVT studies, I have low suspicion for PE without other risk factors, no evidence of DVT and we now have another explanation for patient's chest pain.  Do not feel patient needs CTA of the chest.  Discussed continued symptomatic treatment at home and strict return precautions.  Patient expresses understanding and agreement with plan.  Discharged home in good condition.  AVONLEA SIMA was evaluated in Emergency Department on 02/22/2020 for the symptoms described  in the history of present illness. She was evaluated in the context of the global COVID-19 pandemic, which necessitated consideration that the patient might be at risk for infection with the SARS-CoV-2 virus that causes COVID-19. Institutional protocols and algorithms that pertain to the evaluation of patients at risk for COVID-19 are in a state of rapid change based on information released by regulatory bodies including the CDC and federal and state organizations. These policies and algorithms were followed during the patient's care in the ED.  Final Clinical Impression(s) / ED Diagnoses Final diagnoses:  [id number] virus infection  Community acquired pneumonia of right upper lobe of lung  Bilateral calf pain    Rx / DC Orders ED Discharge Orders         Ordered    levofloxacin (LEVAQUIN) 750 MG tablet  Daily     Discontinue  Reprint     02/22/20 1757           Jacqlyn Larsen, PA-C 02/22/20 2336    Varney Biles, MD 02/23/20 0022

## 2020-02-22 NOTE — ED Notes (Signed)
Attempted IV X 2 in right arm without success.

## 2020-02-22 NOTE — Discharge Instructions (Addendum)
Your chest x-ray is concerning for a community-acquired pneumonia in addition to your Covid infection, please take Levaquin as prescribed for the next 5 days, make sure you take this medication with food on your stomach and take a probiotic supplement while on this antibiotic.  Avoid strenuous physical activity while on this antibiotic.  Your ultrasound did not show any evidence of blood clots today.  People can have severe muscle aches with Covid infection, use ibuprofen and Tylenol as needed, and make sure you are staying very well-hydrated.  Follow-up with your primary care doctor, return to the emergency department for new or worsening symptoms.

## 2020-03-23 ENCOUNTER — Institutional Professional Consult (permissible substitution): Payer: BC Managed Care – PPO | Admitting: Plastic Surgery

## 2020-04-19 ENCOUNTER — Other Ambulatory Visit: Payer: Self-pay

## 2020-04-19 ENCOUNTER — Ambulatory Visit (INDEPENDENT_AMBULATORY_CARE_PROVIDER_SITE_OTHER): Payer: BC Managed Care – PPO | Admitting: Plastic Surgery

## 2020-04-19 ENCOUNTER — Encounter: Payer: Self-pay | Admitting: Plastic Surgery

## 2020-04-19 VITALS — BP 145/93 | HR 95 | Temp 98.3°F | Ht 63.0 in | Wt 187.8 lb

## 2020-04-19 DIAGNOSIS — Z9013 Acquired absence of bilateral breasts and nipples: Secondary | ICD-10-CM | POA: Diagnosis not present

## 2020-04-19 DIAGNOSIS — Z853 Personal history of malignant neoplasm of breast: Secondary | ICD-10-CM | POA: Diagnosis not present

## 2020-04-19 DIAGNOSIS — Z8571 Personal history of Hodgkin lymphoma: Secondary | ICD-10-CM | POA: Diagnosis not present

## 2020-04-19 NOTE — Progress Notes (Signed)
Patient ID: Bonnie Brown, female    DOB: 11-Mar-1981, 39 y.o.   MRN: [id number]   Chief Complaint  Patient presents with  . Consult  . Breast Problem    The patient is a 39 year old female here for evaluation of her breasts.  She was diagnosed with left breast cancer in 2017 and had bilateral mastectomies for lobular carcinoma in situ.  She had significant ptosis of her breasts at the time.  She has a history of radiation for Hodgkin's disease in 59 when she was 49 years old.  Her husband's mother and sister were treated for BRCA positive breast cancer.  In September 2017 she had exchange after her expanders were placed and had implants placed which are [employer] smooth round ultra high profile gel 455 cc implants.  In December 0076 she had lipophilic for improved symmetry.  Overall she is doing extremely well.  She has not had any follow-up since and so she wanted to be sure everything was okay.  She is also interested in in continuing the nipple areolar reconstruction that was started with the tattooing.  I do not feel any lumps or bumps at this time and her implants and her implants appear to be intact.   Review of Systems  Constitutional: Negative.   HENT: Negative.   Eyes: Negative.   Respiratory: Negative.   Cardiovascular: Negative.   Gastrointestinal: Negative.   Endocrine: Negative.   Genitourinary: Negative.   Musculoskeletal: Negative.   Psychiatric/Behavioral: Negative.     Past Medical History:  Diagnosis Date  . Breast disorder   . History of blood transfusion 1995  . History of breast cancer 08/2015  . History of chemotherapy age 42  . History of Hodgkin's lymphoma age 72  . Hypertension    under control with med., has been on med. since 04/2015  . Hypothyroidism   . Irregular heartbeat    states due to radiation treatments - no med., has not seen cardiology since 08/2013  . Migraines   . Vitamin D deficiency 06/20/2016   no current med.    Past Surgical  History:  Procedure Laterality Date  . BREAST LUMPECTOMY WITH RADIOACTIVE SEED LOCALIZATION Left 09/06/2015   Procedure: BREAST LUMPECTOMY WITH RADIOACTIVE SEED LOCALIZATION;  Surgeon: Fanny Skates, MD;  Location: [place];  Service: General;  Laterality: Left;  . BREAST RECONSTRUCTION WITH PLACEMENT OF TISSUE EXPANDER AND FLEX HD (ACELLULAR HYDRATED DERMIS) Bilateral 12/12/2015   Procedure: BILATERAL BREAST RECONSTRUCTION WITH PLACEMENT OF TISSUE EXPANDERS AND FLEX HD (ACELLULAR HYDRATED DERMIS);  Surgeon: Loel Lofty Adalin Vanderploeg, DO;  Location: [place];  Service: Plastics;  Laterality: Bilateral;  . CESAREAN SECTION N/A 11/04/2012   Procedure: Primary cesarean section with delivery of baby boy at 2007. apgars 8/9.;  Surgeon: Osborne Oman, MD;  Location: [place] [facility];  Service: Obstetrics;  Laterality: N/A;  . CESAREAN SECTION N/A 07/30/2014   Procedure: REPEAT CESAREAN SECTION;  Surgeon: Jonnie Kind, MD;  Location: [place] [facility];  Service: Obstetrics;  Laterality: N/A;  . LIPOSUCTION Left 03/29/2016   Procedure: LIPOSUCTION;  Surgeon: Wallace Going, DO;  Location: [place];  Service: Plastics;  Laterality: Left;  . LIPOSUCTION WITH LIPOFILLING Bilateral 07/05/2016   Procedure: LIPOSUCTION WITH LIPOFILLING FROM ABDOMEN TO BILATERAL BREAST;  Surgeon: Wallace Going, DO;  Location: [place];  Service: Plastics;  Laterality: Bilateral;  . LYMPH NODE BIOPSY  1992  . MASTECTOMY W/ SENTINEL NODE BIOPSY Bilateral 12/12/2015  Procedure: LEFT TOTAL MASTECTOMY WITH LEFT AXILLARY SENTINEL LYMPH NODE BIOPSY AND RIGHT PROPHYLACTIC MASTECTOMY;  Surgeon: Fanny Skates, MD;  Location: [place];  Service: General;  Laterality: Bilateral;  . REMOVAL OF BILATERAL TISSUE EXPANDERS WITH PLACEMENT OF BILATERAL BREAST IMPLANTS Bilateral 03/29/2016   Procedure: REMOVAL OF BILATERAL TISSUE EXPANDERS WITH PLACEMENT OF BILATERAL SILCONE BREAST IMPLANTS;  Surgeon: Wallace Going, DO;  Location: [place];  Service: Plastics;  Laterality: Bilateral;  . TOTAL THYROIDECTOMY  05/16/2015  . WISDOM TOOTH EXTRACTION        Current Outpatient Medications:  .  levothyroxine (SYNTHROID) 150 MCG tablet, TAKE 1 TABLET BY MOUTH DAILY BEFORE breakfast (Patient taking differently: 137 mcg. TAKE 1 TABLET BY MOUTH DAILY BEFORE breakfast), Disp: 30 tablet, Rfl: 3 .  losartan-hydrochlorothiazide (HYZAAR) 50-12.5 MG tablet, Take 1 tablet by mouth daily., Disp: , Rfl:    Objective:   Vitals:   04/19/20 1500  BP: (!) 145/93  Pulse: 95  Temp: 98.3 F (36.8 C)  SpO2: 100%    Physical Exam Vitals and nursing note reviewed.  Constitutional:      Appearance: Normal appearance.  HENT:     Head: Normocephalic and atraumatic.  Cardiovascular:     Rate and Rhythm: Normal rate.     Pulses: Normal pulses.  Pulmonary:     Effort: Pulmonary effort is normal. No respiratory distress.  Abdominal:     General: Abdomen is flat. There is no distension.  Skin:    General: Skin is warm.     Capillary Refill: Capillary refill takes less than 2 seconds.  Neurological:     General: No focal deficit present.     Mental Status: She is alert and oriented to person, place, and time.  Psychiatric:        Mood and Affect: Mood normal.        Behavior: Behavior normal.        Thought Content: Thought content normal.     Assessment & Plan:  History of Hodgkin's disease  History of breast cancer  Acquired absence of bilateral breasts and nipples   Recommend ultrasound for evaluation of bilateral silicone implants.  I also had her talk with [facility] for the nipple areolar reconstruction.  I would like to talk with her after the ultrasound to be sure she has appropriate follow-up with [place].  Pictures were obtained of the patient and placed in the chart with the patient's or guardian's permission.   [place], DO

## 2020-05-04 ENCOUNTER — Other Ambulatory Visit: Payer: BC Managed Care – PPO

## 2020-05-17 ENCOUNTER — Other Ambulatory Visit: Payer: Self-pay | Admitting: Plastic Surgery

## 2020-05-17 ENCOUNTER — Ambulatory Visit
Admission: RE | Admit: 2020-05-17 | Discharge: 2020-05-17 | Disposition: A | Discharge status: Home / Self Care | Payer: BC Managed Care – PPO | Source: Ambulatory Visit | Attending: Plastic Surgery | Admitting: Plastic Surgery

## 2020-05-17 ENCOUNTER — Other Ambulatory Visit: Payer: Self-pay

## 2020-05-17 DIAGNOSIS — Z9013 Acquired absence of bilateral breasts and nipples: Secondary | ICD-10-CM

## 2020-05-17 DIAGNOSIS — Z853 Personal history of malignant neoplasm of breast: Secondary | ICD-10-CM

## 2020-05-17 DIAGNOSIS — Z8571 Personal history of Hodgkin lymphoma: Secondary | ICD-10-CM

## 2020-06-08 ENCOUNTER — Telehealth: Payer: Self-pay

## 2020-06-08 NOTE — Telephone Encounter (Signed)
Call to pt - per Dr. Marla Roe- no answer/left v/m requesting call back from pt

## 2020-06-09 ENCOUNTER — Telehealth: Payer: Self-pay

## 2020-06-09 NOTE — Telephone Encounter (Signed)
Call to pt per Dr. Marla Roe- I called to check pt's status & to confirm that she had received her results from her ultrasound of right breast. Pt has had breast pain in right breast & ultrasound was negative for any evidence of malignancy and no mass or lesion noted. She reports that the pain she was having has resolved & she denies any other complication. She will keep her f/u on 06/13/20 with me for nipple/areola tattoo.

## 2020-06-13 ENCOUNTER — Ambulatory Visit (INDEPENDENT_AMBULATORY_CARE_PROVIDER_SITE_OTHER): Payer: BC Managed Care – PPO

## 2020-06-13 ENCOUNTER — Other Ambulatory Visit: Payer: Self-pay

## 2020-06-13 VITALS — BP 129/84 | HR 72 | Temp 97.5°F | Ht 63.0 in | Wt 185.0 lb

## 2020-06-13 DIAGNOSIS — Z9013 Acquired absence of bilateral breasts and nipples: Secondary | ICD-10-CM

## 2020-07-20 NOTE — Patient Instructions (Signed)
Pt will use vaseline/xeroform & gauze dressing for approx. 10 days or until irritation/drainage has subsided. Pt will use moisturizer/sunscreen as needed. She understands that color/shade will fade 30-40% which will require touch-up tattoo sessions. She will call for any concerns or questions-otherwise she will f/u in 6 weeks for touch-up if needed.

## 2020-07-20 NOTE — Progress Notes (Signed)
NIPPLE AREOLAR TATTOO PROCEDURE  PREOPERATIVE DIAGNOSIS:  Acquired absence of (BILATERAL) nipple areolar   POSTOPERATIVE DIAGNOSIS: Acquired absence of (BILATERAL) nipple areolar    PROCEDURES: (BILATERAL) nipple areolar tattoo   ATTENDING SURGEON: Dr. Lyndee Leo Dillingham   ANESTHESIA:  EMLA  COMPLICATIONS: None.  JUSTIFICATION FOR PROCEDURE:  Ms. Fraiser is a 40 y.o. female with a history of breast cancer status post bilateral breast reconstruction. The patient presents for bilateral nipple areolar complex tattoo. Risks, benefits, indications, and alternatives of the above described procedures were discussed with the patient and all the patient's questions were answered.   DESCRIPTION OF PROCEDURE: After informed consent was obtained and proper identification of patient and surgical site was made, the patient was taken to the procedure room and  Pre-procedure photos taken & entered into chart. Placement/size & colors were chosen by patient. Pt was then placed supine on the operating room table.  A time out was performed to confirm patient's identity and surgical site. The patient was prepped and draped in the usual sterile fashion.   Using a # 7 tattoo head, pigment was instilled to the designed nipple areolar complex.  Once adequate pigment had been applied to the nipple areolar complex a post-procedure photo was taken. Vaseline/xeroform & gauze dressing was applied. The patient tolerated the procedure well.  Post care instructions were reviewed.  Ink used- World Famous Ink= Lucretia [employer] lot# [id number] / exp. 01/17/2023 [place] lot# [id number] exp. 04/03/2022 Fair Honey lot# [id number] / exp. 06/17/2022 Illa Level lot# [id number] / exp. 5/11/WFP2024 Warm Mink lot# [id number] / exp.06/17/2022 Duration lot# [id number] / exp. 09/2021

## 2020-08-22 ENCOUNTER — Other Ambulatory Visit: Payer: Self-pay

## 2020-08-22 ENCOUNTER — Ambulatory Visit (INDEPENDENT_AMBULATORY_CARE_PROVIDER_SITE_OTHER): Payer: BC Managed Care – PPO

## 2020-08-22 VITALS — BP 137/90 | HR 90 | Temp 97.3°F | Ht 63.0 in | Wt 183.0 lb

## 2020-08-22 DIAGNOSIS — Z9013 Acquired absence of bilateral breasts and nipples: Secondary | ICD-10-CM

## 2020-08-29 NOTE — Patient Instructions (Signed)
Reviewed post-procedure care with patient: Use vaseline/xeroform & gauze dressing for approx 2 weeks. She understands that the colors/shades may fade up to 40%- which could require touch-up sessions in the future. She know she may have redness/swelling & irritation until healed. Call for any concerns- otherwise f/u in 6 weeks

## 2020-08-29 NOTE — Progress Notes (Signed)
NIPPLE AREOLAR TATTOO PROCEDURE  PREOPERATIVE DIAGNOSIS:  Acquired absence of (BILATERAL) nipple areolar   POSTOPERATIVE DIAGNOSIS: Acquired absence of (BILATERAL) nipple areolar    PROCEDURES: (BILATERAL) nipple areolar tattoo touch-up  ATTENDING SURGEON: Dr. Lyndee Leo Dillingham   ANESTHESIA:  EMLA  COMPLICATIONS: None.  JUSTIFICATION FOR PROCEDURE:  Bonnie Brown is a 40 y.o. female with a history of breast cancer status post bilateral breast reconstruction. The patient presents for bilateral nipple areolar complex tattoo touch-up. Risks, benefits, indications, and alternatives of the above described procedures were discussed with the patient and all the patient's questions were answered.   DESCRIPTION OF PROCEDURE: After informed consent was obtained and proper identification of patient and surgical site was made, the patient was taken to the procedure room and pre-procedure photos were taken & entered into chart. Pt was then placed supine on the operating room table. A time out was performed to confirm patient's identity and surgical site. The patient was prepped and draped in the usual sterile fashion.   Using a # 7 tattoo head, pigment was instilled to the designed bilateral nipple areolar complex.  Once adequate pigment had been applied  A post-procedure photo taken. vaseline/ xeroform & gauze  dressing was applied. The patient tolerated the procedure well.   World Famous Tattoo Ink used: Wellsite geologist Fair Honey/ Tan [employer] & Duration used for anesthesia. Lot#'s & exp dates on file

## 2020-10-31 ENCOUNTER — Ambulatory Visit: Payer: BC Managed Care – PPO

## 2020-11-02 ENCOUNTER — Other Ambulatory Visit: Payer: Self-pay

## 2020-11-02 ENCOUNTER — Encounter: Payer: Self-pay | Admitting: Adult Health

## 2020-11-02 ENCOUNTER — Ambulatory Visit (INDEPENDENT_AMBULATORY_CARE_PROVIDER_SITE_OTHER): Payer: BC Managed Care – PPO | Admitting: Adult Health

## 2020-11-02 VITALS — BP 142/94 | HR 84 | Ht 63.0 in | Wt 199.5 lb

## 2020-11-02 DIAGNOSIS — N926 Irregular menstruation, unspecified: Secondary | ICD-10-CM | POA: Diagnosis not present

## 2020-11-02 DIAGNOSIS — Z8571 Personal history of Hodgkin lymphoma: Secondary | ICD-10-CM

## 2020-11-02 DIAGNOSIS — Z853 Personal history of malignant neoplasm of breast: Secondary | ICD-10-CM | POA: Diagnosis not present

## 2020-11-02 DIAGNOSIS — Z01419 Encounter for gynecological examination (general) (routine) without abnormal findings: Secondary | ICD-10-CM

## 2020-11-02 NOTE — Progress Notes (Signed)
Patient ID: Bonnie Brown, female   DOB: July 16, 1980, 40 y.o.   MRN: [id number] History of Present Illness: Bonnie Brown is a 40 year old white female, married, [id number], in for well woman gyn exam, she had a normal pap with negative HPV 11/02/19. She had labs with PCP few weeks ago and had synthroid adjusted. Has not had a period since 09/04/20, with multiple negative HPTs. PCP is Lanelle Bal PA.   Current Medications, Allergies, Past Medical History, Past Surgical History, Family History and Social History were reviewed in [employer] record.     Review of Systems: Patient denies any  hearing loss, fatigue, blurred vision, shortness of breath, chest pain, abdominal pain, problems with  urination, or intercourse(has occasional discomfort). No joint pain or mood swings. Having frequent migraines and may alternate constipation and diarrhea.  No period since February   Physical Exam:BP (!) 142/94 (BP Location: Left Arm, Patient Position: Sitting, Cuff Size: Normal)   Pulse 84   Ht 5\' 3"  (1.6 m)   Wt 199 lb 8 oz (90.5 kg)   LMP 09/04/2020   BMI 35.34 kg/m  General:  Well developed, well nourished, no acute distress Skin:  Warm and dry Neck:  Midline trachea, normal thyroid, good ROM, no lymphadenopathy Lungs; Clear to auscultation bilaterally Breast: sp bilateral mastectomy for left breast cancer +ER/PR,has had reconstruction  Cardiovascular: Regular rate and rhythm Abdomen:  Soft, non tender, no hepatosplenomegaly Pelvic:  External genitalia is normal in appearance, no lesions.  The vagina is normal in appearance. Urethra has no lesions or masses. The cervix is smooth.  Uterus is felt to be normal size, shape, and contour.  No adnexal masses or tenderness noted.Bladder is non tender, no masses felt. Rectal: deferred  Extremities/musculoskeletal:  No swelling or varicosities noted, no clubbing or cyanosis Psych:  No mood changes, alert and cooperative,seems happy AA  is 2 Fall risk is low PHQ 9 score is 2 GAD 7 score is 0  Upstream - 11/02/20 0835      Pregnancy Intention Screening   Does the patient want to become pregnant in the next year? No    Does the patient's partner want to become pregnant in the next year? No    Would the patient like to discuss contraceptive options today? No      Contraception Wrap Up   Current Method No Method - Other Reason    End Method No Method - Other Reason    Contraception Counseling Provided No         Examination chaperoned by Estill Bamberg LPN  Impression and Plan; 1. Encounter for well woman exam with routine gynecological exam Physical in 1 year Pap in 2024 Labs with PCP  2. Missed periods Call if no period by end of May, will get [facility], may get [place] to assess uterus   3. History of breast cancer  4. History of Hodgkin's disease

## 2020-11-24 ENCOUNTER — Ambulatory Visit (INDEPENDENT_AMBULATORY_CARE_PROVIDER_SITE_OTHER): Payer: BC Managed Care – PPO

## 2020-11-24 ENCOUNTER — Other Ambulatory Visit: Payer: Self-pay

## 2020-11-24 VITALS — BP 132/86 | HR 84 | Temp 98.4°F

## 2020-11-24 DIAGNOSIS — Z9013 Acquired absence of bilateral breasts and nipples: Secondary | ICD-10-CM

## 2020-11-25 ENCOUNTER — Telehealth: Payer: Self-pay

## 2020-11-25 NOTE — Telephone Encounter (Signed)
Patient called to advise that she saw her PCP per Bonita's request for the area on her breast. PCP thought it was possible shingles or cellulitis so they gave her a prescription for Keflex, Valtrex, cortisone. Please call patient to advise next steps and how she should proceed with NAC tattoo.

## 2020-12-07 ENCOUNTER — Telehealth: Payer: Self-pay

## 2020-12-07 NOTE — Telephone Encounter (Signed)
Call to pt - no answer/ left voicemail requesting call back with update on her symptoms

## 2020-12-07 NOTE — Patient Instructions (Signed)
Patient will contact her PCP for symptoms & we reschedule her tattoo after symptoms resolve.

## 2020-12-07 NOTE — Progress Notes (Signed)
11/24/20 Patient in office for bilateral NAC tattoo touch-up. Upon examination- pt presents with a red/irritated rash on her right breast- lateral aspect. Patient noticed this today - but denies any pain/itching/burning & no drainage. She denies any contact with poison ivy/oak & does not recall any other irritant. She has had Hx of Shingles, which she states started in the same manner- with small red/"bumps" I informed her that I could not perform the tattoo touch-up today & recommended she see her PCP for the symptom  I took photos- & entered into chart.  I will consult with Dr. Marla Roe to review photos & symptoms. We will reschedule the tattoo after symptoms resolve. Pt understands & agrees with the above plan.

## 2021-02-20 ENCOUNTER — Telehealth: Payer: Self-pay | Admitting: Adult Health

## 2021-02-20 NOTE — Telephone Encounter (Signed)
Left message to call me about labs

## 2021-02-21 ENCOUNTER — Telehealth: Payer: Self-pay | Admitting: Adult Health

## 2021-02-21 NOTE — Telephone Encounter (Signed)
Reviewed labs with her, her PCP decreased synthroid to 100 mcg, having hot flashes esp at night and vaginal dryness, decreased libido, tired, heart races at times and BP up and down, to see cardiologist, try replens or luvena for vaginal moisture and astroglide with sex and have sex regularly. No HRT due to breast cancer, but could try SSRI. Keep posted

## 2021-03-20 ENCOUNTER — Encounter: Payer: Self-pay | Admitting: *Deleted

## 2021-03-21 ENCOUNTER — Telehealth: Payer: Self-pay | Admitting: Cardiology

## 2021-03-21 ENCOUNTER — Ambulatory Visit (INDEPENDENT_AMBULATORY_CARE_PROVIDER_SITE_OTHER): Payer: BC Managed Care – PPO

## 2021-03-21 ENCOUNTER — Other Ambulatory Visit: Payer: Self-pay | Admitting: Cardiology

## 2021-03-21 ENCOUNTER — Ambulatory Visit (INDEPENDENT_AMBULATORY_CARE_PROVIDER_SITE_OTHER): Payer: BC Managed Care – PPO | Admitting: Cardiology

## 2021-03-21 ENCOUNTER — Encounter: Payer: Self-pay | Admitting: Cardiology

## 2021-03-21 ENCOUNTER — Encounter: Payer: Self-pay | Admitting: *Deleted

## 2021-03-21 VITALS — BP 167/109 | HR 88 | Ht 63.0 in | Wt 190.6 lb

## 2021-03-21 DIAGNOSIS — R000 Tachycardia, unspecified: Secondary | ICD-10-CM

## 2021-03-21 DIAGNOSIS — R002 Palpitations: Secondary | ICD-10-CM

## 2021-03-21 NOTE — Patient Instructions (Addendum)
Medication Instructions:  Continue all current medications.  Labwork: none  Testing/Procedures: Your physician has recommended that you wear a 14 day event monitor. Event monitors are medical devices that record the heart's electrical activity. Doctors most often [place] these monitors to diagnose arrhythmias. Arrhythmias are problems with the speed or rhythm of the heartbeat. The monitor is a small, portable device. You can wear one while you do your normal daily activities. This is usually used to diagnose what is causing palpitations/syncope (passing out). Office will contact with results via phone or letter.    Follow-Up: 6 weeks    Any Other Special Instructions Will Be Listed Below (If Applicable).   If you need a refill on your cardiac medications before your next appointment, please call your pharmacy.

## 2021-03-21 NOTE — Telephone Encounter (Signed)
PERCERT:    14 day zio - tachycardia

## 2021-03-21 NOTE — Progress Notes (Signed)
Clinical Summary Ms. Mazak is a 40 y.o.female seen as a new patient, last seen by Dr Bronson Ing in 11/2016   1.History of chest pain - previously evaluted in 2018, negative stress echo at that time    2. HTN - has not taken bp meds yet today.  - home bp's 120s/60s, though can vary at times.   3.Sinus tachycardia/palpitations - prior history of palpitations in her 67s - evaluated at [facility] at the time, negative workup. Wore event monitor at the time.  - 02/2021 TSH was 0.255 - thyroid medication was lowered at last pcp visit  - has had covid x 2, last time in August - got booster in January  - recent palpitations, occurs few times a week. No specific trigger.   - episode at work, initially felt heart racing. Checked HR and 150s. Felt dizzy. Lasted about 20 minutes - episode on vacation, while walking up the stairs felt heart racing, some SOB. Weak in the legs. REsolved after a few minutes  - severe episodes have improved somewhat with lower thyroid dosing - still high heart rates by apple watch  - has weaned caffeine. Rare caffeine. Infrequent EtOH  4. Heart murmur - recent echo at [facility] with mild aortic valve thickening, hyperdynamic LV  5. History of childhood cancer - history of chemo radiation to chest  6. 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 since 04/2015   Hypothyroidism    Irregular heartbeat    states due to radiation treatments - no med., has not seen cardiology since 08/2013   Migraines    Vitamin D deficiency 06/20/2016   no current med.     Allergies  Allergen Reactions   Seldane [Terfenadine] Other (See Comments)    INTERACTED WITH CHEMO DRUGS   Vancomycin  Itching, Rash and Other (See Comments)    FLUSHING OF SKIN    Adhesive [Tape] Itching and Other (See Comments)    SKIN REDNESS   Codeine Rash   Penicillins Rash        Peridex [Chlorhexidine Gluconate] Rash and Other (See Comments)    REDNESS OF SKIN     Current Outpatient Medications  Medication Sig Dispense Refill   doxycycline (MONODOX) 50 MG capsule Take 50 mg by mouth daily.     levothyroxine (SYNTHROID) 112 MCG tablet Take 112 mcg by mouth daily before breakfast.     lisinopril-hydrochlorothiazide (ZESTORETIC) 20-12.5 MG tablet Take 1 tablet by mouth daily.     No current facility-administered medications for this visit.     Past Surgical History:  Procedure Laterality Date   BREAST LUMPECTOMY WITH RADIOACTIVE SEED LOCALIZATION Left 09/06/2015   Procedure: BREAST LUMPECTOMY WITH RADIOACTIVE SEED LOCALIZATION;  Surgeon: Fanny Skates, MD;  Location: [place];  Service: General;  Laterality: Left;   BREAST RECONSTRUCTION WITH PLACEMENT OF TISSUE EXPANDER AND FLEX HD (ACELLULAR HYDRATED DERMIS) Bilateral 12/12/2015   Procedure: BILATERAL BREAST RECONSTRUCTION WITH PLACEMENT OF TISSUE EXPANDERS AND FLEX HD (ACELLULAR HYDRATED DERMIS);  Surgeon: Loel Lofty Dillingham, DO;  Location: [place];  Service: Plastics;  Laterality: Bilateral;   CESAREAN SECTION N/A 11/04/2012   Procedure: Primary cesarean  section with delivery of baby boy at 2007. apgars 8/9.;  Surgeon: Osborne Oman, MD;  Location: [facility];  Service: Obstetrics;  Laterality: N/A;   CESAREAN SECTION N/A 07/30/2014   Procedure: REPEAT CESAREAN SECTION;  Surgeon: Jonnie Kind, MD;  Location: [facility];  Service: Obstetrics;  Laterality: N/A;   LIPOSUCTION Left 03/29/2016   Procedure: LIPOSUCTION;  Surgeon: Wallace Going, DO;  Location: [employer] [facility];  Service: Plastics;  Laterality: Left;   LIPOSUCTION WITH LIPOFILLING Bilateral 07/05/2016   Procedure: LIPOSUCTION WITH LIPOFILLING FROM ABDOMEN  TO BILATERAL BREAST;  Surgeon: Wallace Going, DO;  Location: [place];  Service: Plastics;  Laterality: Bilateral;   LYMPH NODE BIOPSY  1992   MASTECTOMY W/ SENTINEL NODE BIOPSY Bilateral 12/12/2015   Procedure: LEFT TOTAL MASTECTOMY WITH LEFT AXILLARY SENTINEL LYMPH NODE BIOPSY AND RIGHT PROPHYLACTIC MASTECTOMY;  Surgeon: Fanny Skates, MD;  Location: [place];  Service: General;  Laterality: Bilateral;   REMOVAL OF BILATERAL TISSUE EXPANDERS WITH PLACEMENT OF BILATERAL BREAST IMPLANTS Bilateral 03/29/2016   Procedure: REMOVAL OF BILATERAL TISSUE EXPANDERS WITH PLACEMENT OF BILATERAL SILCONE BREAST IMPLANTS;  Surgeon: Wallace Going, DO;  Location: [place];  Service: Plastics;  Laterality: Bilateral;   TOTAL THYROIDECTOMY  05/16/2015   WISDOM TOOTH EXTRACTION       Allergies  Allergen Reactions   Seldane [Terfenadine] Other (See Comments)    INTERACTED WITH CHEMO DRUGS   Vancomycin Itching, Rash and Other (See Comments)    FLUSHING OF SKIN    Adhesive [Tape] Itching and Other (See Comments)    SKIN REDNESS   Codeine Rash   Penicillins Rash        Peridex [Chlorhexidine Gluconate] Rash and Other (See Comments)    REDNESS OF SKIN      Family History  Problem Relation Age of Onset   Coronary artery disease Paternal Grandfather    Diabetes Maternal Grandmother    Stroke Maternal Grandmother    Coronary artery disease Maternal Grandmother    Pancreatic cancer Maternal Grandmother        dx in her 19s   Coronary artery disease Father    Hypertension Brother    Hypertension Mother    Colon cancer Paternal Aunt        dx in her 71s-70s     Social History Ms. Aydin reports that she has never smoked. She has never used smokeless tobacco. Ms. Sarpong reports no history of alcohol use.   Review of Systems CONSTITUTIONAL: No weight loss, fever, chills, weakness or fatigue.  HEENT: Eyes: No visual loss, blurred vision, double vision or  yellow sclerae.No hearing loss, sneezing, congestion, runny nose or sore throat.  SKIN: No rash or itching.  CARDIOVASCULAR: per hpi RESPIRATORY: No shortness of breath, cough or sputum.  GASTROINTESTINAL: No anorexia, nausea, vomiting or diarrhea. No abdominal pain or blood.  GENITOURINARY: No burning on urination, no polyuria NEUROLOGICAL: No headache, dizziness, syncope, paralysis, ataxia, numbness or tingling in the extremities. No change in bowel or bladder control.  MUSCULOSKELETAL: No muscle, back pain, joint pain or stiffness.  LYMPHATICS: No enlarged nodes. No history of splenectomy.  PSYCHIATRIC: No history of depression or anxiety.  ENDOCRINOLOGIC: No reports of sweating, cold or heat intolerance. No polyuria or polydipsia.  Marland Kitchen   Physical Examination Today's Vitals   03/21/21 1049  BP: (!) 167/109  Pulse: 88  SpO2: 100%  Weight: 190 lb 9.6 oz (86.5 kg)  Height: '5\' 3"'$  (1.6  m)   Body mass index is 33.76 kg/m.  Gen: resting comfortably, no acute distress HEENT: no scleral icterus, pupils equal round and reactive, no palptable cervical adenopathy,  CV: RRR, 2/6 systolic murmur rusb,  no jvd Resp: Clear to auscultation bilaterally GI: abdomen is soft, non-tender, non-distended, normal bowel sounds, no hepatosplenomegaly MSK: extremities are warm, no edema.  Skin: warm, no rash Neuro:  no focal deficits Psych: appropriate affect   Diagnostic Studies  10/2016 stress echo Study Conclusions   - Stress ECG conclusions: The stress ECG was normal. [facility] scoring:    exercise time of 10.5 min; maximum ST deviation of 0 mm; no    angina; resulting score is 11. This score predicts a low risk of    cardiac events.  - Staged echo: Resting LV systolic function was normal, LVEF    60-65%. With exercise, there was normal augmentation of all wall    segments with a hypercontractile response seen, LVEF 80%. No    stress induced ischemia. Normal echo stress   Impressions:   -  Normal study after maximal exercise.    02/2021 echo Summary    1. Technically difficult study.    2. The left ventricle is normal in size with normal wall thickness.    3. The left ventricular systolic function is normal, LVEF is visually  estimated at 65-70%.    4. The left atrium is mildly dilated in size.    5. The right ventricle is normal in size, with normal systolic function.    6. There is no pulmonary hypertension, estimated pulmonary artery systolic  pressure is 33 mmHg.    Assessment and Plan   Palpitations - will obtain 14 day zio patch for further evaluation - EKG today shows NSR     Arnoldo Lenis, M.D.

## 2021-03-25 DIAGNOSIS — R000 Tachycardia, unspecified: Secondary | ICD-10-CM | POA: Diagnosis not present

## 2021-04-24 NOTE — Progress Notes (Signed)
Cardiology Office Note  Date: 04/25/2021   ID: Bonnie Brown, DOB 11/11/1980, MRN [id number]  PCP:  Lanelle Bal, PA-C  Cardiologist:  Carlyle Dolly, MD Electrophysiologist:  None   Chief Complaint: 6-week follow-up  History of Present Illness: Bonnie Brown is a 40 y.o. female with a history of HTN, hypothyroidism, palpitations.  Previous history of chest pain was evaluated in 2018 with negative stress echo.   She last saw Dr. Harl Bowie on 03/21/2021.  Blood pressure was elevated.  She had not taken her antihypertensive medication.  Blood pressures at home were in the 120s over 60s.  She had recent palpitations occurring a few times a week no specific trigger.  Had an episode at work with initially heart racing.  Heart rate was 150s.  She felt dizzy.  This lasted about 20 minutes.  She had an episode on vacation while walking upstairs and felt heart racing was some accompanying shortness of breath weakness in her legs.  Severe episodes that improved somewhat with lower thyroid duration.  She had weaned caffeine.  History of heart murmur with recent echo at [facility] with mild aortic valve thickening and hyperdynamic LV.  History of childhood cancer with chemo/radiation of the chest.  History of breast cancer.  She is here for follow-up after recent ZIO monitor.  ZIO monitor report as below.  She states her palpitations have pretty much resolved.  She believes the culprit for palpitations was the fact that she was on increased dosage of thyroid medication.  Previously on 112 mcg of levothyroxine.  She states since visit here her levothyroxine dose was decreased and palpitations have improved significantly.  Only occasional palpitations that are short-lived.  We discussed the results of ZIO monitor: She had a minimum heart rate of 55, max heart rate 167, average heart rate 95.  Predominant underlying rhythm was normal sinus with isolated supraventricular ectopy couplets, no triplets.   Isolated ventricular ectopy less than 1%, no couplets or triplets.   Past Medical History:  Diagnosis Date   Breast disorder    History of blood transfusion 1995   History of breast cancer 08/2015   History of chemotherapy age 73   History of Hodgkin's lymphoma age 69   Hypertension    under control with med., has been on med. since 04/2015   Hypothyroidism    Irregular heartbeat    states due to radiation treatments - no med., has not seen cardiology since 08/2013   Migraines    Vitamin D deficiency 06/20/2016   no current med.    Past Surgical History:  Procedure Laterality Date   BREAST LUMPECTOMY WITH RADIOACTIVE SEED LOCALIZATION Left 09/06/2015   Procedure: BREAST LUMPECTOMY WITH RADIOACTIVE SEED LOCALIZATION;  Surgeon: Fanny Skates, MD;  Location: [place];  Service: General;  Laterality: Left;   BREAST RECONSTRUCTION WITH PLACEMENT OF TISSUE EXPANDER AND FLEX HD (ACELLULAR HYDRATED DERMIS) Bilateral 12/12/2015   Procedure: BILATERAL BREAST RECONSTRUCTION WITH PLACEMENT OF TISSUE EXPANDERS AND FLEX HD (ACELLULAR HYDRATED DERMIS);  Surgeon: Loel Lofty Dillingham, DO;  Location: [place];  Service: Plastics;  Laterality: Bilateral;   CESAREAN SECTION N/A 11/04/2012   Procedure: Primary cesarean section with delivery of baby boy at 2007. apgars 8/9.;  Surgeon: Osborne Oman, MD;  Location: [facility];  Service: Obstetrics;  Laterality: N/A;   CESAREAN SECTION N/A 07/30/2014   Procedure: REPEAT CESAREAN SECTION;  Surgeon: Jonnie Kind, MD;  Location: [facility];  Service: Obstetrics;  Laterality: N/A;  LIPOSUCTION Left 03/29/2016   Procedure: LIPOSUCTION;  Surgeon: Wallace Going, DO;  Location: [place];  Service: Plastics;  Laterality: Left;   LIPOSUCTION WITH LIPOFILLING Bilateral 07/05/2016   Procedure: LIPOSUCTION WITH LIPOFILLING FROM ABDOMEN TO BILATERAL BREAST;  Surgeon: Wallace Going, DO;  Location: [place];  Service:  Plastics;  Laterality: Bilateral;   LYMPH NODE BIOPSY  1992   MASTECTOMY W/ SENTINEL NODE BIOPSY Bilateral 12/12/2015   Procedure: LEFT TOTAL MASTECTOMY WITH LEFT AXILLARY SENTINEL LYMPH NODE BIOPSY AND RIGHT PROPHYLACTIC MASTECTOMY;  Surgeon: Fanny Skates, MD;  Location: [place];  Service: General;  Laterality: Bilateral;   REMOVAL OF BILATERAL TISSUE EXPANDERS WITH PLACEMENT OF BILATERAL BREAST IMPLANTS Bilateral 03/29/2016   Procedure: REMOVAL OF BILATERAL TISSUE EXPANDERS WITH PLACEMENT OF BILATERAL SILCONE BREAST IMPLANTS;  Surgeon: Wallace Going, DO;  Location: [place];  Service: Plastics;  Laterality: Bilateral;   TOTAL THYROIDECTOMY  05/16/2015   WISDOM TOOTH EXTRACTION      Current Outpatient Medications  Medication Sig Dispense Refill   doxycycline (MONODOX) 50 MG capsule Take 50 mg by mouth daily.     levothyroxine (SYNTHROID) 100 MCG tablet Take 100 mcg by mouth every morning.     losartan-hydrochlorothiazide (HYZAAR) 50-12.5 MG tablet Take 1 tablet by mouth daily.     No current facility-administered medications for this visit.   Allergies:  Seldane [terfenadine], Vancomycin, Adhesive [tape], Codeine, Penicillins, and Peridex [chlorhexidine gluconate]   Social History: The patient  reports that she has never smoked. She has never used smokeless tobacco. She reports that she does not drink alcohol and does not use drugs.   Family History: The patient's family history includes Colon cancer in her paternal aunt; Coronary artery disease in her father, maternal grandmother, and paternal grandfather; Diabetes in her maternal grandmother; Hypertension in her brother and mother; Pancreatic cancer in her maternal grandmother; Stroke in her maternal grandmother.   ROS:  Please see the history of present illness. Otherwise, complete review of systems is positive for none.  All other systems are reviewed and negative.   Physical Exam: VS:  BP 134/88   Pulse 96   Ht  5\' 3"  (1.6 m)   Wt 192 lb 9.6 oz (87.4 kg)   SpO2 99%   BMI 34.12 kg/m , BMI Body mass index is 34.12 kg/m.  Wt Readings from Last 3 Encounters:  04/25/21 192 lb 9.6 oz (87.4 kg)  03/21/21 190 lb 9.6 oz (86.5 kg)  11/02/20 199 lb 8 oz (90.5 kg)    General: Patient appears comfortable at rest. Neck: Supple, no elevated JVP or carotid bruits, no thyromegaly. Lungs: Clear to auscultation, nonlabored breathing at rest. Cardiac: Regular rate and rhythm, no S3 or significant systolic murmur, no pericardial rub. Extremities: No pitting edema, distal pulses 2+. Skin: Warm and dry. Musculoskeletal: No kyphosis. Neuropsychiatric: Alert and oriented x3, affect grossly appropriate.  ECG:    Recent Labwork: No results found for requested labs within last 8760 hours.     Component Value Date/Time   CHOL 236 (H) 08/12/2018 1426   TRIG 140 08/12/2018 1426   HDL 53 08/12/2018 1426   CHOLHDL 4.5 (H) 08/12/2018 1426   LDLCALC 155 (H) 08/12/2018 1426    Other Studies Reviewed Today:    14-day ZIO monitor 03/21/2021 Patch Wear Time:  13 days and 18 hours (2022-09-17T14:22:35-0400 to 2022-10-01T09:18:29-0400) Patient had a min HR of 55 bpm, max HR of 167 bpm, and avg HR of  95 bpm. Predominant underlying rhythm was Sinus Rhythm. Isolated SVEs were rare (<1.0%), SVE Couplets were rare (<1.0%), and no SVE Triplets were present. Isolated VEs were rare (<1.0%), and no VE Couplets or VE Triplets were present.    10/2016 stress echo Study Conclusions   - Stress ECG conclusions: The stress ECG was normal. [facility] scoring:    exercise time of 10.5 min; maximum ST deviation of 0 mm; no    angina; resulting score is 11. This score predicts a low risk of    cardiac events.  - Staged echo: Resting LV systolic function was normal, LVEF    60-65%. With exercise, there was normal augmentation of all wall    segments with a hypercontractile response seen, LVEF 80%. No    stress induced ischemia. Normal  echo stress   Impressions:   - Normal study after maximal exercise.      02/2021 echo Summary    1. Technically difficult study.    2. The left ventricle is normal in size with normal wall thickness.    3. The left ventricular systolic function is normal, LVEF is visually  estimated at 65-70%.    4. The left atrium is mildly dilated in size.    5. The right ventricle is normal in size, with normal systolic function.    6. There is no pulmonary hypertension, estimated pulmonary artery systolic  pressure is 33 mmHg.    Assessment and Plan:  1. Palpitations   Here for follow-up of palpitations and ZIO monitor.  Patient states in the interim since last visit here her palpitations have decreased significantly.  Levothyroxine was decreased to 100 mcg in the interim since last visit.  She states she still has occasional palpitations but have been significantly reduced since thyroid medication dosage was reduced.  We reviewed the results of ZIO monitor. She had a minimum heart rate of 55, max heart rate 167, average heart rate 95.  Predominant underlying rhythm was sinus with isolated supraventricular ectopy couplets, no triplets.  Isolated ventricular ectopy less than 1%, no couplets or triplets.   Medication Adjustments/Labs and Tests Ordered: Current medicines are reviewed at length with the patient today.  Concerns regarding medicines are outlined above.   Disposition: Follow-up with Dr. Harl Bowie as needed  Signed, Levell July, NP 04/25/2021 4:40 PM    [facility] [place], [place], [place] [address] Phone: [phone]; Fax: [phone]

## 2021-04-25 ENCOUNTER — Ambulatory Visit (INDEPENDENT_AMBULATORY_CARE_PROVIDER_SITE_OTHER): Payer: BC Managed Care – PPO | Admitting: Family Medicine

## 2021-04-25 ENCOUNTER — Encounter: Payer: Self-pay | Admitting: Family Medicine

## 2021-04-25 VITALS — BP 134/88 | HR 96 | Ht 63.0 in | Wt 192.6 lb

## 2021-04-25 DIAGNOSIS — R002 Palpitations: Secondary | ICD-10-CM

## 2021-04-25 NOTE — Patient Instructions (Addendum)
Medication Instructions:   Your physician recommends that you continue on your current medications as directed. Please refer to the Current Medication list given to you today.  Labwork:  none  Testing/Procedures:  none  Follow-Up:  Your physician recommends that you schedule a follow-up appointment in: as needed.  Any Other Special Instructions Will Be Listed Below (If Applicable).  If you need a refill on your cardiac medications before your next appointment, please call your pharmacy. 

## 2021-08-21 IMAGING — DX DG CHEST 1V PORT
1 series · 1 of 1 positions shown · non-contrast
Comparison: None.

CLINICAL DATA: Chest pain.  COVID positive.

EXAM:
PORTABLE CHEST 1 VIEW

[chest ap]
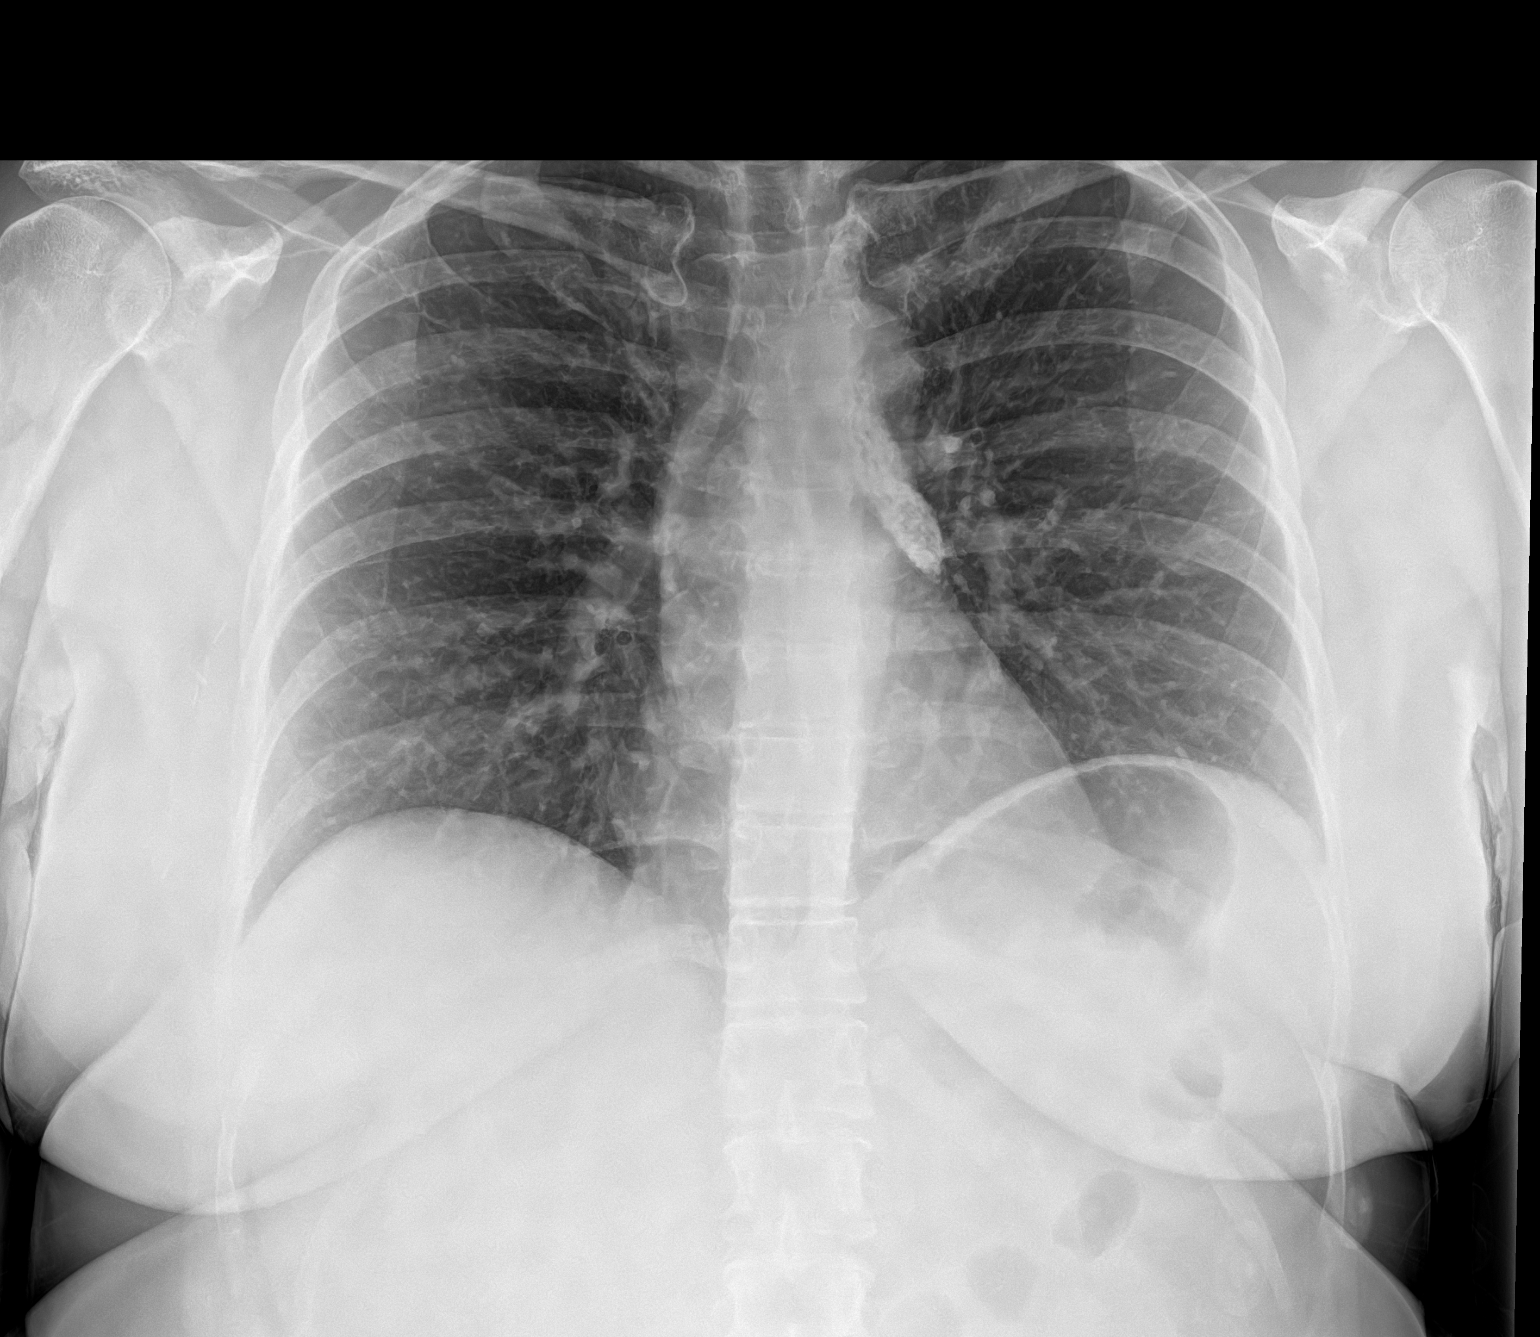

[1 of 1 positions shown; findings below may reference images not displayed]

FINDINGS: The cardiac silhouette, mediastinal and hilar contours are within
normal limits. Calcified lymph nodes noted in the AP window region.

Vague density in the right upper lobe could reflect infiltrate. No
other definite infiltrates and no effusions or edema. The bony
thorax is intact.
IMPRESSION: Possible right upper lobe infiltrate.

Calcified left AP window nodes.

## 2021-08-21 IMAGING — US US EXTREM LOW VENOUS
1 series · 13 of 24 positions shown · non-contrast
Comparison: None.

CLINICAL DATA: Bilateral lower extremity pain for 1 week



[Series 1: us venous img lower bilat (dvt) · portal-venous · 13 of 63 slices shown]
[im 1/63]
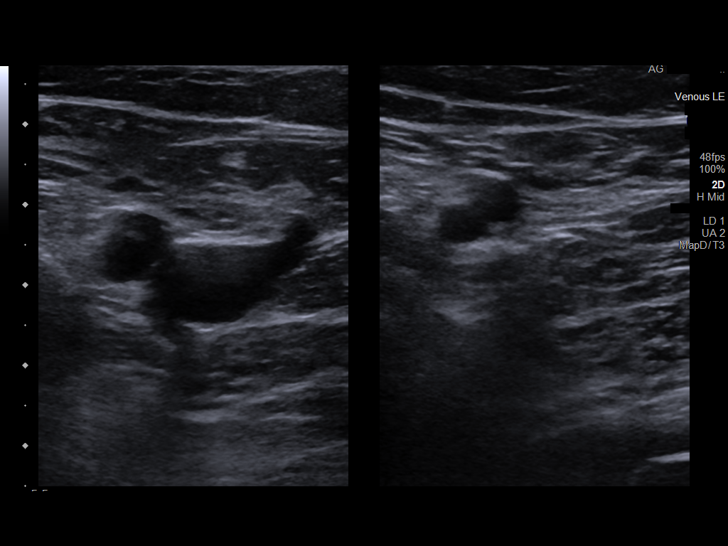
[im 6/63]
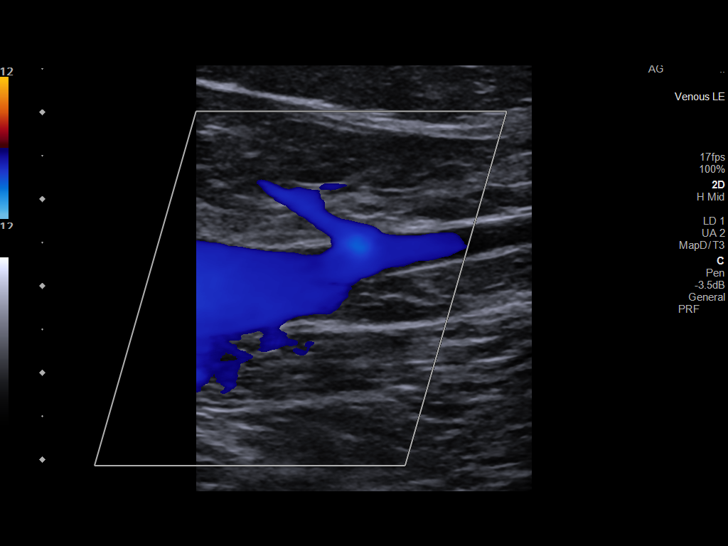
[im 11/63]
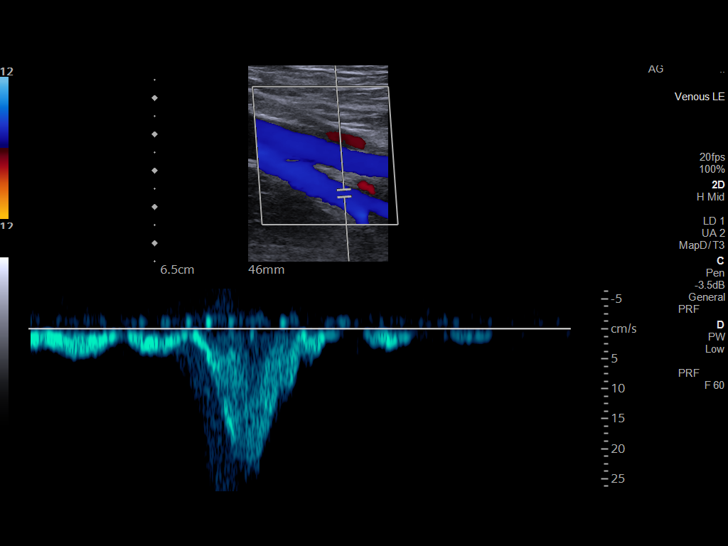
[im 17/63]
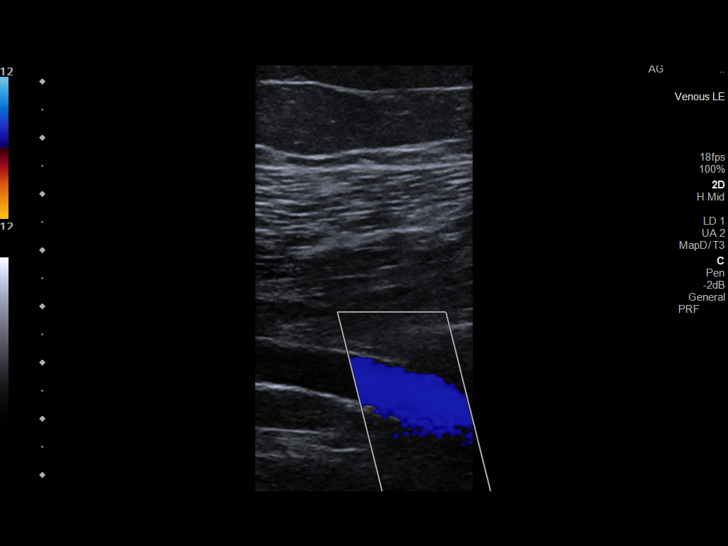
[im 22/63]
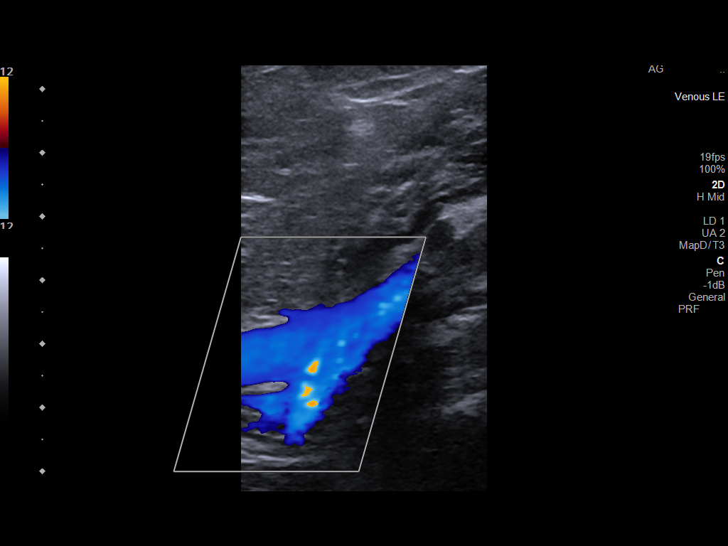
[im 27/63]
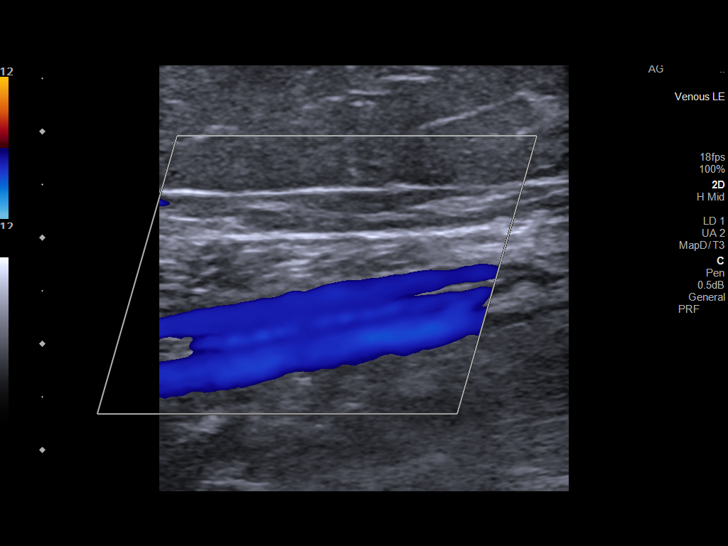
[im 33/63]
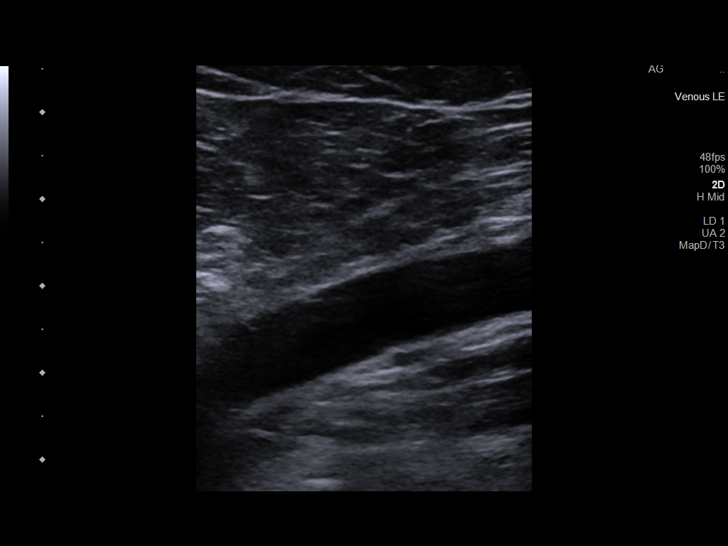
[im 36/63]
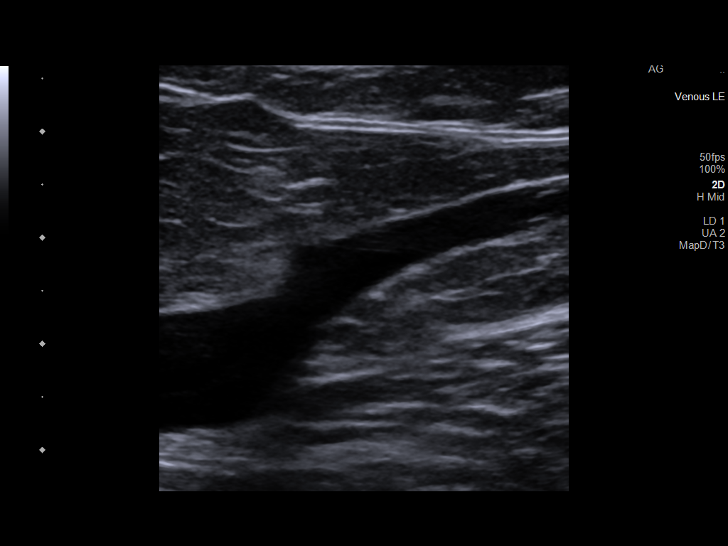
[im 41/63]
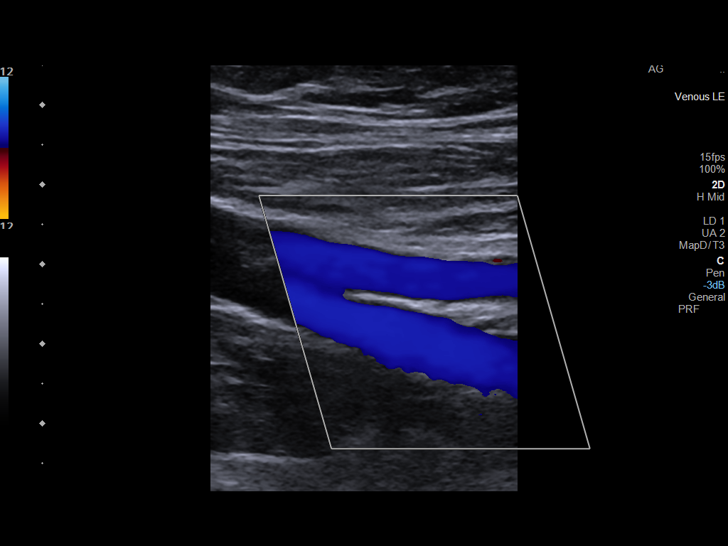
[im 46/63]
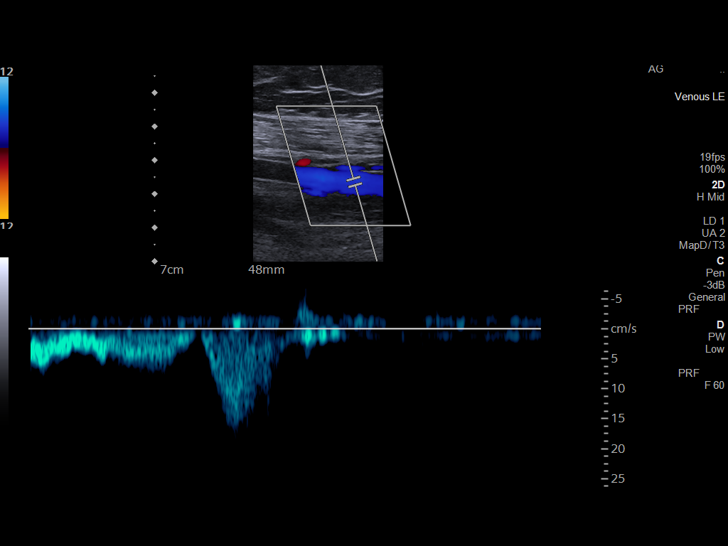
[im 52/63]
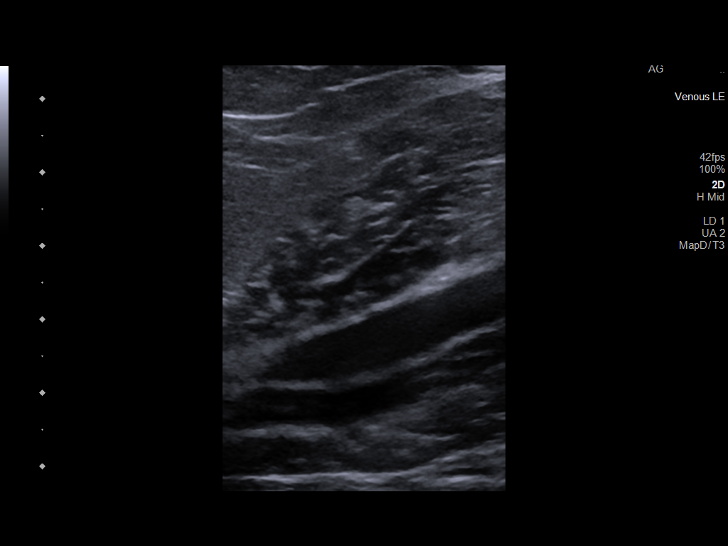
[im 57/63]
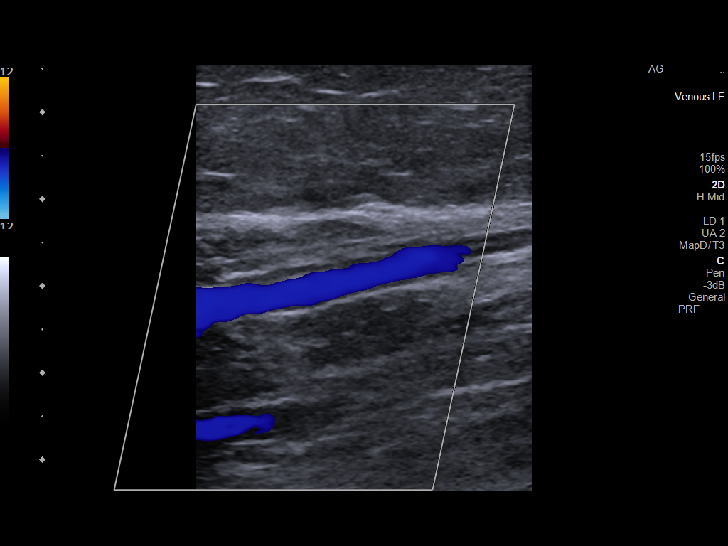
[im 63/63]
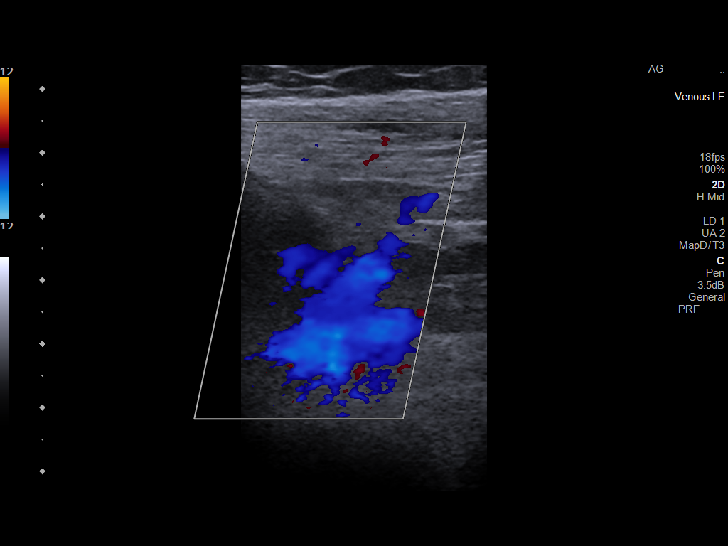

[13 of 24 positions shown; findings below may reference images not displayed]

FINDINGS: RIGHT LOWER EXTREMITY

Common Femoral Vein: No evidence of thrombus. Normal
compressibility, respiratory phasicity and response to augmentation.

Saphenofemoral Junction: No evidence of thrombus. Normal
compressibility and flow on color Doppler imaging.

Profunda Femoral Vein: No evidence of thrombus. Normal
compressibility and flow on color Doppler imaging.

Femoral Vein: No evidence of thrombus. Normal compressibility,
respiratory phasicity and response to augmentation.

Popliteal Vein: No evidence of thrombus. Normal compressibility,
respiratory phasicity and response to augmentation.

Calf Veins: No evidence of thrombus. Normal compressibility and flow
on color Doppler imaging.

Superficial Great Saphenous Vein: No evidence of thrombus. Normal
compressibility.

Venous Reflux:  None.

Other Findings:  None.

LEFT LOWER EXTREMITY

Common Femoral Vein: No evidence of thrombus. Normal
compressibility, respiratory phasicity and response to augmentation.

Saphenofemoral Junction: No evidence of thrombus. Normal
compressibility and flow on color Doppler imaging.

Profunda Femoral Vein: No evidence of thrombus. Normal
compressibility and flow on color Doppler imaging.

Femoral Vein: No evidence of thrombus. Normal compressibility,
respiratory phasicity and response to augmentation.

Popliteal Vein: No evidence of thrombus. Normal compressibility,
respiratory phasicity and response to augmentation.

Calf Veins: No evidence of thrombus. Normal compressibility and flow
on color Doppler imaging.

Superficial Great Saphenous Vein: No evidence of thrombus. Normal
compressibility.

Venous Reflux:  None.

Other Findings:  None.
IMPRESSION: No evidence of deep venous thrombosis in either lower extremity.

## 2021-11-23 ENCOUNTER — Ambulatory Visit (INDEPENDENT_AMBULATORY_CARE_PROVIDER_SITE_OTHER): Payer: BC Managed Care – PPO | Admitting: Adult Health

## 2021-11-23 ENCOUNTER — Other Ambulatory Visit (HOSPITAL_COMMUNITY)
Admission: RE | Admit: 2021-11-23 | Discharge: 2021-11-23 | Disposition: A | Discharge status: Home / Self Care | Payer: BC Managed Care – PPO | Source: Ambulatory Visit | Attending: Adult Health | Admitting: Adult Health

## 2021-11-23 ENCOUNTER — Encounter: Payer: Self-pay | Admitting: Adult Health

## 2021-11-23 VITALS — BP 164/99 | HR 87 | Ht 63.0 in | Wt 190.5 lb

## 2021-11-23 DIAGNOSIS — Z1211 Encounter for screening for malignant neoplasm of colon: Secondary | ICD-10-CM

## 2021-11-23 DIAGNOSIS — Z01419 Encounter for gynecological examination (general) (routine) without abnormal findings: Secondary | ICD-10-CM

## 2021-11-23 DIAGNOSIS — Z8571 Personal history of Hodgkin lymphoma: Secondary | ICD-10-CM

## 2021-11-23 DIAGNOSIS — N926 Irregular menstruation, unspecified: Secondary | ICD-10-CM | POA: Diagnosis not present

## 2021-11-23 DIAGNOSIS — I10 Essential (primary) hypertension: Secondary | ICD-10-CM | POA: Diagnosis not present

## 2021-11-23 DIAGNOSIS — Z3202 Encounter for pregnancy test, result negative: Secondary | ICD-10-CM | POA: Diagnosis not present

## 2021-11-23 DIAGNOSIS — Z853 Personal history of malignant neoplasm of breast: Secondary | ICD-10-CM

## 2021-11-23 LAB — POCT URINE PREGNANCY: Preg Test, Ur: NEGATIVE

## 2021-11-23 LAB — HEMOCCULT GUIAC POC 1CARD (OFFICE): Fecal Occult Blood, POC: NEGATIVE

## 2021-11-23 NOTE — Progress Notes (Signed)
Patient ID: Bonnie Brown, female   DOB: 02-26-1981, 41 y.o.   MRN: [id number] History of Present Illness: Bonnie Brown is a 41 year old white female,married, G3P2012 in for a well woman gyn exam and requests pap. Her periods are irregular, but no hot flashes now. She is working at [employer].   Lab Results  Component Value Date   DIAGPAP  11/02/2019    - Negative for intraepithelial lesion or malignancy (NILM)   HPV NOT DETECTED 08/12/2018   [place] Negative 11/02/2019   PCP is Lanelle Bal PA.  Current Medications, Allergies, Past Medical History, Past Surgical History, Family History and Social History were reviewed in [employer] record.     Review of Systems: Patient denies any headaches, hearing loss, fatigue, blurred vision, shortness of breath, chest pain, abdominal pain, problems with bowel movements, urination, or intercourse. No joint pain or mood swings.  +irregular periods    Physical Exam:BP (!) 164/99 (BP Location: Right Arm, Cuff Size: Normal)   Pulse 87   Ht '5\' 3"'$  (1.6 m)   Wt 190 lb 8 oz (86.4 kg)   LMP 10/18/2021 (Approximate)   BMI 33.75 kg/m  UPT is negative  General:  Well developed, well nourished, no acute distress Skin:  Warm and dry Neck:  Midline trachea, normal thyroid, good ROM, no lymphadenopathy Lungs; Clear to auscultation bilaterally Breast: SP bilateral mastectomy for left breast cancer +ER/PR has had reconstruction Cardiovascular: Regular rate and rhythm Abdomen:  Soft, non tender, no hepatosplenomegaly Pelvic:  External genitalia is normal in appearance, no lesions.  The vagina is normal in appearance. Urethra has no lesions or masses. The cervix is smooth, pap with HR HPV genotyping performed.  Uterus is felt to be normal size, shape, and contour.  No adnexal masses or tenderness noted.Bladder is non tender, no masses felt. Rectal: Good sphincter tone, no polyps, or hemorrhoids felt.  Hemoccult  negative. Extremities/musculoskeletal:  No swelling or varicosities noted, no clubbing or cyanosis Psych:  No mood changes, alert and cooperative,seems happy AA is 1 Fall risk is low    11/23/2021    8:39 AM 11/02/2020    8:34 AM 11/02/2019    8:42 AM  Depression screen PHQ 2/9  Decreased Interest 0 0 0  Down, Depressed, Hopeless 0 0 0  PHQ - 2 Score 0 0 0  Altered sleeping 0 1 0  Tired, decreased energy 1 1 0  Change in appetite 0 0 0  Feeling bad or failure about yourself  0 0 0  Trouble concentrating 0 0 0  Moving slowly or fidgety/restless 0 0 0  Suicidal thoughts 0 0 0  PHQ-9 Score 1 2 0       11/23/2021    8:39 AM 11/02/2020    8:34 AM 11/02/2019    8:43 AM  GAD 7 : Generalized Anxiety Score  Nervous, Anxious, on Edge 0 0 0  Control/stop worrying 0 0 0  Worry too much - different things 0 0 0  Trouble relaxing 0 0 0  Restless 0 0 0  Easily annoyed or irritable 1 0 0  Afraid - awful might happen 0 0 0  Total GAD 7 Score 1 0 0      Upstream - 11/23/21 0847       Pregnancy Intention Screening   Does the patient want to become pregnant in the next year? No    Does the patient's partner want to become pregnant in the next year? No  Would the patient like to discuss contraceptive options today? No      Contraception Wrap Up   Current Method No Method - Other Reason    End Method No Method - Other Reason            Examination chaperoned by Levy Pupa LPN  Impression and Plan: 1. Pregnancy examination or test, negative result - POCT urine pregnancy  2. Encounter for gynecological examination with Papanicolaou smear of cervix Pap sent Physical in 1 year Pap in 3 if normal Labs with PCP - Cytology - PAP( [place])  3. Irregular periods Will watch for now  4. Hypertension, unspecified type Take meds and keep check on BP  Follow up with PCP  5. Encounter for screening fecal occult blood testing Hemoccult negative   6. History of breast  cancer  7. History of Hodgkin's disease

## 2021-11-24 LAB — CYTOLOGY - PAP
Adequacy: ABSENT
Comment: NEGATIVE
Diagnosis: NEGATIVE
High risk HPV: NEGATIVE

## 2022-09-12 ENCOUNTER — Encounter: Payer: Self-pay | Admitting: Adult Health

## 2022-09-12 ENCOUNTER — Ambulatory Visit (INDEPENDENT_AMBULATORY_CARE_PROVIDER_SITE_OTHER): Payer: BC Managed Care – PPO | Admitting: Adult Health

## 2022-09-12 VITALS — BP 136/88 | HR 95 | Ht 63.0 in | Wt 193.5 lb

## 2022-09-12 DIAGNOSIS — R7989 Other specified abnormal findings of blood chemistry: Secondary | ICD-10-CM

## 2022-09-12 DIAGNOSIS — R9389 Abnormal findings on diagnostic imaging of other specified body structures: Secondary | ICD-10-CM

## 2022-09-12 DIAGNOSIS — R102 Pelvic and perineal pain unspecified side: Secondary | ICD-10-CM | POA: Insufficient documentation

## 2022-09-12 DIAGNOSIS — N926 Irregular menstruation, unspecified: Secondary | ICD-10-CM

## 2022-09-12 NOTE — Progress Notes (Signed)
  Subjective:     Patient ID: Bonnie Brown, female   DOB: Dec 06, 1980, 42 y.o.   MRN: [id number]  HPI Bonnie Brown is a 42 year old white female, married, [id number] in to discuss not having period since October and pressure low abdomen and low back pain and RLQ pain. She had [place] at work 09/06/22 that showed normal ovaries and endometrium of 12 mm. She also had labs on 07/27/22 FSH was 70.6, LH 37.2,TSH 1.645. Talk only.  She has history of Hodgkin's Disease and history of breast cancer.   Last pap was negative HPV,NILM 11/23/21.  PCP is Lanelle Bal PA.  Review of Systems No period since October Low abd. Pressure Low back pain and RLQ pain  Reviewed past medical,surgical, social and family history. Reviewed medications and allergies.     Objective:   Physical Exam BP 136/88 (BP Location: Right Arm, Patient Position: Sitting, Cuff Size: Normal)   Pulse 95   Ht '5\' 3"'$  (1.6 m)   Wt 193 lb 8 oz (87.8 kg)   BMI 34.28 kg/m  Skin warm and dry.  Lungs: clear to ausculation bilaterally. Cardiovascular: regular rate and rhythm.  Reviewed [place] and labs with her. Fall risk is low  Upstream - 09/12/22 1401       Pregnancy Intention Screening   Does the patient want to become pregnant in the next year? No    Does the patient's partner want to become pregnant in the next year? No    Would the patient like to discuss contraceptive options today? No      Contraception Wrap Up   Current Method No Method - Other Reason    End Method No Method - Other Reason    Contraception Counseling Provided No                Assessment:     1. Missed periods No period since October   2. Increased follicle stimulating hormone [facility]) level FSH was 70.6 in January   3. Thickened endometrium Endometrium 12 mm on [place]   4. Pelvic pressure in female     Plan:     Return 10/02/22 for endometrial biopsy with Dr Nelda Marseille

## 2022-10-02 ENCOUNTER — Encounter: Payer: Self-pay | Admitting: Obstetrics & Gynecology

## 2022-10-02 ENCOUNTER — Ambulatory Visit (INDEPENDENT_AMBULATORY_CARE_PROVIDER_SITE_OTHER): Payer: BC Managed Care – PPO | Admitting: Obstetrics & Gynecology

## 2022-10-02 ENCOUNTER — Other Ambulatory Visit (HOSPITAL_COMMUNITY)
Admission: RE | Admit: 2022-10-02 | Discharge: 2022-10-02 | Disposition: A | Discharge status: Home / Self Care | Payer: BC Managed Care – PPO | Source: Ambulatory Visit | Attending: Obstetrics & Gynecology | Admitting: Obstetrics & Gynecology

## 2022-10-02 VITALS — BP 133/80 | HR 92 | Ht 63.0 in | Wt 192.4 lb

## 2022-10-02 DIAGNOSIS — N951 Menopausal and female climacteric states: Secondary | ICD-10-CM

## 2022-10-02 DIAGNOSIS — R9389 Abnormal findings on diagnostic imaging of other specified body structures: Secondary | ICD-10-CM | POA: Insufficient documentation

## 2022-10-02 LAB — POCT URINE PREGNANCY: Preg Test, Ur: NEGATIVE

## 2022-10-02 NOTE — Progress Notes (Signed)
GYN VISIT Patient name: Bonnie Brown MRN [id number]  Date of birth: 05-19-1981 Chief Complaint:   Procedure (Endo bx)  History of Present Illness:   Bonnie Brown is a 42 y.o. [phone] perimenopausal female being seen today for the following concerns;  .     October 2023 last period- prior to that period were every 5-6 mos.  Then after seeing J.Griffin- had bleeding for a few days had bleeding when she wiped.    Mostly right-sided pain, some days pain 10.  Also notes constipation- sometimes every 3 days.  Recent US 09/06/22: 9x6x5cm.  Endometrium 82mm.  Normal ovaries bilaterally  No LMP recorded. (Menstrual status: Irregular Periods).     11/23/2021    8:39 AM 11/02/2020    8:34 AM 11/02/2019    8:42 AM 08/12/2018    1:48 PM 06/21/2017    8:56 AM  Depression screen PHQ 2/9  Decreased Interest 0 0 0 0 0  Down, Depressed, Hopeless 0 0 0 0 0  PHQ - 2 Score 0 0 0 0 0  Altered sleeping 0 1 0    Tired, decreased energy 1 1 0    Change in appetite 0 0 0    Feeling bad or failure about yourself  0 0 0    Trouble concentrating 0 0 0    Moving slowly or fidgety/restless 0 0 0    Suicidal thoughts 0 0 0    PHQ-9 Score 1 2 0       Review of Systems:   Pertinent items are noted in HPI Denies fever/chills, dizziness, headaches, visual disturbances, fatigue, shortness of breath, chest pain, abdominal pain, vomiting, no problems with bowel movements, urination, or intercourse unless otherwise stated above.  Pertinent History Reviewed:  Reviewed past medical,surgical, social, obstetrical and family history.  Reviewed problem list, medications and allergies. Physical Assessment:   Vitals:   10/02/22 1326 10/02/22 1352  BP: (!) 150/90 133/80  Pulse: (!) 112 92  Weight: 192 lb 6.4 oz (87.3 kg)   Height: 5\' 3"  (1.6 m)   Body mass index is 34.08 kg/m.       Physical Examination:   General appearance: alert, well appearing, and in no distress  Psych: mood appropriate, normal  affect  Skin: warm & dry   Cardiovascular: normal heart rate noted  Respiratory: normal respiratory effort, no distress  Abdomen: soft, non-tender   Pelvic: VULVA: normal appearing vulva with no masses, tenderness or lesions, VAGINA: normal appearing vagina with normal color and discharge, no lesions, CERVIX: normal appearing cervix without discharge or lesions, UTERUS: uterus is normal size, shape, consistency and nontender  Extremities: no edema   Chaperone:  Dr. Heber [place]peka     Endometrial Biopsy Procedure Note  Pre-operative Diagnosis: thickened endometrium  Post-operative Diagnosis: same  Procedure Details   Urine pregnancy test was negative.  The risks (including infection, bleeding, pain, and uterine perforation) and benefits of the procedure were explained to the patient and Written informed consent was obtained.  Antibiotic prophylaxis against endocarditis was not indicated.   The patient was placed in the dorsal lithotomy position.  Bimanual exam showed the uterus to be in the neutral position.  A speculum inserted in the vagina, and the cervix prepped with betadine.     A single tooth tenaculum was applied to the anterior lip of the cervix for stabilization.  A sterile uterine sound was used to sound the uterus to a depth of 8cm.  A Pipelle endometrial  aspirator was used to sample the endometrium.  Sample was sent for pathologic examination.  Condition: Stable  Complications: None   Assessment & Plan:  1) Thickened endometrium, perimenopausal status -reviewed US findings -discussed plan for EMB today- see above -further management pending results of path  The patient was advised to call for any fever or for prolonged or severe pain or bleeding. She was advised to use OTC analgesics as needed for mild to moderate pain. She was advised to avoid vaginal intercourse for 48 hours or until the bleeding has completely stopped.   Orders Placed This Encounter  Procedures    POCT urine pregnancy    Return in about 1 year (around 10/02/2023) for Annual.   Janyth Pupa, DO Attending [place], [place] for [facility], [place]

## 2022-10-04 LAB — SURGICAL PATHOLOGY

## 2023-05-24 ENCOUNTER — Ambulatory Visit (INDEPENDENT_AMBULATORY_CARE_PROVIDER_SITE_OTHER): Payer: BC Managed Care – PPO | Admitting: Internal Medicine

## 2023-05-24 ENCOUNTER — Encounter: Payer: Self-pay | Admitting: Internal Medicine

## 2023-05-24 VITALS — BP 128/82 | HR 76 | Ht 63.0 in | Wt 194.0 lb

## 2023-05-24 DIAGNOSIS — N911 Secondary amenorrhea: Secondary | ICD-10-CM | POA: Insufficient documentation

## 2023-05-24 DIAGNOSIS — E890 Postprocedural hypothyroidism: Secondary | ICD-10-CM | POA: Diagnosis not present

## 2023-05-24 DIAGNOSIS — R7303 Prediabetes: Secondary | ICD-10-CM | POA: Diagnosis not present

## 2023-05-24 LAB — TSH: TSH: 0.99 u[IU]/mL (ref 0.35–5.50)

## 2023-05-24 LAB — T4, FREE: Free T4: 1.25 ng/dL (ref 0.60–1.60)

## 2023-05-24 LAB — LIPID PANEL
Cholesterol: 204 mg/dL — ABNORMAL HIGH (ref 0–200)
HDL: 42.3 mg/dL (ref >39.00)
LDL Cholesterol: 130 mg/dL — ABNORMAL HIGH (ref 0–99)
NonHDL: 161.27
Total CHOL/HDL Ratio: 5
Triglycerides: 155 mg/dL — ABNORMAL HIGH (ref 0.0–149.0)
VLDL: 31 mg/dL (ref 0.0–40.0)

## 2023-05-24 LAB — FOLLICLE STIMULATING HORMONE: FSH: 60.7 m[IU]/mL

## 2023-05-24 LAB — COMPREHENSIVE METABOLIC PANEL
ALT: 12 U/L (ref 0–35)
AST: 14 U/L (ref 0–37)
Albumin: 4.4 g/dL (ref 3.5–5.2)
Alkaline Phosphatase: 71 U/L (ref 39–117)
BUN: 19 mg/dL (ref 6–23)
CO2: 26 meq/L (ref 19–32)
Calcium: 8.3 mg/dL — ABNORMAL LOW (ref 8.4–10.5)
Chloride: 103 meq/L (ref 96–112)
Creatinine, Ser: 0.79 mg/dL (ref 0.40–1.20)
GFR: 92.31 mL/min (ref >60.00)
Glucose, Bld: 84 mg/dL (ref 70–99)
Potassium: 3.7 meq/L (ref 3.5–5.1)
Sodium: 140 meq/L (ref 135–145)
Total Bilirubin: 0.3 mg/dL (ref 0.2–1.2)
Total Protein: 7.5 g/dL (ref 6.0–8.3)

## 2023-05-24 LAB — CORTISOL: Cortisol, Plasma: 5 ug/dL

## 2023-05-24 LAB — LUTEINIZING HORMONE: LH: 30.57 m[IU]/mL

## 2023-05-24 LAB — HEMOGLOBIN A1C: Hgb A1c MFr Bld: 5.7 % (ref 4.6–6.5)

## 2023-05-24 NOTE — Progress Notes (Unsigned)
Name: Bonnie Brown  MRN/ DOB: [id number], October 25, 1980    Age/ Sex: 42 y.o., female    PCP: Lianne Moris, PA-C   Reason for Endocrinology Evaluation: Hypothyroidism     Date of Initial Endocrinology Evaluation: 05/24/2023     HPI: Bonnie Brown is a 42 y.o. female with a past medical history of HTN, hypothyroid , pre diabetes and dyslipidemia, Hx of breast Ca ( B/L mastectomy 2017), s/p radiation  to the chest as child due to hodgkin lymphoma  (1996) . The patient presented for initial endocrinology clinic visit on 05/24/2023 for consultative assistance with her Hypothyroidism.   She was diagnosed with hypothyroidism in 2010.  S/P total thyroidectomy in 2016 secondary do MNG , benign pathology   Denies local neck swelling  Has occasional palpitations  Has hx of HTN a couple of years ago, saw cardiology at the time which was attributed to thyroid disease  NO MVI  No Biotin   Patient is under the care of of gynecology for early menopause. LMP 12/2022   Menarche 16  Has hx of regular menstruations until  fall 2022 when it became asymptomatic   She has a history of prediabetes with an A1c of 5.7% in 2020 Pt eats 3 meals a day, and 2 snacks ,sees a  for the past 1.5 yrs  Does not exercise  Avoids sugar sweetened beverages  Zepbound has been prescribed by PCP but it has become cost-prohibitive   Levothyroxine 112 mcg daily  No Fh of thyroid disease  NO fh of ovarian or breast  No Fh of clotting disorder    HISTORY:  Past Medical History:  Past Medical History:  Diagnosis Date   Breast disorder    History of blood transfusion 1995   History of breast cancer 08/2015   History of chemotherapy age 54   History of Hodgkin's lymphoma age 49   Hypertension    under control with med., has been on med. since 04/2015   Hypothyroidism    Irregular heartbeat    states due to radiation treatments - no med., has not seen cardiology since 08/2013   Migraines     Vitamin D deficiency 06/20/2016   no current med.   Past Surgical History:  Past Surgical History:  Procedure Laterality Date   BREAST LUMPECTOMY WITH RADIOACTIVE SEED LOCALIZATION Left 09/06/2015   Procedure: BREAST LUMPECTOMY WITH RADIOACTIVE SEED LOCALIZATION;  Surgeon: Claud Kelp, MD;  Location: [facility];  Service: General;  Laterality: Left;   BREAST RECONSTRUCTION WITH PLACEMENT OF TISSUE EXPANDER AND FLEX HD (ACELLULAR HYDRATED DERMIS) Bilateral 12/12/2015   Procedure: BILATERAL BREAST RECONSTRUCTION WITH PLACEMENT OF TISSUE EXPANDERS AND FLEX HD (ACELLULAR HYDRATED DERMIS);  Surgeon: Alena Bills Dillingham, DO;  [facility];  Service: Plastics;  Laterality: Bilateral;   CESAREAN SECTION N/A 11/04/2012   Procedure: Primary cesarean section with delivery of baby boy at 2007. apgars 8/9.;  Surgeon: Tereso Newcomer, MD;  Location: [facility];  Service: Obstetrics;  Laterality: N/A;   CESAREAN SECTION N/A 07/30/2014   Procedure: REPEAT CESAREAN SECTION;  Surgeon: Tilda Burrow, MD;  Location: [facility];  Service: Obstetrics;  Laterality: N/A;   LIPOSUCTION Left 03/29/2016   Procedure: LIPOSUCTION;  Surgeon: Peggye Form, DO;  Location: [facility];  Service: Plastics;  Laterality: Left;   LIPOSUCTION WITH LIPOFILLING Bilateral 07/05/2016   Procedure: LIPOSUCTION WITH LIPOFILLING FROM ABDOMEN TO BILATERAL BREAST;  Surgeon: Alena Bills Dillingham, DO;  Location: [facility];  Service: Plastics;  Laterality: Bilateral;   LYMPH NODE BIOPSY  1992   MASTECTOMY W/ SENTINEL NODE BIOPSY Bilateral 12/12/2015   Procedure: LEFT TOTAL MASTECTOMY WITH LEFT AXILLARY SENTINEL LYMPH NODE BIOPSY AND RIGHT PROPHYLACTIC MASTECTOMY;  Surgeon: Claud Kelp, MD;  Location[facility];  Service: General;  Laterality: Bilateral;   REMOVAL OF BILATERAL TISSUE EXPANDERS WITH PLACEMENT OF BILATERAL BREAST IMPLANTS Bilateral 03/29/2016   Procedure: REMOVAL OF BILATERAL TISSUE  EXPANDERS WITH PLACEMENT OF BILATERAL SILCONE BREAST IMPLANTS;  Surgeon: Peggye Form, DO;  Location: [facility];  Service: Plastics;  Laterality: Bilateral;   TOTAL THYROIDECTOMY  05/16/2015   WISDOM TOOTH EXTRACTION      Social History:  reports that she has never smoked. She has never used smokeless tobacco. She reports that she does not drink alcohol and does not use drugs. Family History: family history includes Colon cancer in her paternal aunt; Coronary artery disease in her father, maternal grandmother, and paternal grandfather; Diabetes in her maternal grandmother; Hypertension in her brother and mother; Pancreatic cancer in her maternal grandmother; Stroke in her maternal grandmother.   HOME MEDICATIONS: Allergies as of 05/24/2023       Reactions   Seldane [terfenadine] Other (See Comments)   INTERACTED WITH CHEMO DRUGS   Vancomycin Itching, Rash, Other (See Comments)   FLUSHING OF SKIN   Adhesive [tape] Itching, Other (See Comments)   SKIN REDNESS   Codeine Rash   Penicillins Rash      Peridex [chlorhexidine Gluconate] Rash, Other (See Comments)   REDNESS OF SKIN        Medication List        Accurate as of May 24, 2023 10:33 AM. If you have any questions, ask your nurse or doctor.          STOP taking these medications    losartan-hydrochlorothiazide 50-12.5 MG tablet Commonly known as: HYZAAR Stopped by: Johnney Ou Dawna Jakes       TAKE these medications    levothyroxine 112 MCG tablet Commonly known as: SYNTHROID Take 112 mcg by mouth daily.   omeprazole 40 MG capsule Commonly known as: PRILOSEC Take 40 mg by mouth daily.   verapamil 80 MG tablet Commonly known as: CALAN Take 80 mg by mouth 2 (two) times daily.   Zepbound 2.5 MG/0.5ML Pen Generic drug: tirzepatide SMARTSIG:0.5 Milliliter(s) SUB-Q Once a Week   Zepbound 5 MG/0.5ML Pen Generic drug: tirzepatide SMARTSIG:5 Milligram(s) SUB-Q Once a Week           REVIEW OF SYSTEMS: A comprehensive ROS was conducted with the patient and is negative except as per HPI     OBJECTIVE:  VS: BP 128/82 (BP Location: Left Arm, Patient Position: Sitting, Cuff Size: Large)   Pulse 76   Ht 5\' 3"  (1.6 m)   Wt 194 lb (88 kg)   SpO2 99%   BMI 34.37 kg/m    Wt Readings from Last 3 Encounters:  05/24/23 194 lb (88 kg)  10/02/22 192 lb 6.4 oz (87.3 kg)  09/12/22 193 lb 8 oz (87.8 kg)     EXAM: General: Pt appears well and is in NAD  Neck: General: Supple without adenopathy. Thyroid:No goiter or nodules appreciated  Lungs: Clear with good BS bilat   Heart: Auscultation: RRR.  Abdomen: Soft, nontender, violaceous, thin abdominal striae  Extremities:  BL LE: No pretibial edema   Mental Status: Judgment, insight: Intact Orientation: Oriented to time, place, and person Mood  and affect: No depression, anxiety, or agitation     DATA REVIEWED: ***    ASSESSMENT/PLAN/RECOMMENDATIONS:   Hypothyroidism:  -Historically, patient with fluctuating TFTs - Pt educated extensively on the correct way to take levothyroxine (first thing in the morning with water, 30 minutes before eating or taking other medications). - Pt encouraged to double dose the following day if she were to miss a dose given long half-life of levothyroxine.   Medications : Levothyroxine 112 mcg daily    2. Secondary Amenorrhea :  -Not a candidate for estrogen therapy due to history of breast cancer -Patient to start MVI for bone health -   3. Prediabetes :  -A1c was 5.7% in 2020 -She has not been on metformin -She was prescribed Zepbound by PCP, but this became cost prohibitive -She continues to see a nutritionist -Encouraged regular exercise -Will proceed with saliva cortisol for screening for Cushing syndrome  Follow-up in 4 months   Signed electronically by: Lyndle Herrlich, MD  [facility]  [facility] [address] [place]  [address] [place], [place] [address] Phone: [phone] FAX: [phone]   CC: Dell Ponto [address] [place] [address] Phone: [phone] Fax: [phone]   Return to Endocrinology clinic as below: No future appointments.

## 2023-05-24 NOTE — Patient Instructions (Signed)
SALIVARY CORTISOL COLLECTION INSTRUCTIONS    Precautions:  Please collect sample at Midnight You will need to do this on 2 nights  No food or fluids 30 minutes prior to collection.  Do not use any creams, lotions on hands, or use steroid inhalers 24- hours prior to collection.  Wash hands carefully.  Avoid any activity that could cause your gums to bleed: including flushing of brushing your teeth.  Kit must not be used in children less than 42 years of age, or a person that is at risk for choking on collection kit.  Instructions for saliva collection:   Rinse mouth thoroughly with water and discard. Do not swallow.  Hold the Salivette at the rim of the suspended insert and remove the stopper.  Remove the swab.  Place swab under tongue until well saturated, approximately 1 minute.   Return the saturated swab to the suspended insert and close the Salivette firmly with the stopper.  Do not remove the tube holding the insert. The Salivette should be sent to the lab with the swab.   Come to the lab and leave the Salivette kit for labeling with your identifying information.   Make sure you refrigerate sample if not bringing to the lab immediately. Try to use cold packs for transportation if available.

## 2023-05-27 MED ORDER — LEVOTHYROXINE SODIUM 112 MCG PO TABS: 112.0000 ug | ORAL_TABLET | Freq: Every day | ORAL | 3 refills | Status: DC

## 2023-05-30 LAB — THYROID PEROXIDASE ANTIBODY: Thyroperoxidase Ab SerPl-aCnc: <1 [IU]/mL (ref <9)

## 2023-05-30 LAB — PROLACTIN: Prolactin: 12.4 ng/mL

## 2023-05-30 LAB — ACTH: C206 ACTH: 13 pg/mL (ref 6–50)

## 2023-05-30 LAB — ESTRADIOL: Estradiol: 20 pg/mL

## 2023-06-21 ENCOUNTER — Other Ambulatory Visit: Payer: Self-pay

## 2023-06-21 ENCOUNTER — Other Ambulatory Visit: Payer: BC Managed Care – PPO

## 2023-06-21 DIAGNOSIS — R7303 Prediabetes: Secondary | ICD-10-CM

## 2023-06-21 DIAGNOSIS — E890 Postprocedural hypothyroidism: Secondary | ICD-10-CM

## 2023-06-21 NOTE — Progress Notes (Signed)
sal

## 2023-06-29 LAB — CORTISOL, LC/MS, SALIVA, 2 SAMPLES
Cortisol Savliva Sample: <0.03 ug/dL
Cortisol, Saliva Sample: <0.03 ug/dL

## 2023-08-02 ENCOUNTER — Encounter: Payer: Self-pay | Admitting: Internal Medicine

## 2023-09-20 LAB — TSH: TSH: 0.371

## 2023-09-23 NOTE — Progress Notes (Unsigned)
 Name: Bonnie Brown  MRN/ DOB: [id number], Oct 28, 1980    Age/ Sex: 43 y.o., female    PCP: Lianne Moris, PA-C   Reason for Endocrinology Evaluation: Hypothyroidism     Date of Initial Endocrinology Evaluation: 05/24/2023    HPI: [name] is a 43 y.o. female with a past medical history of HTN, hypothyroid , pre diabetes and dyslipidemia, Hx of breast Ca ( B/L mastectomy 2017), s/p radiation  to the chest as child due to hodgkin lymphoma  (1996) . The patient presented for initial endocrinology clinic visit on 05/24/2023  for consultative assistance with her Hypothyroidism.   She was diagnosed with hypothyroidism in 2010.  S/P total thyroidectomy in 2016 secondary do MNG , benign pathology    Patient is under the care of of gynecology for early menopause.Not a candidate for estrogen therapy due to hx of breast Ca.    She has a history of prediabetes with an A1c of 5.7% in 2020 Zepbound has been prescribed by PCP but it has become cost-prohibitive   Salivary cortisol was normal 06/2023  No Fh of thyroid disease  NO fh of ovarian or breast  No Fh of clotting disorder   SUBJECTIVE:    Today (09/24/23):  Bonnie Brown is here for a follow up on hypothyroidism.   Pt has been noted with weight loss due to being on Zepbound  Has noted elevated BP last 2 weeks, associated with lightheadedness  Denies headaches Continues with hot flashes at night and brain fog and insomnia  Has appointment with Gyn next month  Has noted loose stools and diarrhea  Has noted occasional palpitation  Has noted tremors      HISTORY:  Past Medical History:  Past Medical History:  Diagnosis Date   Breast disorder    History of blood transfusion 1995   History of breast cancer 08/2015   History of chemotherapy age 63   History of Hodgkin's lymphoma age 46   Hypertension    under control with med., has been on med. since 04/2015   Hypothyroidism    Irregular  heartbeat    states due to radiation treatments - no med., has not seen cardiology since 08/2013   Migraines    Vitamin D deficiency 06/20/2016   no current med.   Past Surgical History:  Past Surgical History:  Procedure Laterality Date   BREAST LUMPECTOMY WITH RADIOACTIVE SEED LOCALIZATION Left 09/06/2015   Procedure: BREAST LUMPECTOMY WITH RADIOACTIVE SEED LOCALIZATION;  Surgeon: Claud Kelp, MD;  Location: [facility];  Service: General;  Laterality: Left;   BREAST RECONSTRUCTION WITH PLACEMENT OF TISSUE EXPANDER AND FLEX HD (ACELLULAR HYDRATED DERMIS) Bilateral 12/12/2015   Procedure: BILATERAL BREAST RECONSTRUCTION WITH PLACEMENT OF TISSUE EXPANDERS AND FLEX HD (ACELLULAR HYDRATED DERMIS);  Surgeon: Alena Bills Dillingham, DO;  Location: [facility];  Service: Plastics;  Laterality: Bilateral;   CESAREAN SECTION N/A 11/04/2012   Procedure: Primary cesarean section with delivery of baby boy at 2007. apgars 8/9.;  Surgeon: Tereso Newcomer, MD;  Location: [facility];  Service: Obstetrics;  Laterality: N/A;   CESAREAN SECTION N/A 07/30/2014   Procedure: REPEAT CESAREAN SECTION;  Surgeon: Tilda Burrow, MD;  Location: [facility];  Service: Obstetrics;  Laterality: N/A;   LIPOSUCTION Left 03/29/2016   Procedure: LIPOSUCTION;  Surgeon: Peggye Form, DO;  Location: [facility];  Service: Plastics;  Laterality: Left;   LIPOSUCTION WITH LIPOFILLING Bilateral 07/05/2016   Procedure: LIPOSUCTION  WITH LIPOFILLING FROM ABDOMEN TO BILATERAL BREAST;  Surgeon: Peggye Form, DO;  Location: [facility];  Service: Plastics;  Laterality: Bilateral;   LYMPH NODE BIOPSY  1992   MASTECTOMY W/ SENTINEL NODE BIOPSY Bilateral 12/12/2015   Procedure: LEFT TOTAL MASTECTOMY WITH LEFT AXILLARY SENTINEL LYMPH NODE BIOPSY AND RIGHT PROPHYLACTIC MASTECTOMY;  Surgeon: Claud Kelp, MD;  [facility];  Service: General;  Laterality: Bilateral;   REMOVAL OF BILATERAL TISSUE  EXPANDERS WITH PLACEMENT OF BILATERAL BREAST IMPLANTS Bilateral 03/29/2016   Procedure: REMOVAL OF BILATERAL TISSUE EXPANDERS WITH PLACEMENT OF BILATERAL SILCONE BREAST IMPLANTS;  Surgeon: Peggye Form, DO;  Location: [facility];  Service: Plastics;  Laterality: Bilateral;   TOTAL THYROIDECTOMY  05/16/2015   WISDOM TOOTH EXTRACTION      Social History:  reports that she has never smoked. She has never used smokeless tobacco. She reports that she does not drink alcohol and does not use drugs. Family History: family history includes Colon cancer in her paternal aunt; Coronary artery disease in her father, maternal grandmother, and paternal grandfather; Diabetes in her maternal grandmother; Hypertension in her brother and mother; Pancreatic cancer in her maternal grandmother; Stroke in her maternal grandmother.   HOME MEDICATIONS: Allergies as of 09/24/2023       Reactions   Seldane [terfenadine] Other (See Comments)   INTERACTED WITH CHEMO DRUGS   Vancomycin Itching, Rash, Other (See Comments)   FLUSHING OF SKIN   Adhesive [tape] Itching, Other (See Comments)   SKIN REDNESS   Codeine Rash   Penicillins Rash      Peridex [chlorhexidine Gluconate] Rash, Other (See Comments)   REDNESS OF SKIN        Medication List        Accurate as of September 24, 2023  8:24 AM. If you have any questions, ask your nurse or doctor.          levothyroxine 112 MCG tablet Commonly known as: SYNTHROID Take 1 tablet (112 mcg total) by mouth daily.   omeprazole 40 MG capsule Commonly known as: PRILOSEC Take 40 mg by mouth daily.   verapamil 80 MG tablet Commonly known as: CALAN Take 80 mg by mouth 2 (two) times daily.   Zepbound 5 MG/0.5ML Pen Generic drug: tirzepatide Inject 5 mg into the skin once a week.          REVIEW OF SYSTEMS: A comprehensive ROS was conducted with the patient and is negative except as per HPI     OBJECTIVE:  VS: BP (!) 158/92   Pulse  (!) 104   Ht 5\' 3"  (1.6 m)   Wt 176 lb (79.8 kg)   LMP 10/18/2021 (Approximate)   SpO2 98%   BMI 31.18 kg/m    Wt Readings from Last 3 Encounters:  09/24/23 176 lb (79.8 kg)  05/24/23 194 lb (88 kg)  10/02/22 192 lb 6.4 oz (87.3 kg)     EXAM: General: Pt appears well and is in NAD  Neck: General: Supple without adenopathy. Thyroid:No goiter or nodules appreciated  Lungs: Clear with good BS bilat   Heart: Auscultation: RRR.  Abdomen: Soft, nontender, violaceous, thin abdominal striae  Extremities:  BL LE: No pretibial edema   Mental Status: Judgment, insight: Intact Orientation: Oriented to time, place, and person Mood and affect: No depression, anxiety, or agitation     DATA REVIEWED:  Lab Results  Component Value Date   HGBA1C 5.7 05/24/2023   HGBA1C 5.7 (H) 08/12/2018  HGBA1C 5.3 06/19/2016   09/19/2023 TSH 0.371 (0.450-4.5)    ASSESSMENT/PLAN/RECOMMENDATIONS:   Hypothyroidism:  -Historically, patient with fluctuating TFTs -She had recent labs through [facility] which show a low TSH as above, this is partly due to weight loss -I will decrease levothyroxine as below, in the meantime she may use levothyroxine 112 mcg dose by taking half a tablet once weekly and continuing to take 1 tablet the rest of the week -I have printed [facility] orders for TFTs, to be done while on levothyroxine 100 mcg for 2 months  Medications : Stop levothyroxine 112 mcg daily Start levothyroxine 100 mcg daily    2. Prediabetes :  -A1c was 5.7% in 2020, this has trended down to 5.3% on today's labs -She has not been on metformin -She is back on Zepbound through PCPs office and has been noted with weight loss   3.  HTN:  -This is out of control -Part of this is related to uncontrolled TFTs -She is on regular verapamil 80 twice daily, I will switch to CR that way she can take it once daily -Patient to continue to follow-up with PCP for BP management  Medication  Stop  verapamil 80 mg twice daily Start verapamil 180 CR, 1 tablet at bedtime  4.  Hot flashes:  -Part of this is exacerbated by abnormal TFTs -Will defer menopausal management to GYN   Follow-up in 4 months   Signed electronically by: Lyndle Herrlich, MD  [facility]  [facility] [address] [place] [place], [place] [address] Phone: [phone] FAX: [phone]   CC: Dell Ponto [address] [place] [address] Phone: [phone] Fax: [phone]   Return to Endocrinology clinic as below: Future Appointments  Date Time Provider Department Center  09/24/2023  8:30 AM Lindzee Gouge, Konrad Dolores, MD [facility] None  11/04/2023  8:30 AM Adline Potter, NP [facility]

## 2023-09-24 ENCOUNTER — Encounter: Payer: Self-pay | Admitting: Internal Medicine

## 2023-09-24 ENCOUNTER — Ambulatory Visit (INDEPENDENT_AMBULATORY_CARE_PROVIDER_SITE_OTHER): Payer: BC Managed Care – PPO | Admitting: Internal Medicine

## 2023-09-24 VITALS — BP 158/92 | HR 104 | Ht 63.0 in | Wt 176.0 lb

## 2023-09-24 DIAGNOSIS — E890 Postprocedural hypothyroidism: Secondary | ICD-10-CM | POA: Diagnosis not present

## 2023-09-24 DIAGNOSIS — I10 Essential (primary) hypertension: Secondary | ICD-10-CM

## 2023-09-24 DIAGNOSIS — R7303 Prediabetes: Secondary | ICD-10-CM | POA: Diagnosis not present

## 2023-09-24 LAB — POCT GLYCOSYLATED HEMOGLOBIN (HGB A1C): Hemoglobin A1C: 5.3 % (ref 4.0–5.6)

## 2023-09-24 MED ORDER — LEVOTHYROXINE SODIUM 100 MCG PO TABS: 100.0000 ug | ORAL_TABLET | Freq: Every day | ORAL | 3 refills | Status: DC

## 2023-09-24 MED ORDER — VERAPAMIL HCL ER 180 MG PO TBCR: 180.0000 mg | EXTENDED_RELEASE_TABLET | Freq: Every day | ORAL | 3 refills | Status: DC

## 2023-09-24 NOTE — Patient Instructions (Addendum)
 Switch verapamil 80 mg to verapamil CR 180 mg, 1 tablet at bedtime Switch levothyroxine 100 mcg daily   Have your thyroid rechecked in 2 months at [facility]

## 2023-11-04 ENCOUNTER — Ambulatory Visit: Admitting: Adult Health

## 2023-11-04 ENCOUNTER — Encounter: Payer: Self-pay | Admitting: Adult Health

## 2023-11-04 VITALS — BP 168/89 | HR 82 | Ht 63.0 in | Wt 176.2 lb

## 2023-11-04 DIAGNOSIS — Z853 Personal history of malignant neoplasm of breast: Secondary | ICD-10-CM | POA: Diagnosis not present

## 2023-11-04 DIAGNOSIS — Z1211 Encounter for screening for malignant neoplasm of colon: Secondary | ICD-10-CM

## 2023-11-04 DIAGNOSIS — Z8571 Personal history of Hodgkin lymphoma: Secondary | ICD-10-CM

## 2023-11-04 DIAGNOSIS — Z1331 Encounter for screening for depression: Secondary | ICD-10-CM | POA: Diagnosis not present

## 2023-11-04 DIAGNOSIS — Z01419 Encounter for gynecological examination (general) (routine) without abnormal findings: Secondary | ICD-10-CM

## 2023-11-04 DIAGNOSIS — I10 Essential (primary) hypertension: Secondary | ICD-10-CM | POA: Diagnosis not present

## 2023-11-04 LAB — HEMOCCULT GUIAC POC 1CARD (OFFICE): Fecal Occult Blood, POC: NEGATIVE

## 2023-11-04 NOTE — Progress Notes (Signed)
 Patient ID: Bonnie Brown, female   DOB: [birth year], 43 y.o.   MRN: [id number] History of Present Illness: Bonnie Brown is a 43 year old white female, married, [id number] in for a well woman gyn exam. No period since June of last year and [facility] was 70.14 July 2022 and 60.7 05/24/23.  She has started having hot flashes, night sweats, moody and not sleeping well, has stress at work.  She has lost 23 lbs on Zepbound, and thyroid  has been off. She had DCIS left breast with bilateral mastectomy 12/12/15 and hx of Hodgkins lymphoma.   PCP is Fredick Jarred PA     Component Value Date/Time   DIAGPAP  11/23/2021 0912    - Negative for intraepithelial lesion or malignancy (NILM)   DIAGPAP  11/02/2019 0839    - Negative for intraepithelial lesion or malignancy (NILM)   DIAGPAP  08/12/2018 0000    NEGATIVE FOR INTRAEPITHELIAL LESIONS OR MALIGNANCY. BENIGN REACTIVE/REPARATIVE CHANGES.   HPVHIGH Negative 11/23/2021 0912   HPVHIGH Negative 11/02/2019 0839   ADEQPAP  11/23/2021 0912    Satisfactory for evaluation; transformation zone component ABSENT.   ADEQPAP  11/02/2019 0839    Satisfactory for evaluation; transformation zone component ABSENT.   ADEQPAP  08/12/2018 0000    Satisfactory for evaluation  endocervical/transformation zone component PRESENT.     Current Medications, Allergies, Past Medical History, Past Surgical History, Family History and Social History were reviewed in [employer] record.     Review of Systems: Patient denies any headaches, hearing loss, fatigue, blurred vision, shortness of breath, chest pain, abdominal pain, problems with bowel movements, urination, or intercourse. No joint pain or mood swings.  See HPI for positives.    Physical Exam:BP (!) 168/89 (BP Location: Right Arm, Patient Position: Sitting, Cuff Size: Normal)   Pulse 82   Ht 5\' 3"  (1.6 m)   Wt 176 lb 3.2 oz (79.9 kg)   LMP 10/18/2021 (Approximate)   BMI 31.21 kg/m   General:  Well  developed, well nourished, no acute distress Skin:  Warm and dry Neck:  Midline trachea, normal thyroid , good ROM, no lymphadenopathy Lungs; Clear to auscultation bilaterally Breast: surgically  absent has implants, no masses felt Cardiovascular: Regular rate and rhythm Abdomen:  Soft, non tender, no hepatosplenomegaly Pelvic:  External genitalia is normal in appearance, no lesions.  The vagina is normal in appearance. Urethra has no lesions or masses. The cervix is smooth.  Uterus is felt to be normal size, shape, and contour.  No adnexal masses or tenderness noted.Bladder is non tender, no masses felt. Rectal: Good sphincter tone, no polyps, + hemorrhoids felt.  Hemoccult negative. Extremities/musculoskeletal:  No swelling or varicosities noted, no clubbing or cyanosis Psych:  No mood changes, alert and cooperative,seems happy AA is 1 Fall risk is low    11/04/2023    8:23 AM 11/23/2021    8:39 AM 11/02/2020    8:34 AM  Depression screen PHQ 2/9  Decreased Interest 0 0 0  Down, Depressed, Hopeless 0 0 0  PHQ - 2 Score 0 0 0  Altered sleeping 1 0 1  Tired, decreased energy 1 1 1   Change in appetite 0 0 0  Feeling bad or failure about yourself  0 0 0  Trouble concentrating 0 0 0  Moving slowly or fidgety/restless 0 0 0  Suicidal thoughts 0 0 0  PHQ-9 Score 2 1 2        11/04/2023    8:24 AM 11/23/2021  8:39 AM 11/02/2020    8:34 AM 11/02/2019    8:43 AM  GAD 7 : Generalized Anxiety Score  Nervous, Anxious, on Edge 0 0 0 0  Control/stop worrying 0 0 0 0  Worry too much - different things 0 0 0 0  Trouble relaxing 1 0 0 0  Restless 0 0 0 0  Easily annoyed or irritable 2 1 0 0  Afraid - awful might happen 0 0 0 0  Total GAD 7 Score 3 1 0 0    Upstream - 11/04/23 0824       Pregnancy Intention Screening   Does the patient want to become pregnant in the next year? No    Does the patient's partner want to become pregnant in the next year? No    Would the patient like to  discuss contraceptive options today? No      Contraception Wrap Up   Current Method No Method - Other Reason    Reason for No Current Contraceptive Method at Intake ([facility] Only) Other   postmenopause   End Method No Method - Other Reason    Contraception Counseling Provided No              Examination chaperoned by Lorean Rodes LPN  Impression and plan: 1. Encounter for well woman exam with routine gynecological exam (Primary) Physical and pap in 1 year Labs with Endocrinologist   2. Encounter for screening fecal occult blood testing Hemoccult was negative  - POCT occult blood stool  3. History of breast cancer Sp bilateral mastectomy with implants   4. History of Hodgkin's disease  5. Hypertension, unspecified type Go home and take BP meds, follow up with PCP

## 2023-12-23 LAB — TSH+FREE T4
Free T4: 1.26 ng/dL
TSH: 1.87 (ref 0.41–5.90)

## 2023-12-25 ENCOUNTER — Ambulatory Visit: Payer: Self-pay | Admitting: Internal Medicine

## 2024-01-28 ENCOUNTER — Ambulatory Visit (INDEPENDENT_AMBULATORY_CARE_PROVIDER_SITE_OTHER): Admitting: Internal Medicine

## 2024-01-28 VITALS — BP 120/82 | HR 88 | Ht 63.0 in | Wt 172.0 lb

## 2024-01-28 DIAGNOSIS — I10 Essential (primary) hypertension: Secondary | ICD-10-CM

## 2024-01-28 DIAGNOSIS — E890 Postprocedural hypothyroidism: Secondary | ICD-10-CM | POA: Diagnosis not present

## 2024-01-28 MED ORDER — LEVOTHYROXINE SODIUM 100 MCG PO TABS: 100.0000 ug | ORAL_TABLET | Freq: Every day | ORAL | 3 refills | Status: DC

## 2024-01-28 MED ORDER — VERAPAMIL HCL ER 180 MG PO TBCR: 180.0000 mg | EXTENDED_RELEASE_TABLET | Freq: Every day | ORAL | 3 refills | Status: DC

## 2024-01-28 NOTE — Patient Instructions (Signed)

## 2024-01-28 NOTE — Progress Notes (Signed)
 Name: Bonnie Brown  MRN/ DOB: [id number], 24-Aug-1980    Age/ Sex: 43 y.o., female    PCP: Dow Longs, PA-C   Reason for Endocrinology Evaluation: Hypothyroidism     Date of Initial Endocrinology Evaluation: 05/24/2023    HPI: [name] is a 43 y.o. female with a past medical history of HTN, hypothyroid , pre diabetes and dyslipidemia, Hx of breast Ca ( B/L mastectomy 2017), s/p radiation  to the chest as child due to hodgkin lymphoma  (1996) . The patient presented for initial endocrinology clinic visit on 05/24/2023  for consultative assistance with her Hypothyroidism.   She was diagnosed with hypothyroidism in 2010.  S/P total thyroidectomy in 2016 secondary do MNG , benign pathology    Patient is under the care of of gynecology for early menopause.Not a candidate for estrogen therapy due to hx of breast Ca.    She has a history of prediabetes with an A1c of 5.7% in 2020 Zepbound has been prescribed by PCP    Salivary cortisol was normal 06/2023  No Fh of thyroid  disease  NO fh of ovarian or breast  No Fh of clotting disorder   SUBJECTIVE:    Today (01/28/24):  Bonnie Brown is here for a follow up on hypothyroidism.   She has been on Zepbound thorugh  PCPs office for weight management and prediabetes She had a physical through GYN 10/2023   Denies local neck swelling  Denies constipating or diarrhea  Denies palpitations  She is MVI Denies Chest pain or SOB   HOME MEDICATIONS:  Levothyroxine  100 mcg daily  Verapamil  180 mg CR daily    HISTORY:  Past Medical History:  Past Medical History:  Diagnosis Date   Breast disorder    History of blood transfusion 1995   History of breast cancer 08/2015   History of chemotherapy age 48   History of Hodgkin's lymphoma age 30   Hypertension    under control with med., has been on med. since 04/2015   Hypothyroidism    Irregular heartbeat    states due to radiation treatments - no  med., has not seen cardiology since 08/2013   Migraines    Vitamin D  deficiency 06/20/2016   no current med.   Past Surgical History:  Past Surgical History:  Procedure Laterality Date   BREAST LUMPECTOMY WITH RADIOACTIVE SEED LOCALIZATION Left 09/06/2015   Procedure: BREAST LUMPECTOMY WITH RADIOACTIVE SEED LOCALIZATION;  Surgeon: Elon Pacini, MD;  Location: [facility];  Service: General;  Laterality: Left;   BREAST RECONSTRUCTION WITH PLACEMENT OF TISSUE EXPANDER AND FLEX HD (ACELLULAR HYDRATED DERMIS) Bilateral 12/12/2015   Procedure: BILATERAL BREAST RECONSTRUCTION WITH PLACEMENT OF TISSUE EXPANDERS AND FLEX HD (ACELLULAR HYDRATED DERMIS);  Surgeon: Estefana RAMAN Dillingham, DO;  [facility];  Service: Plastics;  Laterality: Bilateral;   CESAREAN SECTION N/A 11/04/2012   Procedure: Primary cesarean section with delivery of baby boy at 2007. apgars 8/9.;  Surgeon: Gloris DELENA Hugger, MD;  Location: [facility];  Service: Obstetrics;  Laterality: N/A;   CESAREAN SECTION N/A 07/30/2014   Procedure: REPEAT CESAREAN SECTION;  Surgeon: Norleen Edsel GAILS, MD;  Location: [facility];  Service: Obstetrics;  Laterality: N/A;   LIPOSUCTION Left 03/29/2016   Procedure: LIPOSUCTION;  Surgeon: Estefana RAMAN Fritter, DO;  Location: [facility];  Service: Plastics;  Laterality: Left;   LIPOSUCTION WITH LIPOFILLING Bilateral 07/05/2016   Procedure: LIPOSUCTION WITH LIPOFILLING FROM ABDOMEN TO BILATERAL BREAST;  Surgeon: Estefana GORMAN Fritter, DO;  Location: [facility];  Service: Plastics;  Laterality: Bilateral;   LYMPH NODE BIOPSY  1992   MASTECTOMY W/ SENTINEL NODE BIOPSY Bilateral 12/12/2015   Procedure: LEFT TOTAL MASTECTOMY WITH LEFT AXILLARY SENTINEL LYMPH NODE BIOPSY AND RIGHT PROPHYLACTIC MASTECTOMY;  Surgeon: Elon Pacini, MD;  [facility];  Service: General;  Laterality: Bilateral;   REMOVAL OF BILATERAL TISSUE EXPANDERS WITH PLACEMENT OF BILATERAL BREAST IMPLANTS  Bilateral 03/29/2016   Procedure: REMOVAL OF BILATERAL TISSUE EXPANDERS WITH PLACEMENT OF BILATERAL SILCONE BREAST IMPLANTS;  Surgeon: Estefana GORMAN Fritter, DO;  Location: [facility];  Service: Plastics;  Laterality: Bilateral;   TOTAL THYROIDECTOMY  05/16/2015   WISDOM TOOTH EXTRACTION      Social History:  reports that she has never smoked. She has never used smokeless tobacco. She reports that she does not drink alcohol and does not use drugs. Family History: family history includes Colon cancer in her paternal aunt; Coronary artery disease in her father, maternal grandmother, and paternal grandfather; Diabetes in her maternal grandmother; Hypertension in her brother and mother; Pancreatic cancer in her maternal grandmother; Stroke in her maternal grandmother.   HOME MEDICATIONS: Allergies as of 01/28/2024       Reactions   Seldane [terfenadine] Other (See Comments)   INTERACTED WITH CHEMO DRUGS   Vancomycin  Itching, Rash, Other (See Comments)   FLUSHING OF SKIN   Adhesive [tape] Itching, Other (See Comments)   SKIN REDNESS   Codeine Rash   Penicillins Rash      Peridex  [chlorhexidine  Gluconate] Rash, Other (See Comments)   REDNESS OF SKIN        Medication List        Accurate as of January 28, 2024  8:36 AM. If you have any questions, ask your nurse or doctor.          levothyroxine  100 MCG tablet Commonly known as: SYNTHROID  Take 1 tablet (100 mcg total) by mouth daily.   omeprazole 40 MG capsule Commonly known as: PRILOSEC Take 40 mg by mouth daily.   verapamil  180 MG CR tablet Commonly known as: CALAN -SR Take 1 tablet (180 mg total) by mouth at bedtime.   Zepbound 5 MG/0.5ML Pen Generic drug: tirzepatide Inject 5 mg into the skin once a week.   Zepbound 7.5 MG/0.5ML Pen Generic drug: tirzepatide Inject 7.5 mg into the skin once a week.          REVIEW OF SYSTEMS: A comprehensive ROS was conducted with the patient and is negative except  as per HPI     OBJECTIVE:  VS: BP 120/82 (BP Location: Left Arm, Patient Position: Sitting, Cuff Size: Normal)   Pulse 88   Ht 5' 3 (1.6 m)   Wt 172 lb (78 kg)   LMP 10/18/2021 (Approximate)   SpO2 98%   BMI 30.47 kg/m    Wt Readings from Last 3 Encounters:  01/28/24 172 lb (78 kg)  11/04/23 176 lb 3.2 oz (79.9 kg)  09/24/23 176 lb (79.8 kg)     EXAM: General: Pt appears well and is in NAD  Neck: General: Supple without adenopathy. Thyroid :No goiter or nodules appreciated  Lungs: Clear with good BS bilat   Heart: Auscultation: RRR.  Abdomen: Soft, nontender  Extremities:  BL LE: No pretibial edema   Mental Status: Judgment, insight: Intact Orientation: Oriented to time, place, and person Mood and affect: No depression, anxiety, or agitation     DATA REVIEWED:  Lab  Results  Component Value Date   HGBA1C 5.3 09/24/2023   HGBA1C 5.7 05/24/2023   HGBA1C 5.7 (H) 08/12/2018    Latest Reference Range & Units 12/23/23 14:01  TSH 0.41 - 5.90  1.87 (E)  T4,Free(Direct) ng/dL 8.73 (E)  (E): External lab result    ASSESSMENT/PLAN/RECOMMENDATIONS:   Hypothyroidism:  -Historically, patient with fluctuating TFTs - Patient is clinically and biochemically euthyroid - Recent TFTs were normal, no change  Medications : Continue levothyroxine  100 mcg daily   2.  HTN:  -I had increased verapamil  from 80 mg twice daily to verapamil  180 mg CR due to out-of-control BP and because she has been taking the regular verapamil  once daily rather than twice daily. -Saliva cortisol has been normal x 2 06/2023 - BP normal today, no change  Medication Continue verapamil  180 CR, 1 tablet at bedtime     Follow-up in 6 months   Signed electronically by: Stefano Redgie Butts, MD  [facility] Endocrinology  [facility] [address] [place] [place], [place] [address] Phone: [phone] FAX: [phone]   CC: Dow Rocky RIGGERS [address] [place]  [place] [address] Phone: [phone] Fax: [phone]   Return to Endocrinology clinic as below: No future appointments.

## 2024-03-30 ENCOUNTER — Other Ambulatory Visit (HOSPITAL_BASED_OUTPATIENT_CLINIC_OR_DEPARTMENT_OTHER): Payer: Self-pay

## 2024-03-30 MED ORDER — ZEPBOUND 10 MG/0.5ML ~~LOC~~ SOAJ
10.0000 mg | SUBCUTANEOUS | 2 refills | Status: DC
  Filled 2024-03-30: qty 2, 28d supply, fill #0

## 2024-04-01 ENCOUNTER — Other Ambulatory Visit (HOSPITAL_BASED_OUTPATIENT_CLINIC_OR_DEPARTMENT_OTHER): Payer: Self-pay

## 2024-04-01 MED ORDER — ONDANSETRON 4 MG PO TBDP: 4.0000 mg | ORAL_TABLET | Freq: Three times a day (TID) | ORAL | 2 refills | Status: DC

## 2024-04-01 MED ORDER — TIRZEPATIDE-WEIGHT MANAGEMENT 5 MG/0.5ML ~~LOC~~ SOAJ: 5.0000 mg | SUBCUTANEOUS | 0 refills | Status: DC

## 2024-04-01 MED ORDER — FAMOTIDINE 40 MG PO TABS: 40.0000 mg | ORAL_TABLET | Freq: Every day | ORAL | 5 refills | Status: DC

## 2024-04-01 MED ORDER — LEVOTHYROXINE SODIUM 100 MCG PO TABS
100.0000 ug | ORAL_TABLET | Freq: Every day | ORAL | 3 refills | Status: DC
  Filled 2024-04-01: qty 90, 90d supply, fill #0
  Filled 2024-06-29: qty 90, 90d supply, fill #1

## 2024-04-01 MED ORDER — OMEPRAZOLE 40 MG PO CPDR: 40.0000 mg | DELAYED_RELEASE_CAPSULE | Freq: Every day | ORAL | 5 refills | Status: DC

## 2024-04-01 MED ORDER — TIRZEPATIDE-WEIGHT MANAGEMENT 7.5 MG/0.5ML ~~LOC~~ SOAJ: 7.5000 mg | SUBCUTANEOUS | 5 refills | Status: DC

## 2024-04-01 MED ORDER — VERAPAMIL HCL ER 180 MG PO TBCR: 180.0000 mg | EXTENDED_RELEASE_TABLET | Freq: Every evening | ORAL | 3 refills | Status: DC

## 2024-04-01 MED ORDER — VERAPAMIL HCL 80 MG PO TABS: 80.0000 mg | ORAL_TABLET | Freq: Two times a day (BID) | ORAL | 5 refills | Status: DC

## 2024-04-15 ENCOUNTER — Other Ambulatory Visit (HOSPITAL_BASED_OUTPATIENT_CLINIC_OR_DEPARTMENT_OTHER): Payer: Self-pay

## 2024-04-15 MED ORDER — ONDANSETRON 4 MG PO TBDP
4.0000 mg | ORAL_TABLET | Freq: Three times a day (TID) | ORAL | 2 refills | Status: AC | PRN
  Filled 2024-04-15: qty 15, 5d supply, fill #0

## 2024-04-15 MED ORDER — OMEPRAZOLE 40 MG PO CPDR
40.0000 mg | DELAYED_RELEASE_CAPSULE | Freq: Every day | ORAL | 5 refills | Status: AC
  Filled 2024-04-15: qty 30, 30d supply, fill #0
  Filled 2024-08-04: qty 30, 30d supply, fill #1

## 2024-05-05 ENCOUNTER — Other Ambulatory Visit (HOSPITAL_BASED_OUTPATIENT_CLINIC_OR_DEPARTMENT_OTHER): Payer: Self-pay

## 2024-05-05 MED ORDER — ZEPBOUND 10 MG/0.5ML ~~LOC~~ SOAJ
10.0000 mg | SUBCUTANEOUS | 2 refills | Status: AC
  Filled 2024-05-05 – 2024-05-12 (×2): qty 2, 28d supply, fill #0

## 2024-05-05 MED ORDER — FAMOTIDINE 40 MG PO TABS
40.0000 mg | ORAL_TABLET | Freq: Every day | ORAL | 5 refills | Status: AC
  Filled 2024-05-05: qty 30, 30d supply, fill #0
  Filled 2024-08-04: qty 30, 30d supply, fill #1
  Filled 2024-08-05: qty 20, 20d supply, fill #1

## 2024-05-06 ENCOUNTER — Other Ambulatory Visit (HOSPITAL_BASED_OUTPATIENT_CLINIC_OR_DEPARTMENT_OTHER): Payer: Self-pay

## 2024-05-12 ENCOUNTER — Encounter: Payer: Self-pay | Admitting: Plastic Surgery

## 2024-05-12 ENCOUNTER — Ambulatory Visit (INDEPENDENT_AMBULATORY_CARE_PROVIDER_SITE_OTHER): Admitting: Plastic Surgery

## 2024-05-12 ENCOUNTER — Other Ambulatory Visit (HOSPITAL_BASED_OUTPATIENT_CLINIC_OR_DEPARTMENT_OTHER): Payer: Self-pay

## 2024-05-12 VITALS — BP 144/97 | HR 93 | Ht 63.0 in | Wt 177.6 lb

## 2024-05-12 DIAGNOSIS — Z9013 Acquired absence of bilateral breasts and nipples: Secondary | ICD-10-CM | POA: Diagnosis not present

## 2024-05-12 DIAGNOSIS — Z923 Personal history of irradiation: Secondary | ICD-10-CM

## 2024-05-12 DIAGNOSIS — Z9889 Other specified postprocedural states: Secondary | ICD-10-CM

## 2024-05-12 DIAGNOSIS — N6459 Other signs and symptoms in breast: Secondary | ICD-10-CM | POA: Diagnosis not present

## 2024-05-12 NOTE — Progress Notes (Signed)
 Patient ID: Bonnie Brown, female    DOB: 04-04-1981, 43 y.o.   MRN: [id number]   Chief Complaint  Patient presents with   Consult     Consult for an implant flip, patient t    The patient is a 43 year old female here for evaluation of her implant on the left.  The patient was seen in 2017 for concerns of breast cancer.  She was radiated at the age of 9 for Hodgkin's disease in 72.  She underwent bilateral mastectomies with expander placement in June 2017.  Then in September 2017 she had [place]or smooth round ultra high-profile 455 cc implants placed.  She had lipo filling for symmetry in December 2017.  Today she has concerns about the left implant.  She thinks that it feels a little different and a little funny.  She is also trying to lose some weight so she understands this may be contributing to it.  But she is concerned about the difference and the way that it feels a little bit different.    Review of Systems  Constitutional: Negative.   Eyes: Negative.   Respiratory: Negative.    Cardiovascular: Negative.   Gastrointestinal: Negative.   Endocrine: Negative.   Genitourinary: Negative.   Musculoskeletal: Negative.     Past Medical History:  Diagnosis Date   Breast disorder    History of blood transfusion 1995   History of breast cancer 08/2015   History of chemotherapy age 50   History of Hodgkin's lymphoma age 76   Hypertension    under control with med., has been on med. since 04/2015   Hypothyroidism    Irregular heartbeat    states due to radiation treatments - no med., has not seen cardiology since 08/2013   Migraines    Vitamin D  deficiency 06/20/2016   no current med.    Past Surgical History:  Procedure Laterality Date   BREAST LUMPECTOMY WITH RADIOACTIVE SEED LOCALIZATION Left 09/06/2015   Procedure: BREAST LUMPECTOMY WITH RADIOACTIVE SEED LOCALIZATION;  Surgeon: Elon Pacini, MD;  Location: [facility];  Service: General;  Laterality:  Left;   BREAST RECONSTRUCTION WITH PLACEMENT OF TISSUE EXPANDER AND FLEX HD (ACELLULAR HYDRATED DERMIS) Bilateral 12/12/2015   Procedure: BILATERAL BREAST RECONSTRUCTION WITH PLACEMENT OF TISSUE EXPANDERS AND FLEX HD (ACELLULAR HYDRATED DERMIS);  Surgeon: Estefana RAMAN Kaiyon Hynes, DO;  [facility];  Service: Plastics;  Laterality: Bilateral;   CESAREAN SECTION N/A 11/04/2012   Procedure: Primary cesarean section with delivery of baby boy at 2007. apgars 8/9.;  Surgeon: Gloris DELENA Hugger, MD;  [facility];  Service: Obstetrics;  Laterality: N/A;   CESAREAN SECTION N/A 07/30/2014   Procedure: REPEAT CESAREAN SECTION;  Surgeon: Norleen Edsel GAILS, MD;  Location: [facility];  Service: Obstetrics;  Laterality: N/A;   LIPOSUCTION Left 03/29/2016   Procedure: LIPOSUCTION;  Surgeon: Estefana RAMAN Fritter, DO;  Location: [facility];  Service: Plastics;  Laterality: Left;   LIPOSUCTION WITH LIPOFILLING Bilateral 07/05/2016   Procedure: LIPOSUCTION WITH LIPOFILLING FROM ABDOMEN TO BILATERAL BREAST;  Surgeon: Estefana RAMAN Fritter, DO;  Location: [facility];  Service: Plastics;  Laterality: Bilateral;   LYMPH NODE BIOPSY  1992   MASTECTOMY W/ SENTINEL NODE BIOPSY Bilateral 12/12/2015   Procedure: LEFT TOTAL MASTECTOMY WITH LEFT AXILLARY SENTINEL LYMPH NODE BIOPSY AND RIGHT PROPHYLACTIC MASTECTOMY;  Surgeon: Elon Pacini, MD;  [facility];  Service: General;  Laterality: Bilateral;   REMOVAL OF BILATERAL TISSUE EXPANDERS WITH  PLACEMENT OF BILATERAL BREAST IMPLANTS Bilateral 03/29/2016   Procedure: REMOVAL OF BILATERAL TISSUE EXPANDERS WITH PLACEMENT OF BILATERAL SILCONE BREAST IMPLANTS;  Surgeon: Estefana GORMAN Fritter, DO;  Location: [facility];  Service: Plastics;  Laterality: Bilateral;   TOTAL THYROIDECTOMY  05/16/2015   WISDOM TOOTH EXTRACTION        Current Outpatient Medications:    famotidine  (PEPCID ) 40 MG tablet, Take 1 tablet (40 mg total) by mouth daily., Disp: 30  tablet, Rfl: 5   famotidine  (PEPCID ) 40 MG tablet, Take 1 tablet (40 mg total) by mouth daily., Disp: 30 tablet, Rfl: 5   levothyroxine  (SYNTHROID ) 100 MCG tablet, Take 1 tablet (100 mcg total) by mouth daily., Disp: 90 tablet, Rfl: 3   levothyroxine  (SYNTHROID ) 100 MCG tablet, Take 1 tablet (100 mcg total) by mouth daily., Disp: 90 tablet, Rfl: 3   omeprazole  (PRILOSEC) 40 MG capsule, Take 40 mg by mouth daily., Disp: , Rfl:    omeprazole  (PRILOSEC) 40 MG capsule, Take 1 capsule (40 mg total) by mouth daily., Disp: 30 capsule, Rfl: 5   omeprazole  (PRILOSEC) 40 MG capsule, Take 1 capsule (40 mg total) by mouth daily., Disp: 30 capsule, Rfl: 5   ondansetron  (ZOFRAN -ODT) 4 MG disintegrating tablet, Dissolve 1 tablet (4 mg total) on top of the tongue 3 (three) times daily as needed for nausea., Disp: 15 tablet, Rfl: 2   ondansetron  (ZOFRAN -ODT) 4 MG disintegrating tablet, Take 1 tablet (4 mg total) by mouth 3 (three) times daily as needed for nausea., Disp: 15 tablet, Rfl: 2   tirzepatide  (ZEPBOUND ) 10 MG/0.5ML Pen, Inject 10 mg into the skin once a week., Disp: 2 mL, Rfl: 2   tirzepatide  (ZEPBOUND ) 10 MG/0.5ML Pen, Inject 10 mg into the skin once a week., Disp: 2 mL, Rfl: 2   tirzepatide  (ZEPBOUND ) 7.5 MG/0.5ML Pen, Inject 7.5 mg into the skin once a week., Disp: 2 mL, Rfl: 5   verapamil  (CALAN -SR) 180 MG CR tablet, Take 1 tablet (180 mg total) by mouth at bedtime., Disp: 90 tablet, Rfl: 3   verapamil  (CALAN -SR) 180 MG CR tablet, Take 1 tablet (180 mg total) by mouth at bedtime., Disp: 90 tablet, Rfl: 3   ZEPBOUND  7.5 MG/0.5ML Pen, Inject 7.5 mg into the skin once a week., Disp: , Rfl:    Objective:   Vitals:   05/12/24 0848  BP: (!) 144/97  Pulse: 93  SpO2: 100%    Physical Exam Vitals reviewed.  Constitutional:      Appearance: Normal appearance.  HENT:     Head: Atraumatic.  Cardiovascular:     Rate and Rhythm: Normal rate.     Pulses: Normal pulses.  Pulmonary:     Effort:  Pulmonary effort is normal.  Skin:    General: Skin is warm.     Capillary Refill: Capillary refill takes less than 2 seconds.  Neurological:     Mental Status: She is alert and oriented to person, place, and time.  Psychiatric:        Mood and Affect: Mood normal.        Behavior: Behavior normal.        Thought Content: Thought content normal.        Judgment: Judgment normal.     Assessment & Plan:  Status post bilateral breast reconstruction  Given the patient's history is perfectly reasonable to move towards ultrasound of the left breast for evaluation.  The patient is almost 10 years from the time of her implant  placement.  Will plan to get pictures at her next visit.  I will order an ultrasound.  Estefana RAMAN Franz Svec, DO

## 2024-05-21 ENCOUNTER — Ambulatory Visit
Admission: RE | Admit: 2024-05-21 | Discharge: 2024-05-21 | Disposition: A | Discharge status: Home / Self Care | Source: Ambulatory Visit | Attending: Plastic Surgery | Admitting: Plastic Surgery

## 2024-05-21 DIAGNOSIS — Z9889 Other specified postprocedural states: Secondary | ICD-10-CM

## 2024-05-25 ENCOUNTER — Other Ambulatory Visit (HOSPITAL_BASED_OUTPATIENT_CLINIC_OR_DEPARTMENT_OTHER): Payer: Self-pay

## 2024-06-09 ENCOUNTER — Other Ambulatory Visit (HOSPITAL_BASED_OUTPATIENT_CLINIC_OR_DEPARTMENT_OTHER): Payer: Self-pay

## 2024-06-09 MED ORDER — NITROFURANTOIN MONOHYD MACRO 100 MG PO CAPS
100.0000 mg | ORAL_CAPSULE | Freq: Two times a day (BID) | ORAL | 0 refills | Status: AC
  Filled 2024-06-09: qty 10, 5d supply, fill #0

## 2024-06-19 ENCOUNTER — Other Ambulatory Visit (HOSPITAL_BASED_OUTPATIENT_CLINIC_OR_DEPARTMENT_OTHER): Payer: Self-pay

## 2024-06-29 ENCOUNTER — Other Ambulatory Visit (HOSPITAL_BASED_OUTPATIENT_CLINIC_OR_DEPARTMENT_OTHER): Payer: Self-pay

## 2024-07-29 ENCOUNTER — Ambulatory Visit: Admitting: Internal Medicine

## 2024-07-29 ENCOUNTER — Encounter: Payer: Self-pay | Admitting: Internal Medicine

## 2024-07-29 ENCOUNTER — Other Ambulatory Visit (HOSPITAL_BASED_OUTPATIENT_CLINIC_OR_DEPARTMENT_OTHER): Payer: Self-pay

## 2024-07-29 ENCOUNTER — Other Ambulatory Visit

## 2024-07-29 VITALS — BP 136/86 | HR 101 | Ht 63.0 in | Wt 174.0 lb

## 2024-07-29 DIAGNOSIS — E890 Postprocedural hypothyroidism: Secondary | ICD-10-CM

## 2024-07-29 DIAGNOSIS — I10 Essential (primary) hypertension: Secondary | ICD-10-CM

## 2024-07-29 MED ORDER — VERAPAMIL HCL ER 180 MG PO TBCR
180.0000 mg | EXTENDED_RELEASE_TABLET | Freq: Every evening | ORAL | 3 refills | Status: AC
  Filled 2024-07-29: qty 90, 90d supply, fill #0

## 2024-07-29 NOTE — Patient Instructions (Signed)

## 2024-07-29 NOTE — Progress Notes (Unsigned)
 "   Name: Bonnie Brown  MRN/ DOB: [id number], [birth year]    Age/ Sex: 44 y.o., female    PCP: Dow Longs, PA-C   Reason for Endocrinology Evaluation: Hypothyroidism     Date of Initial Endocrinology Evaluation: 05/24/2023    HPI: [name] is a 44 y.o. female with a past medical history of HTN, hypothyroid , pre diabetes and dyslipidemia, Hx of breast Ca ( B/L mastectomy 2017), s/p radiation  to the chest as child due to hodgkin lymphoma  (1996) . The patient presented for initial endocrinology clinic visit on 05/24/2023  for consultative assistance with her Hypothyroidism.   She was diagnosed with hypothyroidism in 2010.  S/P total thyroidectomy in 2016 secondary do MNG , benign pathology    Patient is under the care of of gynecology for early menopause.Not a candidate for estrogen therapy due to hx of breast Ca.    She has a history of prediabetes with an A1c of 5.7% in 2020 Zepbound  has been prescribed by PCP    Salivary cortisol was normal 06/2023  No Fh of thyroid  disease  NO fh of ovarian or breast  No Fh of clotting disorder   SUBJECTIVE:    Today (07/29/24):  Bonnie Brown is here for a follow up on hypothyroidism.    Weight remains stable No local neck swelling  BP optimal at home  No headaches  No constipation or diarrhea  No palpitations  NO LE edema    HOME MEDICATIONS:  Levothyroxine  100 mcg daily  Verapamil  180 mg CR daily    HISTORY:  Past Medical History:  Past Medical History:  Diagnosis Date   Breast disorder    History of blood transfusion 1995   History of breast cancer 08/2015   History of chemotherapy age 71   History of Hodgkin's lymphoma age 25   Hypertension    under control with med., has been on med. since 04/2015   Hypothyroidism    Irregular heartbeat    states due to radiation treatments - no med., has not seen cardiology since 08/2013   Migraines    Vitamin D  deficiency 06/20/2016   no current  med.   Past Surgical History:  Past Surgical History:  Procedure Laterality Date   BREAST LUMPECTOMY WITH RADIOACTIVE SEED LOCALIZATION Left 09/06/2015   Procedure: BREAST LUMPECTOMY WITH RADIOACTIVE SEED LOCALIZATION;  Surgeon: Elon Pacini, MD;  Location: [facility];  Service: General;  Laterality: Left;   BREAST RECONSTRUCTION WITH PLACEMENT OF TISSUE EXPANDER AND FLEX HD (ACELLULAR HYDRATED DERMIS) Bilateral 12/12/2015   Procedure: BILATERAL BREAST RECONSTRUCTION WITH PLACEMENT OF TISSUE EXPANDERS AND FLEX HD (ACELLULAR HYDRATED DERMIS);  Surgeon: Estefana RAMAN Dillingham, DO;  [facility];  Service: Plastics;  Laterality: Bilateral;   CESAREAN SECTION N/A 11/04/2012   Procedure: Primary cesarean section with delivery of baby boy at 2007. apgars 8/9.;  Surgeon: Gloris DELENA Hugger, MD;  [facility];  Service: Obstetrics;  Laterality: N/A;   CESAREAN SECTION N/A 07/30/2014   Procedure: REPEAT CESAREAN SECTION;  Surgeon: Norleen Edsel GAILS, MD;  [facility];  Service: Obstetrics;  Laterality: N/A;   LIPOSUCTION Left 03/29/2016   Procedure: LIPOSUCTION;  Surgeon: Estefana RAMAN Fritter, DO;  Location: [facility];  Service: Plastics;  Laterality: Left;   LIPOSUCTION WITH LIPOFILLING Bilateral 07/05/2016   Procedure: LIPOSUCTION WITH LIPOFILLING FROM ABDOMEN TO BILATERAL BREAST;  Surgeon: Estefana RAMAN Fritter, DO;  Location: [facility];  Service:  Plastics;  Laterality: Bilateral;   LYMPH NODE BIOPSY  1992   MASTECTOMY W/ SENTINEL NODE BIOPSY Bilateral 12/12/2015   Procedure: LEFT TOTAL MASTECTOMY WITH LEFT AXILLARY SENTINEL LYMPH NODE BIOPSY AND RIGHT PROPHYLACTIC MASTECTOMY;  Surgeon: Elon Pacini, MD;  [facility];  Service: General;  Laterality: Bilateral;   REMOVAL OF BILATERAL TISSUE EXPANDERS WITH PLACEMENT OF BILATERAL BREAST IMPLANTS Bilateral 03/29/2016   Procedure: REMOVAL OF BILATERAL TISSUE EXPANDERS WITH PLACEMENT OF BILATERAL SILCONE BREAST  IMPLANTS;  Surgeon: Estefana GORMAN Fritter, DO;  Location: [facility];  Service: Plastics;  Laterality: Bilateral;   TOTAL THYROIDECTOMY  05/16/2015   WISDOM TOOTH EXTRACTION      Social History:  reports that she has never smoked. She has never used smokeless tobacco. She reports that she does not drink alcohol and does not use drugs. Family History: family history includes Colon cancer in her paternal aunt; Coronary artery disease in her father, maternal grandmother, and paternal grandfather; Diabetes in her maternal grandmother; Hypertension in her brother and mother; Pancreatic cancer in her maternal grandmother; Stroke in her maternal grandmother.   HOME MEDICATIONS: Allergies as of 07/29/2024       Reactions   Seldane [terfenadine] Other (See Comments)   INTERACTED WITH CHEMO DRUGS   Vancomycin  Itching, Rash, Other (See Comments)   FLUSHING OF SKIN   Adhesive [tape] Itching, Other (See Comments)   SKIN REDNESS   Codeine Rash   Penicillins Rash      Peridex  [chlorhexidine  Gluconate] Rash, Other (See Comments)   REDNESS OF SKIN        Medication List        Accurate as of July 29, 2024  8:29 AM. If you have any questions, ask your nurse or doctor.          famotidine  40 MG tablet Commonly known as: Pepcid  Take 1 tablet (40 mg total) by mouth daily. What changed: Another medication with the same name was removed. Continue taking this medication, and follow the directions you see here. Changed by: Donell Butts, MD   levothyroxine  100 MCG tablet Commonly known as: SYNTHROID  Take 1 tablet (100 mcg total) by mouth daily. What changed: Another medication with the same name was removed. Continue taking this medication, and follow the directions you see here. Changed by: Donell Butts, MD   omeprazole  40 MG capsule Commonly known as: PRILOSEC Take 1 capsule (40 mg total) by mouth daily. What changed: Another medication with the same name was  removed. Continue taking this medication, and follow the directions you see here. Changed by: Donell Butts, MD   ondansetron  4 MG disintegrating tablet Commonly known as: ZOFRAN -ODT Take 1 tablet (4 mg total) by mouth 3 (three) times daily as needed for nausea. What changed: Another medication with the same name was removed. Continue taking this medication, and follow the directions you see here. Changed by: Donell Butts, MD   verapamil  180 MG CR tablet Commonly known as: CALAN -SR Take 1 tablet (180 mg total) by mouth at bedtime. What changed: Another medication with the same name was removed. Continue taking this medication, and follow the directions you see here. Changed by: Donell Butts, MD   Zepbound  10 MG/0.5ML Pen Generic drug: tirzepatide  Inject 10 mg into the skin once a week. What changed: Another medication with the same name was removed. Continue taking this medication, and follow the directions you see here. Changed by: Donell Butts, MD          REVIEW OF  SYSTEMS: A comprehensive ROS was conducted with the patient and is negative except as per HPI     OBJECTIVE:  VS: BP (!) 144/90   Pulse (!) 101   Ht 5' 3 (1.6 m)   Wt 174 lb (78.9 kg)   LMP 10/18/2021   BMI 30.82 kg/m    Wt Readings from Last 3 Encounters:  07/29/24 174 lb (78.9 kg)  05/12/24 177 lb 9.6 oz (80.6 kg)  01/28/24 172 lb (78 kg)     EXAM: General: Pt appears well and is in NAD  Neck: General: Supple without adenopathy. Thyroid :No goiter or nodules appreciated  Lungs: Clear with good BS bilat   Heart: Auscultation: RRR.  Abdomen: Soft, nontender  Extremities:  BL LE: No pretibial edema   Mental Status: Judgment, insight: Intact Orientation: Oriented to time, place, and person Mood and affect: No depression, anxiety, or agitation     DATA REVIEWED:  Lab Results  Component Value Date   HGBA1C 5.3 09/24/2023   HGBA1C 5.7 05/24/2023   HGBA1C 5.7 (H)  08/12/2018    Latest Reference Range & Units 12/23/23 14:01  TSH 0.41 - 5.90  1.87 (E)  T4,Free(Direct) ng/dL 8.73 (E)  (E): External lab result    ASSESSMENT/PLAN/RECOMMENDATIONS:   Hypothyroidism:  -Historically, patient with fluctuating TFTs - Patient is clinically and biochemically euthyroid - Recent TFTs were normal, no change  Medications : Continue levothyroxine  100 mcg daily   2.  HTN:  -I had increased verapamil  from 80 mg twice daily to verapamil  180 mg CR due to out-of-control BP and because she has been taking the regular verapamil  once daily rather than twice daily. -Saliva cortisol has been normal x 2 06/2023 - BP initially elevated during this visit, repeat BP has normalized, will continue current dose  Medication Continue verapamil  180 CR, 1 tablet at bedtime     Follow-up in 6 months   Signed electronically by: Stefano Redgie Butts, MD  [facility]  [facility] [address] [place] [place], [place] [address] Phone: [phone] FAX: [phone]   CC: Dow Rocky RIGGERS [address] [place] [address] Phone: [phone] Fax: [phone]   Return to Endocrinology clinic as below: Future Appointments  Date Time Provider Department Center  07/29/2024  8:30 AM Winston Misner, Donell Redgie, MD [id number] None  12/08/2024 11:30 AM Dillingham, Estefana RAMAN, DO [facility] None           "

## 2024-07-30 ENCOUNTER — Other Ambulatory Visit (HOSPITAL_BASED_OUTPATIENT_CLINIC_OR_DEPARTMENT_OTHER): Payer: Self-pay

## 2024-07-30 ENCOUNTER — Ambulatory Visit: Payer: Self-pay | Admitting: Internal Medicine

## 2024-07-30 LAB — T4, FREE: Free T4: 1.7 ng/dL (ref 0.8–1.8)

## 2024-07-30 LAB — TSH: TSH: 0.53 m[IU]/L

## 2024-07-30 MED ORDER — LEVOTHYROXINE SODIUM 100 MCG PO TABS
100.0000 ug | ORAL_TABLET | Freq: Every day | ORAL | 3 refills | Status: AC
  Filled 2024-07-30: qty 90, 90d supply, fill #0

## 2024-08-05 ENCOUNTER — Other Ambulatory Visit (HOSPITAL_BASED_OUTPATIENT_CLINIC_OR_DEPARTMENT_OTHER): Payer: Self-pay

## 2024-12-08 ENCOUNTER — Ambulatory Visit: Admitting: Plastic Surgery

## 2025-07-29 ENCOUNTER — Ambulatory Visit: Admitting: Internal Medicine
